# Patient Record
Sex: Male | Born: 1935 | Race: Black or African American | Hispanic: No | Marital: Married | Smoking: Former smoker
Health system: Southern US, Community
[De-identification: ages and names within clinical notes are randomized; demographics above are authoritative.]

## PROBLEM LIST (undated history)

## (undated) DIAGNOSIS — I499 Cardiac arrhythmia, unspecified: Secondary | ICD-10-CM

## (undated) DIAGNOSIS — C61 Malignant neoplasm of prostate: Secondary | ICD-10-CM

## (undated) DIAGNOSIS — E119 Type 2 diabetes mellitus without complications: Secondary | ICD-10-CM

## (undated) DIAGNOSIS — K449 Diaphragmatic hernia without obstruction or gangrene: Secondary | ICD-10-CM

## (undated) DIAGNOSIS — Z950 Presence of cardiac pacemaker: Secondary | ICD-10-CM

## (undated) DIAGNOSIS — K648 Other hemorrhoids: Secondary | ICD-10-CM

## (undated) DIAGNOSIS — Z7982 Long term (current) use of aspirin: Secondary | ICD-10-CM

## (undated) DIAGNOSIS — R0600 Dyspnea, unspecified: Secondary | ICD-10-CM

## (undated) DIAGNOSIS — K219 Gastro-esophageal reflux disease without esophagitis: Secondary | ICD-10-CM

## (undated) HISTORY — PX: BACK SURGERY: SHX140

## (undated) HISTORY — DX: Other hemorrhoids: K64.8

## (undated) HISTORY — DX: Diaphragmatic hernia without obstruction or gangrene: K44.9

## (undated) HISTORY — DX: Malignant neoplasm of prostate: C61

## (undated) HISTORY — DX: Long term (current) use of aspirin: Z79.82

## (undated) HISTORY — DX: Type 2 diabetes mellitus without complications: E11.9

## (undated) HISTORY — PX: PROSTATE SURGERY: SHX751

---

## 2001-09-29 ENCOUNTER — Ambulatory Visit (HOSPITAL_COMMUNITY): Admission: RE | Admit: 2001-09-29 | Discharge: 2001-09-29 | Payer: Self-pay | Admitting: General Surgery

## 2002-04-16 ENCOUNTER — Encounter: Admission: RE | Admit: 2002-04-16 | Discharge: 2002-07-15 | Payer: Self-pay | Admitting: Family Medicine

## 2002-08-12 ENCOUNTER — Ambulatory Visit (HOSPITAL_COMMUNITY): Admission: RE | Admit: 2002-08-12 | Discharge: 2002-08-12 | Payer: Self-pay | Admitting: Family Medicine

## 2004-03-16 ENCOUNTER — Other Ambulatory Visit: Admission: RE | Admit: 2004-03-16 | Discharge: 2004-03-16 | Payer: Self-pay | Admitting: General Surgery

## 2007-10-13 DIAGNOSIS — K648 Other hemorrhoids: Secondary | ICD-10-CM

## 2007-10-13 HISTORY — PX: UPPER GASTROINTESTINAL ENDOSCOPY: SHX188

## 2007-10-13 HISTORY — PX: COLONOSCOPY: SHX174

## 2007-10-13 HISTORY — DX: Other hemorrhoids: K64.8

## 2007-10-14 ENCOUNTER — Inpatient Hospital Stay (HOSPITAL_COMMUNITY): Admission: EM | Admit: 2007-10-14 | Discharge: 2007-10-22 | Payer: Self-pay | Admitting: Emergency Medicine

## 2007-10-15 ENCOUNTER — Ambulatory Visit: Payer: Self-pay | Admitting: Gastroenterology

## 2007-10-16 ENCOUNTER — Ambulatory Visit: Payer: Self-pay | Admitting: Gastroenterology

## 2007-10-16 ENCOUNTER — Encounter: Payer: Self-pay | Admitting: Gastroenterology

## 2007-10-18 ENCOUNTER — Ambulatory Visit: Payer: Self-pay | Admitting: Internal Medicine

## 2007-10-21 ENCOUNTER — Ambulatory Visit: Payer: Self-pay | Admitting: Gastroenterology

## 2007-11-19 ENCOUNTER — Ambulatory Visit: Payer: Self-pay | Admitting: Gastroenterology

## 2009-02-25 ENCOUNTER — Emergency Department (HOSPITAL_COMMUNITY): Admission: EM | Admit: 2009-02-25 | Discharge: 2009-02-25 | Payer: Self-pay | Admitting: Emergency Medicine

## 2011-03-27 NOTE — Discharge Summary (Signed)
NAME:  Jose Chase, BAGHERI                 ACCOUNT NO.:  [id number]   MEDICAL RECORD NO.:  [id number]          PATIENT TYPE:  INP   LOCATION:  A228                          FACILITY:  [facility]   PHYSICIAN:  Tesfaye D. Felecia Shelling, MD   DATE OF BIRTH:  03/22/1932   DATE OF ADMISSION:  10/14/2007  DATE OF DISCHARGE:  12/10/2008LH                               DISCHARGE SUMMARY   DISCHARGE DIAGNOSES:  1. Post colonoscopy rectal perforation.  2. Rectal bleeding secondary to hemorrhoids.  3. Diabetes mellitus.  4. Hyperlipidemia.  5. History of carcinoma of the prostate.  6. History of diskogenic disease.   DISCHARGE MEDICATIONS:  1. Metformin 500 mg p.o. b.i.d.  2. Lovastatin 20 mg p.o. daily.  3. Omeprazole 20 mg daily.  4. Multivitamin 1 tablet daily.  5. Augmentin 500 mg p.o. b.i.d. for 4 more days.   DISPOSITION:  The patient was discharged to home in stable condition.   HOSPITAL COURSE:  This is a 77 year old male patient with history of  multiple medical illnesses who was admitted due to rectal bleed.  He was  evaluated by GI and the patient was scheduled for EGD and colonoscopy.  The patient developed rectal perforation post colonoscopy.  He was  evaluated by Surgery and was closely followed by GI.  The patient was  treated only with IV antibiotics and close observation.  His rectal  perforation gradually sealed, patient improved and he was discharged to  home on oral antibiotics, to be followed by gastroenterologist..      Tesfaye D. Felecia Shelling, MD  Electronically Signed     TDF/MEDQ  D:  11/17/2007  T:  11/17/2007  Job:  [id number]

## 2011-03-27 NOTE — H&P (Signed)
Jose Chase, Jose Chase                 ACCOUNT NO.:  [id number]   MEDICAL RECORD NO.:  [id number]          PATIENT TYPE:  INP   LOCATION:  A228                          FACILITY:  [facility]   PHYSICIAN:  Edward L. Juanetta Gosling, M.D.DATE OF BIRTH:  July 30, 1932   DATE OF ADMISSION:  10/14/2007  DATE OF DISCHARGE:  LH                              HISTORY & PHYSICAL   Patient of Dr. Felecia Shelling.   REASON FOR ADMISSION:  GI bleeding.   HISTORY:  Jose Chase is a 77 year old African-American male who has had  abdominal discomfort for 2-3 weeks.  He saw Dr. Felecia Shelling in his office,  this morning, and was prescribed Prilosec.  This afternoon he went home,  had a bowel movement that had blood in it, it was bright red blood; and  he came to the emergency room because of concerns that he was having a  significant GI bleed.  He has no known history of any sort of ulcer  disease or any other abdominal problems.  He has not had any cramping.  He has not had any fever.  He has not had any chills.  He has not had  any melena.   PAST MEDICAL HISTORY POSITIVE FOR:  1. History of diabetes mellitus.  2. Back pain requiring surgery.  3. Hyperlipidemia.  4. Cancer of the prostate.   MEDICATIONS:  1. Metformin 500 mg b.i.d.  2. Aspirin 81 mg daily.  3. Lovastatin 20 mg daily.  4. Omeprazole 20 mg daily that he just started today.  5. Multiple vitamins.   SURGICAL HISTORY IS POSITIVE FOR:  1. Hernia repair.  2. TURP.  3. Cancer of the prostate.  4. Back surgery.   SOCIAL HISTORY:  He is married, lives at home with his family.  He does  not use any illicit drugs.  He does not drink any alcohol.  Does not  smoke.   FAMILY HISTORY:  Negative for any sort of stomach problems, as far as he  knows.  No known history of any sort of a colorectal cancer.  No history  of peptic ulcer disease.   REVIEW OF SYSTEMS:  Other than as mentioned is negative.   PHYSICAL EXAMINATION:  GENERAL:  Shows a well-developed,  well-nourished  male who is in no acute distress.  VITAL SIGNS:  Temperature 97, blood pressure 123/69, pulse 70,  respirations 20, O2 saturation 97%.  HEENT:  His pupils are reactive and conjunctive are pink.  Nose and  throat are clear.  NECK:  Supple without masses.  CHEST:  Clear without wheezes, rales, or rhonchi.  HEART:  Regular without murmur, gallop or rub.  ABDOMEN:  Soft.  He does not have any masses.  No tenderness.  Bowel  sounds are present and active.  EXTREMITIES:  Showed no edema.  CENTRAL NERVOUS SYSTEM:  Examination is grossly intact.  RECTAL:  He had a rectal exam in the emergency room.  I did not repeat  it, and was said to show some bright red blood.   LABORATORY WORK:  White count 6700, hemoglobin 14.6, platelets 308.  BMET shows electrolytes are normal.  Glucose 190.   ASSESSMENT:  He has GI bleeding which appears to the lower.  It does not  appear to be massive at this point.  He is hemodynamically stable.  I am  going to go ahead and admit him, put him on Protonix, and give him some  IV fluids, go ahead and type and cross him to be sure that we have got  that covered.  Check serum hemoglobin and hematocrit, and ask for GI  consultation in the morning.      Edward L. Juanetta Gosling, M.D.  Electronically Signed     ELH/MEDQ  D:  10/14/2007  T:  10/15/2007  Job:  [id number]

## 2011-03-27 NOTE — Consult Note (Signed)
Jose Chase, Jose Chase NO.:  [id number]   MEDICAL RECORD NO.:  [id number]          PATIENT TYPE:  INP   LOCATION:  A228                          FACILITY:  [facility]   PHYSICIAN:  Tilford Pillar, MD      DATE OF BIRTH:  [birth year]   DATE OF CONSULTATION:  10/16/2007  DATE OF DISCHARGE:                                 CONSULTATION   REASON FOR CONSULTATION:  Rectal perforation.   HISTORY OF PRESENT ILLNESS:  Patient is a 77 year old African American  male who had presented to [facility] with a history of rectal  bleeding.  He was admitted for work-up and evaluation including a  colonoscopy.  During his colonoscopy, it was noted that the patient had  a rectal perforation during retroflexing of the colonoscope.  This was  immediately identified and Endoclips were placed at that time.  Following the procedure, he did have a CT evaluation of the abdomen and  pelvis and was admitted for close monitoring, IV fluids, bowel rest and  antibiotics.  Currently, the patient is asymptomatic, is in some mild to  moderate discomfort in the pelvis.  He denies any diffuse abdominal  pain, no nausea, no vomiting, no fevers, no chills.   PAST MEDICAL HISTORY:  Pertinent for  1. Non-insulin-dependent diabetes mellitus.  2. Hypercholesterolemia.  3. Prior prostate cancer.  4. Chronic low back pain.   PAST SURGICAL HISTORY:  1. Prostatectomy.  2. Herniorrhaphy.  3. Back surgery.   MEDICATIONS:  Reviewed.  Current medications include metformin, aspirin,  omeprazole, __________ .  He is currently on Zosyn for antibiotic  therapy.   ALLERGIES:  No known drug allergies..   SOCIAL HISTORY:  No tobacco.  No alcohol use.  No recreational drug use.   PHYSICAL EXAMINATION:  VITAL SIGNS:  Temperature 96.5, heart rate 56,  respirations 16, blood pressure 111/67, he is 97% on room air.  GENERAL APPEARANCE:  Patient is a mildly obese African American male in  no acute distress.  He  is lying supine in his hospital bed.  He is alert  and oriented x3.  HEENT:  Pupils are equal, round and reactive to light.  Extraocular  movements are intact.  No conjunctival pallor is noted.  NECK:  Trachea is midline.  PULMONARY:  Unlabored respirations.  He is clear to auscultation  bilaterally.  CARDIOVASCULAR:  Regular rate and rhythm, 2+ bounding radial and femoral  pulses bilaterally.  ABDOMEN:  Positive bowel sounds.  His abdomen is soft and flat,  nondistended, mild to moderate suprapubic tenderness to deep palpation.  No peritoneal signs are elicited.  No hernias or masses.  No rebound, no  percussion tenderness.  EXTREMITIES:  Warm and dry.   PERTINENT LABORATORY AND RADIOGRAPHIC STUDIES:  CBC with white blood  cell count 8.2, hemoglobin 14.4, hematocrit 44.4, platelets 279.  Basic  metabolic panel with sodium 140, potassium 4, chloride 108, bicarb 26,  BUN 8, creatinine 1.04, blood glucose 131.   CT of the abdomen and pelvis demonstrated positive free air.  This is  localized around the  perirectal area as well as around some of the small-  bowel mesentery.  There is a small amount of contrast extravasation from  the rectal contrast near the area of question.  There is no extensive  free intraperitoneal fluid.   ASSESSMENT/PLAN:  Rectal perforation, iatrogenic.  At this point,  patient remains asymptomatic with no evidence of acute peritonitis.  We  will continue n.p.o. status with bowel rest, IV fluid hydration and  antibiotic therapy.  Will continue close monitoring of laboratory and  physical examination findings.  Should the patient develop any signs or  symptoms consistent with peritonitis including increase in temperature,  increased heart rate, increased white blood cell count or worsening  abdominal pain or development of peritoneal signs, it was discussed with  the patient that that would require an emergent operation with planned  operative repair of the  perforation.  Risks, benefits, and alternatives  of immediate operation were discussed with the patient and at this  point, we will continue with close monitoring.      Tilford Pillar, MD  Electronically Signed     BZ/MEDQ  D:  10/16/2007  T:  10/17/2007  Job:  [id number]   cc:   Kassie Mends, M.D.  [address]  [place] , [place] [address]

## 2011-03-27 NOTE — Assessment & Plan Note (Signed)
NAMEMarland Kitchen  Jose Chase, Jose Chase                  CHART#:  [id number]   DATE:  11/19/2007                       DOB:  04-09-1932   REFERRING PHYSICIAN:  Dr. Felecia Shelling.   PROBLEM LIST:  1. Rectal bleeding secondary to hemorrhoids.  2. Colonoscopy complicated by rectal perforation.  3. Diabetes.  4. Chronic aspirin use.  5. Large hiatal hernia   SUBJECTIVE:  Jose Chase is a 77 year old male who presents as a return  patient visit.  He had a colonoscopy in December 2008, which was  complicated by rectal perforation.  The rectal tear was closed with  resolution clamps, he was treated as a contained perforation.  He is  seen today in follow up.  He had an upper endoscopy as well, which  showed a large hiatal hernia.  He denies any abdominal pain with bowel  movements or rectal bleeding.  He is eating regular food.  He feels  better now than in December.  His appetite is good.   MEDICATIONS:  1. Multivitamin.  2. Metformin.  3. Aspirin.  4. Omeprazole.   OBJECTIVE:  Weight 184 pounds.  Height 6 feet.  Temperature 98.  Blood  pressure 140/80.  Pulse 60.  GENERAL:  He is in no apparent distress.  Alert and oriented x4.  LUNGS:  Clear to auscultation bilaterally.CARDIOVASCULAR EXAM:  Regular rhythm.  No murmur.  ABDOMEN:  Bowel sounds are present, soft, nontender, and non-distended.   ASSESSMENT:  Jose Chase is a 77 year old male who had a colonoscopy  complicated by a rectal perforation.  He has fully recovered.  His  rectal bleeding, now resolved, was secondary to hemorrhoids.  Thank you  for allowing me to see Jose Chase in consultation.  My recommendations  follow.   RECOMMENDATIONS:  1. Would have a follow up visit in 10 years to discuss the benefits      versus the risk of colonoscopy.  If he remains healthy, he could      possibly wait up to 15 years to have a      colonoscopy.  2. He may follow up with me sooner if he has any other GI issues.       Kassie Mends, M.D.  Electronically Signed     SM/MEDQ  D:  11/19/2007  T:  11/19/2007  Job:  [id number]   cc:   Tesfaye D. Felecia Shelling, MD

## 2011-03-27 NOTE — Op Note (Signed)
NAMEKEMUEL, [name]                 ACCOUNT NO.:  [id number]   MEDICAL RECORD NO.:  [id number]          PATIENT TYPE:  INP   LOCATION:  A228                          FACILITY:  [facility]   PHYSICIAN:  Kassie Mends, M.D.      DATE OF BIRTH:  1932-03-18   DATE OF PROCEDURE:  10/16/2007  DATE OF DISCHARGE:                               OPERATIVE REPORT   PROCEDURE PERFORMED:  1. Colonoscopy with three [employer] Resolution clips placed      secondary to deep mucosal tear in the rectum.  2. Esophagogastroduodenoscopy with cold forceps biopsy.   INDICATIONS FOR PROCEDURE:  Mr. Jose Chase is a 77 year old male with  rectal bleeding.  His last colonoscopy was in 2002.  He also has new  onset dyspepsia.   FINDINGS:  1. Small internal hemorrhoids, otherwise no polyps, masses,      inflammatory changes, diverticula or arteriovenous malformations.  2. When the retroflex view was attempted with the adult colonoscope, a      deep mucosal tear was created approximately 20 cm from the anal      verge.  Three [employer] Resolution clips were placed with      complete closure.  3. Normal esophagus without evidence of Barrett's, mass, erosions,      ulceration or stricture.  4. Large hiatal hernia.  5. Linear erosions seen in the body of the stomach without ulceration.      Biopsies obtained via cold forceps to evaluate for Helicobacter      pylori gastritis.  Otherwise normal stomach.  6. Normal duodenal bulb and second portion of the duodenum.   DIAGNOSES:  1. Rectal bleeding likely secondary to internal hemorrhoids.  2. Colonoscopy complication:  Mucosal rectal tear requiring closure      with resolution clips.  3. Large hiatal hernia.  4. Linear erosions seen in the body of the stomach:  Gastritis versus      superficial Cameron's ulcers.   RECOMMENDATIONS:  1. Patient should be n.p.o. except for meds and ice chips.  Will      obtain CT scan of the abdomen and pelvis with rectal  contrast.  2. Begin Zosyn 3.375 g IV every six hours.  3. Serial abdominal exam to evaluate for the presence of peritonitis.   MEDICATIONS:  1. Demerol 50 mg IV.  2. Versed 4 mg IV.   DESCRIPTION OF PROCEDURE:  Physical exam was performed and informed  consent was obtained from the patient after explaining the risks,  benefits and alternatives to the procedure.  The patient was connected  to the monitor and placed in the left lateral position.  Continuous  oxygen was provided by nasal cannula and IV medicine administered  through an indwelling cannula.  After administration of sedation and  rectal exam, the patient's rectum was intubated and scope was advanced  under direct visualization to the cecum.  The scope was subsequently  removed slowly by carefully examining the color, texture, anatomy,  integrity of the mucosa on the way out.  Following of the mucosal tear  with [employer]  clips, the patient's abdomen was soft,  nontender, nondistended without rebound or guarding.   After the colonoscopy, the patient's esophagus was intubated with a  diagnostic gastroscope and the scope was advanced under direct  visualization to the second portion of the duodenum.  The scope was  removed slowly by carefully examining the color, texture, anatomy of the  mucosa on the way out.  The patient was recovered in the endoscopy suite  and discharged to the floor in satisfactory condition.      Kassie Mends, M.D.  Electronically Signed     SM/MEDQ  D:  10/16/2007  T:  10/16/2007  Job:  [id number]   cc:   Tesfaye D. Felecia Shelling, MD  Fax: [phone]

## 2011-03-27 NOTE — Consult Note (Signed)
NAMEANSELM, Jose                 ACCOUNT NO.:  [id number]   MEDICAL RECORD NO.:  [id number]          PATIENT TYPE:  INP   LOCATION:  A228                          FACILITY:  [facility]   PHYSICIAN:  Kassie Mends, M.D.      DATE OF BIRTH:  15-Sep-1932   DATE OF CONSULTATION:  10/15/2007  DATE OF DISCHARGE:                                 CONSULTATION   REASON FOR CONSULTATION:  GI bleed.   REQUESTING PHYSICIAN:  Tesfaye D. Felecia Shelling, MD.   HISTORY OF PRESENT ILLNESS:  Patient is a 77 year old African American  male who presented with three to four episodes of painless bloody  stools.  He states he saw Dr. Felecia Shelling yesterday for epigastric pain.  He  was started on omeprazole.  He has had epigastric pain off and on for  about one month.  He states he usually notes it in the early morning but  not throughout the day.  Denies any association with food.  States his  bowel movements have been regular.  Yesterday, he had a normal stool in  the morning, although it was dark.  This was followed by several  episodes of loose stool which was dark with lots of bright red blood.  He really denies any black tarry stools.  His appetite has been good.  He denies any dysphagia, odynophagia, nausea or vomiting or heartburn.  Denies any weight loss.  No urinary symptoms.  He is on aspirin 81 mg  daily but no other NSAIDs.   HOME MEDICATIONS:  1. Metformin 500 mg b.i.d.  2. Aspirin 81 mg daily.  3. Lovastatin 20 mg daily.  4. Omeprazole 20 mg daily started yesterday.  5. Multivitamin daily.   ALLERGIES:  No known drug allergies.   PAST MEDICAL HISTORY:  1. Diabetes mellitus.  2. Chronic back pain.  3. History of prostate cancer, status post surgery in the remote past.  4. History of back surgery.  5. Knee surgery.  6. Hernia repair.  7. Hyperlipidemia.  8. He had a colonoscopy by Dr. Katrinka Blazing in 2002, which revealed few      scattered diverticula and small internal hemorrhoids.   FAMILY HISTORY:  Brother  had prostate cancer, CABG and is on  hemodialysis.  No family history of colorectal cancer.   SOCIAL HISTORY:  He is married.  He has one child.  No tobacco or  alcohol use.  He is a Visual merchandiser and raises beef cattle.   REVIEW OF SYSTEMS:  See HPI for GI and CONSTITUTIONAL and GU.  CARDIOPULMONARY:  He denies chest pain, shortness of breath,  palpitations, dyspnea on exertion, no chronic cough.   PHYSICAL EXAMINATION:  VITAL SIGNS:  Temperature 98.5, pulse 57,  respirations 20, blood pressure 124/71, O2 saturation 97% on room air.  Height 73 inches, weight 81.3 kg.  GENERAL APPEARANCE:  A pleasant, elderly black male in no acute  distress.  SKIN:  Warm and dry, no jaundice.  HEENT:  Sclerae are nonicteric.  Oropharyngeal mucosa moist and pink.  No lesions, erythema or exudate.  No lymphadenopathy or thyromegaly.  CHEST:  Lungs clear to auscultation.  CARDIOVASCULAR:  Regular rate and rhythm, normal S1 and S2, no murmurs,  rubs, or gallops.  ABDOMEN:  Positive bowel sounds.  Abdomen soft, nontender, nondistended,  no organomegaly or masses.  No rebound tenderness or guarding.  No  abdominal bruits or hernias.  RECTAL:  Exam done in the ER was reported to be nontender.  LOWER EXTREMITIES:  No edema.   LABORATORY DATA:  Hemoglobin on admission was 14.6, today is 13.9,  platelet count 308,000, white count 6.7.  Sodium 138, potassium 4.2, BUN  17, creatinine 1.19, glucose 190.  INR 0.9, PTT 20.   IMPRESSION:  Patient is a pleasant 77 year old gentleman who presented  with several episodes of painless hematochezia.  His hemoglobin and  hematocrit have been stable and remain in the normal range.  Suspect he  has had a trivial lower GI bleed, possibly diverticular versus  hemorrhoidal.  He really denies any black tarry stools and was  hemodynamically stable and stable  hemoglobin.  Doubt he has had a  significant upper gastrointestinal bleed.  He has had a one month  history of epigastric  discomfort, which is quite vague, occurs only in  the mornings.  Might be related to gastroesophageal reflux disease or  dyspepsia that really remains unclear at this time.   RECOMMENDATIONS:  1. Will discontinue serial H&H, check a CBC in the morning.  2. Will continue PPI therapy.  3. Will offer colonoscopy with EGD tomorrow.   I would like to thank Tesfaye D. Felecia Shelling, MD, for allowing [place] to take part  in the care of this patient.      Tana Coast, P.A.      Kassie Mends, M.D.  Electronically Signed    LL/MEDQ  D:  10/15/2007  T:  10/15/2007  Job:  [id number]

## 2011-03-30 NOTE — Discharge Summary (Signed)
   NAME:  Jose Chase, Jose Chase NO.:  [id number]   MEDICAL RECORD NO.:  [id number]                   PATIENT TYPE:  OUT   LOCATION:  RAD                                  FACILITY:  [facility]   PHYSICIAN:  [place] Bing, MD [facility]             DATE OF BIRTH:  Jan 21, 1932   DATE OF ADMISSION:  08/12/2002  DATE OF DISCHARGE:  08/12/2002                                 DISCHARGE SUMMARY   HOLTER MONITOR REPORT:   REFERRING PHYSICIAN:  Annia Friendly. Loleta Chance, M.D.   CLINICAL DATA:  A 76 year old gentleman with dizziness.   RESULTS:  1. Continuous electrocardiographic monitoring was maintained for 48 hours     during which the predominate rhythm was normal sinus with some sinus     bradycardia, as low as a rate of 50, and some sinus tachycardia.  2. Frequent supraventricular ectopics were reported, occurring at a mean     rate of 170 per hour.  There was very little documentation of this ectopy     and the available rhythm strips, it may be artifactual, as there was a     good deal of noise and signal drift on these tracings.  3. No ventricular ectopy was recorded.  4. No significant ST segment depression noted.  5. A diary of activity was returned, no symptoms were reported.   IMPRESSION:  Probably unremarkable continuous electrocardiographic tracing.  No symptoms reported during recording interval.  No important arrhythmias  identified.  Other findings as noted.                                               [place] Bing, MD [facility]    RR/MEDQ  D:  08/17/2002  T:  08/18/2002  Job:  [id number]

## 2011-05-21 ENCOUNTER — Encounter: Payer: Self-pay | Admitting: Gastroenterology

## 2011-05-21 ENCOUNTER — Ambulatory Visit (INDEPENDENT_AMBULATORY_CARE_PROVIDER_SITE_OTHER): Payer: Medicare Other | Admitting: Gastroenterology

## 2011-05-21 DIAGNOSIS — R1310 Dysphagia, unspecified: Secondary | ICD-10-CM | POA: Insufficient documentation

## 2011-05-21 NOTE — Assessment & Plan Note (Addendum)
Most likely to ESo ring, LESS LIKELY ESO CA OR CA AT GE JXN, OR ESO MOTILITY DISORDER.  EGD/?DIL 7/17. If ring identified, consider changing PPI. HOLD GLIPIZIDE ON AM OF PROCEDURE. BASW if no definite ring identified. OPV IN 4 MOS.

## 2011-05-21 NOTE — Progress Notes (Signed)
  Subjective:    Patient ID: Jose Chase, male    DOB: May 26, 1932, 77 y.o.   MRN: [id number]  PCP: Felecia Shelling  HPI Has the hiccups-lasts hours. Trouble swallowing liquids/solids/pills-chicken breast, "too dry. It comes and goes. No heartburn or indigestion. Weight loss: 10 LBS SINCE 2009 & SBP: 140. Sometimes follows a diabetic diet. Doesn't remember HgA1C. No nausea or vomiting. Had a dizzy spell ~2 weeks ago. Bms: 1-2x/day. No pain in belly. Appetite: excellent. No abd pain. Sx same since beginning of the year. OMP taking it BID for about 2 years.    Past Medical History  Diagnosis Date  . Hemorrhoids, internal Dec 2008  . DM (diabetes mellitus)   . Hiatal hernia DEC 2008 LARGE  . Aspirin long-term use    Past Surgical History  Procedure Date  . Colonoscopy DEC 2008    COMPLICATED BY CONTAINED RECTAL PERFORATION  . Upper gastrointestinal endoscopy DEC 2008    [id number]:YNWGNF GASTRIC MUCOSA   No Known Allergies Current Outpatient Prescriptions  Medication Sig Dispense Refill  . acetaminophen (TYLENOL) 325 MG tablet Take 650 mg by mouth. Takes one occasionally       . aspirin 81 MG tablet Take 81 mg by mouth daily.        . Garlic TABS Take by mouth 1 day or 1 dose.        Marland Kitchen glipiZIDE (GLUCOTROL) 5 MG 24 hr tablet Take 5 mg by mouth daily.        . meclizine (ANTIVERT) 50 MG tablet Take 25 mg by mouth 3 (three) times daily as needed. As needed only       . metformin (FORTAMET) 500 MG (OSM) 24 hr tablet Take 500 mg by mouth daily with breakfast. Take two tablets twice daily       . Multiple Vitamin (MULTIVITAMIN) tablet Take 1 tablet by mouth daily.        Marland Kitchen omeprazole (PRILOSEC) 20 MG capsule Take 20 mg by mouth daily.        . simvastatin (ZOCOR) 20 MG tablet Take 20 mg by mouth at bedtime.         Family History  Problem Relation Age of Onset  . Colon cancer Neg Hx   . Colon polyps Neg Hx     Review of Systems     Objective:   Physical Exam  Vitals reviewed. Constitutional: He  is oriented to person, place, and time. He appears well-developed and well-nourished. No distress.  HENT:  Head: Normocephalic and atraumatic.  Mouth/Throat: No oropharyngeal exudate.  Neck: Normal range of motion. Neck supple. No thyromegaly present.  Cardiovascular: Normal rate, regular rhythm and normal heart sounds.   Pulmonary/Chest: Effort normal and breath sounds normal. He has no wheezes. He has no rales.  Abdominal: Soft. Bowel sounds are normal. He exhibits no distension. There is no tenderness.  Musculoskeletal: He exhibits no edema.  Lymphadenopathy:    He has no cervical adenopathy.  Neurological: He is alert and oriented to person, place, and time.  Psychiatric: He has a normal mood and affect.          Assessment & Plan:

## 2011-05-21 NOTE — Progress Notes (Signed)
Cc to PCP 

## 2011-05-29 ENCOUNTER — Other Ambulatory Visit: Payer: Self-pay | Admitting: Gastroenterology

## 2011-05-29 ENCOUNTER — Encounter: Payer: Medicare Other | Admitting: Gastroenterology

## 2011-05-29 ENCOUNTER — Ambulatory Visit (HOSPITAL_COMMUNITY)
Admission: RE | Admit: 2011-05-29 | Discharge: 2011-05-29 | Disposition: A | Discharge status: Home / Self Care | Payer: Medicare Other | Source: Ambulatory Visit | Attending: Gastroenterology | Admitting: Gastroenterology

## 2011-05-29 ENCOUNTER — Encounter (HOSPITAL_COMMUNITY)
Admission: RE | Disposition: A | Discharge status: Home / Self Care | Payer: Self-pay | Source: Ambulatory Visit | Attending: Gastroenterology

## 2011-05-29 DIAGNOSIS — R1310 Dysphagia, unspecified: Secondary | ICD-10-CM | POA: Insufficient documentation

## 2011-05-29 DIAGNOSIS — K2970 Gastritis, unspecified, without bleeding: Secondary | ICD-10-CM

## 2011-05-29 DIAGNOSIS — Z01812 Encounter for preprocedural laboratory examination: Secondary | ICD-10-CM | POA: Insufficient documentation

## 2011-05-29 DIAGNOSIS — K2990 Gastroduodenitis, unspecified, without bleeding: Secondary | ICD-10-CM

## 2011-05-29 DIAGNOSIS — K449 Diaphragmatic hernia without obstruction or gangrene: Secondary | ICD-10-CM | POA: Insufficient documentation

## 2011-05-29 DIAGNOSIS — Z7982 Long term (current) use of aspirin: Secondary | ICD-10-CM | POA: Insufficient documentation

## 2011-05-29 DIAGNOSIS — K2940 Chronic atrophic gastritis without bleeding: Secondary | ICD-10-CM | POA: Insufficient documentation

## 2011-05-29 DIAGNOSIS — E119 Type 2 diabetes mellitus without complications: Secondary | ICD-10-CM | POA: Insufficient documentation

## 2011-05-29 DIAGNOSIS — K222 Esophageal obstruction: Secondary | ICD-10-CM | POA: Insufficient documentation

## 2011-05-29 HISTORY — PX: MALONEY DILATION: SHX5535

## 2011-05-29 HISTORY — PX: ESOPHAGOGASTRODUODENOSCOPY: SHX5428

## 2011-05-29 HISTORY — PX: SAVORY DILATION: SHX5439

## 2011-05-29 LAB — GLUCOSE, CAPILLARY: Glucose-Capillary: 113 mg/dL — ABNORMAL HIGH (ref 70–99)

## 2011-05-29 SURGERY — EGD (ESOPHAGOGASTRODUODENOSCOPY)
Anesthesia: Moderate Sedation

## 2011-05-29 MED ORDER — MEPERIDINE HCL 100 MG/ML IJ SOLN
INTRAMUSCULAR | Status: DC | PRN
  Administered 2011-05-29 (×2): 25 mg via INTRAVENOUS

## 2011-05-29 MED ORDER — STERILE WATER FOR IRRIGATION IR SOLN
Status: DC | PRN
  Administered 2011-05-29: 12:00:00

## 2011-05-29 MED ORDER — MEPERIDINE HCL 100 MG/ML IJ SOLN
INTRAMUSCULAR | Status: AC
  Filled 2011-05-29: qty 2

## 2011-05-29 MED ORDER — MIDAZOLAM HCL 5 MG/5ML IJ SOLN
INTRAMUSCULAR | Status: DC | PRN
  Administered 2011-05-29: 2 mg via INTRAVENOUS

## 2011-05-29 MED ORDER — MIDAZOLAM HCL 5 MG/5ML IJ SOLN
INTRAMUSCULAR | Status: AC
  Filled 2011-05-29: qty 5

## 2011-05-29 MED ORDER — DEXLANSOPRAZOLE 60 MG PO CPDR: DELAYED_RELEASE_CAPSULE | ORAL | Status: DC

## 2011-05-29 MED ORDER — SODIUM CHLORIDE 0.45 % IV SOLN
Freq: Once | INTRAVENOUS | Status: AC
  Administered 2011-05-29: 11:00:00 via INTRAVENOUS

## 2011-05-29 NOTE — Interval H&P Note (Signed)
History and Physical Interval Note:   05/29/2011   11:43 AM   Jose Chase  has presented today for surgery, with the diagnosis of dysphagia  The various methods of treatment have been discussed with the patient and family. After consideration of risks, benefits and other options for treatment, the patient has consented to  Procedure(s): ESOPHAGOGASTRODUODENOSCOPY (EGD) MALONEY DILATION SAVORY DILATION as a surgical intervention .  I have reviewed the patients' chart and labs.  Questions were answered to the patient's satisfaction.     [name]  MD    No changes since last visit.

## 2011-05-29 NOTE — Progress Notes (Signed)
Savory Dilation 12, 12.8, 14-16

## 2011-05-29 NOTE — Interval H&P Note (Signed)
History and Physical Interval Note:   05/29/2011   11:43 AM   Jose Chase  has presented today for surgery, with the diagnosis of dysphagia  The various methods of treatment have been discussed with the patient and family. After consideration of risks, benefits and other options for treatment, the patient has consented to  Procedure(s): ESOPHAGOGASTRODUODENOSCOPY (EGD) MALONEY DILATION SAVORY DILATION as a surgical intervention .  I have reviewed the patients' chart and labs.  Questions were answered to the patient's satisfaction.     [name]  MD

## 2011-05-29 NOTE — H&P (Signed)
[name], MD  05/21/2011  3:17 PM  Signed    Subjective:      Patient ID: Jose Chase, male    DOB: Aug 30, 1932, 77 y.o.   MRN: [id number]   PCP: Felecia Shelling   HPI Has the hiccups-lasts hours. Trouble swallowing liquids/solids/pills-chicken breast, "too dry. It comes and goes. No heartburn or indigestion. Weight loss: 10 LBS SINCE 2009 & SBP: 140. Sometimes follows a diabetic diet. Doesn't remember HgA1C. No nausea or vomiting. Had a dizzy spell ~2 weeks ago. Bms: 1-2x/day. No pain in belly. Appetite: excellent. No abd pain. Sx same since beginning of the year. OMP taking it BID for about 2 years.       Past Medical History   Diagnosis  Date   .  Hemorrhoids, internal  Dec 2008   .  DM (diabetes mellitus)     .  Hiatal hernia  DEC 2008 LARGE   .  Aspirin long-term use      Past Surgical History   Procedure  Date   .  Colonoscopy  DEC 2008       COMPLICATED BY CONTAINED RECTAL PERFORATION   .  Upper gastrointestinal endoscopy  DEC 2008       YN:WGNFAO GASTRIC MUCOSA    No Known Allergies Current Outpatient Prescriptions   Medication  Sig  Dispense  Refill   .  acetaminophen (TYLENOL) 325 MG tablet  Take 650 mg by mouth. Takes one occasionally          .  aspirin 81 MG tablet  Take 81 mg by mouth daily.           .  Garlic TABS  Take by mouth 1 day or 1 dose.           Marland Kitchen  glipiZIDE (GLUCOTROL) 5 MG 24 hr tablet  Take 5 mg by mouth daily.           .  meclizine (ANTIVERT) 50 MG tablet  Take 25 mg by mouth 3 (three) times daily as needed. As needed only          .  metformin (FORTAMET) 500 MG (OSM) 24 hr tablet  Take 500 mg by mouth daily with breakfast. Take two tablets twice daily          .  Multiple Vitamin (MULTIVITAMIN) tablet  Take 1 tablet by mouth daily.           Marland Kitchen  omeprazole (PRILOSEC) 20 MG capsule  Take 20 mg by mouth daily.           .  simvastatin (ZOCOR) 20 MG tablet  Take 20 mg by mouth at bedtime.            Family History   Problem  Relation  Age of Onset   .   Colon cancer  Neg Hx     .  Colon polyps  Neg Hx        Review of Systems     Objective:    Physical Exam  Vitals reviewed. Constitutional: He is oriented to person, place, and time. He appears well-developed and well-nourished. No distress.  HENT:   Head: Normocephalic and atraumatic.   Mouth/Throat: No oropharyngeal exudate.  Neck: Normal range of motion. Neck supple. No thyromegaly present.  Cardiovascular: Normal rate, regular rhythm and normal heart sounds.   Pulmonary/Chest: Effort normal and breath sounds normal. He has no wheezes. He has no rales.  Abdominal: Soft. Bowel sounds are normal. He  exhibits no distension. There is no tenderness.  Musculoskeletal: He exhibits no edema.  Lymphadenopathy:    He has no cervical adenopathy.  Neurological: He is alert and oriented to person, place, and time.  Psychiatric: He has a normal mood and affect.           Assessment & Plan:        Glendora Score  05/21/2011  4:44 PM  Signed Cc to PCP        Dysphagia - [name], MD  05/21/2011  3:16 PM  Addendum Most likely to ESo ring, LESS LIKELY ESO CA OR CA AT GE JXN, OR ESO MOTILITY DISORDER.   EGD/?DIL 7/17. If ring identified, consider changing PPI. HOLD GLIPIZIDE ON AM OF PROCEDURE. BASW if no definite ring identified. OPV IN 4 MOS.

## 2011-05-30 NOTE — Progress Notes (Signed)
Reminder in epic °

## 2011-05-31 ENCOUNTER — Ambulatory Visit (INDEPENDENT_AMBULATORY_CARE_PROVIDER_SITE_OTHER): Payer: Medicare Other | Admitting: Otolaryngology

## 2011-05-31 DIAGNOSIS — R42 Dizziness and giddiness: Secondary | ICD-10-CM

## 2011-05-31 DIAGNOSIS — H814 Vertigo of central origin: Secondary | ICD-10-CM

## 2011-06-01 ENCOUNTER — Other Ambulatory Visit (INDEPENDENT_AMBULATORY_CARE_PROVIDER_SITE_OTHER): Payer: Self-pay | Admitting: Otolaryngology

## 2011-06-01 DIAGNOSIS — R42 Dizziness and giddiness: Secondary | ICD-10-CM

## 2011-06-05 ENCOUNTER — Ambulatory Visit (HOSPITAL_COMMUNITY): Payer: Medicare Other

## 2011-06-05 ENCOUNTER — Telehealth: Payer: Self-pay | Admitting: Gastroenterology

## 2011-06-05 NOTE — Telephone Encounter (Signed)
Please call pt. His stomach biopsies show mild gastritis. Continue Dexilant. Call em in 2 os if his swallowing is still a problems. OPV NOV 2012, Dx: dysphagia, GERD.

## 2011-06-06 ENCOUNTER — Telehealth: Payer: Self-pay

## 2011-06-06 ENCOUNTER — Ambulatory Visit (HOSPITAL_COMMUNITY)
Admission: RE | Admit: 2011-06-06 | Discharge: 2011-06-06 | Disposition: A | Discharge status: Home / Self Care | Payer: Medicare Other | Source: Ambulatory Visit | Attending: Otolaryngology | Admitting: Otolaryngology

## 2011-06-06 DIAGNOSIS — R42 Dizziness and giddiness: Secondary | ICD-10-CM

## 2011-06-06 MED ORDER — GADOBENATE DIMEGLUMINE 529 MG/ML IV SOLN
16.0000 mL | Freq: Once | INTRAVENOUS | Status: AC | PRN
  Administered 2011-06-06: 16 mL via INTRAVENOUS

## 2011-06-06 NOTE — Telephone Encounter (Signed)
Cc path to PCP and f/u opv is in the computer

## 2011-06-06 NOTE — Telephone Encounter (Signed)
Pt came by and was given results of his path. See Dr. Darrick Penna phone note of 06/05/2011.

## 2011-06-06 NOTE — Telephone Encounter (Signed)
LM for pt to call

## 2011-06-07 ENCOUNTER — Encounter (HOSPITAL_COMMUNITY): Payer: Self-pay | Admitting: Gastroenterology

## 2011-06-07 NOTE — Telephone Encounter (Signed)
Reminder in epic to follow up in 3 months °

## 2011-06-07 NOTE — Telephone Encounter (Signed)
Pt came by the office and was informed.  

## 2011-06-28 ENCOUNTER — Ambulatory Visit (INDEPENDENT_AMBULATORY_CARE_PROVIDER_SITE_OTHER): Payer: Medicare Other | Admitting: Otolaryngology

## 2011-06-28 DIAGNOSIS — R42 Dizziness and giddiness: Secondary | ICD-10-CM

## 2011-08-07 ENCOUNTER — Encounter: Payer: Self-pay | Admitting: Gastroenterology

## 2011-08-20 LAB — APTT: aPTT: 29

## 2011-08-20 LAB — BASIC METABOLIC PANEL
BUN: 11
Calcium: 8.6
Calcium: 9.1
Chloride: 107
Chloride: 107
Creatinine, Ser: 0.42
Creatinine, Ser: 1.04
Creatinine, Ser: 1.19
GFR calc Af Amer: >60
GFR calc Af Amer: >60
GFR calc Af Amer: >60
GFR calc non Af Amer: >60
GFR calc non Af Amer: >60
Glucose, Bld: 144 — ABNORMAL HIGH
Potassium: 4.2
Sodium: 141
Sodium: 143

## 2011-08-20 LAB — DIFFERENTIAL
Basophils Absolute: 0
Basophils Absolute: 0
Basophils Relative: 0
Basophils Relative: 0
Basophils Relative: 0
Basophils Relative: 0
Basophils Relative: 1
Eosinophils Absolute: 0 — ABNORMAL LOW
Eosinophils Absolute: 0.6
Eosinophils Relative: 0
Eosinophils Relative: 11 — ABNORMAL HIGH
Eosinophils Relative: 13 — ABNORMAL HIGH
Eosinophils Relative: 3
Eosinophils Relative: 6 — ABNORMAL HIGH
Lymphocytes Relative: 10 — ABNORMAL LOW
Lymphocytes Relative: 12
Lymphocytes Relative: 12
Lymphocytes Relative: 13
Lymphocytes Relative: 18
Lymphocytes Relative: 6 — ABNORMAL LOW
Lymphocytes Relative: 6 — ABNORMAL LOW
Lymphs Abs: 0.6 — ABNORMAL LOW
Lymphs Abs: 0.6 — ABNORMAL LOW
Lymphs Abs: 0.7
Lymphs Abs: 0.8
Lymphs Abs: 0.9
Monocytes Absolute: 0.4
Monocytes Absolute: 0.4
Monocytes Absolute: 0.6
Monocytes Absolute: 0.6
Monocytes Absolute: 0.7
Monocytes Absolute: 0.8
Monocytes Relative: 10
Monocytes Relative: 10
Monocytes Relative: 5
Monocytes Relative: 6
Neutro Abs: 4
Neutro Abs: 6
Neutro Abs: 6.2
Neutro Abs: 9.1 — ABNORMAL HIGH
Neutrophils Relative %: 58
Neutrophils Relative %: 65
Neutrophils Relative %: 74
Neutrophils Relative %: 87 — ABNORMAL HIGH
Neutrophils Relative %: 88 — ABNORMAL HIGH

## 2011-08-20 LAB — CBC
HCT: 42.8
HCT: 43.4
HCT: 43.6
HCT: 45.4
Hemoglobin: 13.8
Hemoglobin: 14.2
Hemoglobin: 14.4
Hemoglobin: 14.7
MCHC: 32.3
MCHC: 32.3
MCHC: 32.8
MCHC: 33.1
MCV: 81.5
MCV: 81.7
MCV: 82.2
Platelets: 252
Platelets: 264
Platelets: 308
Platelets: 333
RBC: 5.25
RBC: 5.31
RBC: 5.44
RBC: 5.51
RDW: 13.8
RDW: 13.9
RDW: 14.1
RDW: 14.4
WBC: 10.4
WBC: 6.1
WBC: 6.2
WBC: 6.7
WBC: 8.2

## 2011-08-20 LAB — PROTIME-INR: INR: 0.9

## 2011-08-20 LAB — COMPREHENSIVE METABOLIC PANEL
ALT: 15
ALT: 17
AST: 20
AST: 21
Albumin: 2.7 — ABNORMAL LOW
Albumin: 2.7 — ABNORMAL LOW
Albumin: 2.7 — ABNORMAL LOW
Alkaline Phosphatase: 52
Alkaline Phosphatase: 61
BUN: 13
BUN: 9
CO2: 28
Calcium: 8.3 — ABNORMAL LOW
Calcium: 8.3 — ABNORMAL LOW
Calcium: 8.4
Calcium: 8.7
Chloride: 107
Creatinine, Ser: 1.23
Creatinine, Ser: 1.34
GFR calc Af Amer: >60
GFR calc Af Amer: >60
GFR calc non Af Amer: 52 — ABNORMAL LOW
Glucose, Bld: 150 — ABNORMAL HIGH
Glucose, Bld: 176 — ABNORMAL HIGH
Potassium: 3.8
Potassium: 3.9
Sodium: 140
Sodium: 142
Total Bilirubin: 1.4 — ABNORMAL HIGH
Total Protein: 5.2 — ABNORMAL LOW
Total Protein: 5.6 — ABNORMAL LOW
Total Protein: 6.1
Total Protein: 6.4

## 2011-08-20 LAB — HEMOGLOBIN AND HEMATOCRIT, BLOOD
HCT: 44.2
HCT: 44.9
Hemoglobin: 14.4
Hemoglobin: 14.4

## 2011-08-20 LAB — PHOSPHORUS
Phosphorus: 3.5
Phosphorus: 3.6

## 2011-08-20 LAB — PREALBUMIN
Prealbumin: 11.6 — ABNORMAL LOW
Prealbumin: 9.5 — ABNORMAL LOW

## 2011-08-20 LAB — TRIGLYCERIDES
Triglycerides: 100
Triglycerides: 76

## 2011-08-20 LAB — CHOLESTEROL, TOTAL
Cholesterol: 132
Cholesterol: 149

## 2011-08-20 LAB — MAGNESIUM
Magnesium: 1.7
Magnesium: 2.1

## 2011-08-20 LAB — TYPE AND SCREEN: Antibody Screen: NEGATIVE

## 2011-08-20 LAB — BILIRUBIN, DIRECT: Bilirubin, Direct: 0.2

## 2012-01-14 DIAGNOSIS — E119 Type 2 diabetes mellitus without complications: Secondary | ICD-10-CM | POA: Diagnosis not present

## 2012-01-14 DIAGNOSIS — E7800 Pure hypercholesterolemia, unspecified: Secondary | ICD-10-CM | POA: Diagnosis not present

## 2012-04-22 DIAGNOSIS — E119 Type 2 diabetes mellitus without complications: Secondary | ICD-10-CM | POA: Diagnosis not present

## 2012-04-22 DIAGNOSIS — I10 Essential (primary) hypertension: Secondary | ICD-10-CM | POA: Diagnosis not present

## 2012-04-22 DIAGNOSIS — E7800 Pure hypercholesterolemia, unspecified: Secondary | ICD-10-CM | POA: Diagnosis not present

## 2012-07-22 DIAGNOSIS — Z23 Encounter for immunization: Secondary | ICD-10-CM | POA: Diagnosis not present

## 2012-07-22 DIAGNOSIS — E7800 Pure hypercholesterolemia, unspecified: Secondary | ICD-10-CM | POA: Diagnosis not present

## 2012-07-22 DIAGNOSIS — M549 Dorsalgia, unspecified: Secondary | ICD-10-CM | POA: Diagnosis not present

## 2012-07-22 DIAGNOSIS — E119 Type 2 diabetes mellitus without complications: Secondary | ICD-10-CM | POA: Diagnosis not present

## 2012-08-06 ENCOUNTER — Ambulatory Visit (HOSPITAL_COMMUNITY)
Admission: RE | Admit: 2012-08-06 | Discharge: 2012-08-06 | Disposition: A | Discharge status: Home / Self Care | Payer: Medicare Other | Source: Ambulatory Visit | Attending: Internal Medicine | Admitting: Internal Medicine

## 2012-08-06 ENCOUNTER — Other Ambulatory Visit (HOSPITAL_COMMUNITY): Payer: Self-pay | Admitting: Internal Medicine

## 2012-08-06 DIAGNOSIS — M549 Dorsalgia, unspecified: Secondary | ICD-10-CM | POA: Diagnosis not present

## 2012-08-06 DIAGNOSIS — M5137 Other intervertebral disc degeneration, lumbosacral region: Secondary | ICD-10-CM | POA: Diagnosis not present

## 2012-08-06 DIAGNOSIS — M4120 Other idiopathic scoliosis, site unspecified: Secondary | ICD-10-CM | POA: Diagnosis not present

## 2012-08-06 DIAGNOSIS — M79606 Pain in leg, unspecified: Secondary | ICD-10-CM

## 2012-08-06 DIAGNOSIS — M51379 Other intervertebral disc degeneration, lumbosacral region without mention of lumbar back pain or lower extremity pain: Secondary | ICD-10-CM | POA: Insufficient documentation

## 2012-08-06 DIAGNOSIS — M79609 Pain in unspecified limb: Secondary | ICD-10-CM | POA: Insufficient documentation

## 2012-08-15 ENCOUNTER — Other Ambulatory Visit (HOSPITAL_COMMUNITY): Payer: Self-pay | Admitting: Internal Medicine

## 2012-08-15 DIAGNOSIS — E119 Type 2 diabetes mellitus without complications: Secondary | ICD-10-CM | POA: Diagnosis not present

## 2012-08-15 DIAGNOSIS — M549 Dorsalgia, unspecified: Secondary | ICD-10-CM | POA: Diagnosis not present

## 2012-08-15 DIAGNOSIS — M5416 Radiculopathy, lumbar region: Secondary | ICD-10-CM

## 2012-08-19 ENCOUNTER — Ambulatory Visit (HOSPITAL_COMMUNITY)
Admission: RE | Admit: 2012-08-19 | Discharge: 2012-08-19 | Disposition: A | Discharge status: Home / Self Care | Payer: Medicare Other | Source: Ambulatory Visit | Attending: Internal Medicine | Admitting: Internal Medicine

## 2012-08-19 DIAGNOSIS — M545 Low back pain, unspecified: Secondary | ICD-10-CM | POA: Diagnosis not present

## 2012-08-19 DIAGNOSIS — M5126 Other intervertebral disc displacement, lumbar region: Secondary | ICD-10-CM | POA: Insufficient documentation

## 2012-08-19 DIAGNOSIS — M48061 Spinal stenosis, lumbar region without neurogenic claudication: Secondary | ICD-10-CM | POA: Diagnosis not present

## 2012-08-19 DIAGNOSIS — M5416 Radiculopathy, lumbar region: Secondary | ICD-10-CM

## 2012-08-19 DIAGNOSIS — R209 Unspecified disturbances of skin sensation: Secondary | ICD-10-CM | POA: Insufficient documentation

## 2012-08-22 DIAGNOSIS — M549 Dorsalgia, unspecified: Secondary | ICD-10-CM | POA: Diagnosis not present

## 2012-09-10 DIAGNOSIS — M5126 Other intervertebral disc displacement, lumbar region: Secondary | ICD-10-CM | POA: Diagnosis not present

## 2012-10-07 DIAGNOSIS — M545 Low back pain, unspecified: Secondary | ICD-10-CM | POA: Diagnosis not present

## 2012-10-07 DIAGNOSIS — IMO0002 Reserved for concepts with insufficient information to code with codable children: Secondary | ICD-10-CM | POA: Diagnosis not present

## 2012-10-07 DIAGNOSIS — M5137 Other intervertebral disc degeneration, lumbosacral region: Secondary | ICD-10-CM | POA: Diagnosis not present

## 2012-10-21 DIAGNOSIS — E7800 Pure hypercholesterolemia, unspecified: Secondary | ICD-10-CM | POA: Diagnosis not present

## 2012-10-21 DIAGNOSIS — E119 Type 2 diabetes mellitus without complications: Secondary | ICD-10-CM | POA: Diagnosis not present

## 2012-11-11 DIAGNOSIS — M5137 Other intervertebral disc degeneration, lumbosacral region: Secondary | ICD-10-CM | POA: Diagnosis not present

## 2012-11-11 DIAGNOSIS — IMO0002 Reserved for concepts with insufficient information to code with codable children: Secondary | ICD-10-CM | POA: Diagnosis not present

## 2012-11-11 DIAGNOSIS — M545 Low back pain, unspecified: Secondary | ICD-10-CM | POA: Diagnosis not present

## 2012-12-09 DIAGNOSIS — M545 Low back pain, unspecified: Secondary | ICD-10-CM | POA: Diagnosis not present

## 2013-01-20 DIAGNOSIS — E119 Type 2 diabetes mellitus without complications: Secondary | ICD-10-CM | POA: Diagnosis not present

## 2013-01-20 DIAGNOSIS — I10 Essential (primary) hypertension: Secondary | ICD-10-CM | POA: Diagnosis not present

## 2013-01-20 DIAGNOSIS — IMO0002 Reserved for concepts with insufficient information to code with codable children: Secondary | ICD-10-CM | POA: Diagnosis not present

## 2013-02-20 DIAGNOSIS — E119 Type 2 diabetes mellitus without complications: Secondary | ICD-10-CM | POA: Diagnosis not present

## 2013-02-20 DIAGNOSIS — I10 Essential (primary) hypertension: Secondary | ICD-10-CM | POA: Diagnosis not present

## 2013-04-21 DIAGNOSIS — E119 Type 2 diabetes mellitus without complications: Secondary | ICD-10-CM | POA: Diagnosis not present

## 2013-04-21 DIAGNOSIS — K219 Gastro-esophageal reflux disease without esophagitis: Secondary | ICD-10-CM | POA: Diagnosis not present

## 2013-04-21 DIAGNOSIS — I10 Essential (primary) hypertension: Secondary | ICD-10-CM | POA: Diagnosis not present

## 2013-07-23 DIAGNOSIS — I10 Essential (primary) hypertension: Secondary | ICD-10-CM | POA: Diagnosis not present

## 2013-07-23 DIAGNOSIS — E119 Type 2 diabetes mellitus without complications: Secondary | ICD-10-CM | POA: Diagnosis not present

## 2013-07-23 DIAGNOSIS — Z23 Encounter for immunization: Secondary | ICD-10-CM | POA: Diagnosis not present

## 2013-07-23 DIAGNOSIS — M549 Dorsalgia, unspecified: Secondary | ICD-10-CM | POA: Diagnosis not present

## 2013-09-07 ENCOUNTER — Other Ambulatory Visit (HOSPITAL_COMMUNITY): Payer: Self-pay | Admitting: "Endocrinology

## 2013-09-07 DIAGNOSIS — I10 Essential (primary) hypertension: Secondary | ICD-10-CM | POA: Diagnosis not present

## 2013-09-07 DIAGNOSIS — R1310 Dysphagia, unspecified: Secondary | ICD-10-CM

## 2013-09-07 DIAGNOSIS — R42 Dizziness and giddiness: Secondary | ICD-10-CM | POA: Diagnosis not present

## 2013-09-07 DIAGNOSIS — E119 Type 2 diabetes mellitus without complications: Secondary | ICD-10-CM | POA: Diagnosis not present

## 2013-09-15 DIAGNOSIS — K228 Other specified diseases of esophagus: Secondary | ICD-10-CM | POA: Diagnosis not present

## 2013-09-15 DIAGNOSIS — R42 Dizziness and giddiness: Secondary | ICD-10-CM | POA: Diagnosis not present

## 2013-09-15 DIAGNOSIS — E119 Type 2 diabetes mellitus without complications: Secondary | ICD-10-CM | POA: Diagnosis not present

## 2013-09-15 DIAGNOSIS — K2289 Other specified disease of esophagus: Secondary | ICD-10-CM | POA: Diagnosis not present

## 2013-09-15 DIAGNOSIS — R1310 Dysphagia, unspecified: Secondary | ICD-10-CM | POA: Diagnosis not present

## 2013-09-17 ENCOUNTER — Encounter: Payer: Self-pay | Admitting: Gastroenterology

## 2013-09-18 ENCOUNTER — Other Ambulatory Visit (HOSPITAL_COMMUNITY): Payer: Self-pay | Admitting: Internal Medicine

## 2013-09-18 DIAGNOSIS — R1310 Dysphagia, unspecified: Secondary | ICD-10-CM

## 2013-09-28 ENCOUNTER — Inpatient Hospital Stay (HOSPITAL_COMMUNITY)
Admission: RE | Admit: 2013-09-28 | Discharge: 2013-09-28 | Disposition: A | Discharge status: Home / Self Care | Payer: Medicare Other | Source: Ambulatory Visit | Attending: Speech Pathology | Admitting: Speech Pathology

## 2013-09-28 ENCOUNTER — Other Ambulatory Visit (HOSPITAL_COMMUNITY): Payer: Medicare Other

## 2013-09-28 NOTE — Progress Notes (Signed)
SPEECH PATHOLOGY  SLP called Dr. Letitia Neri office to clarify whether pt's scheduled 10:30 AM appointment was for a modified barium swallow study or barium swallow, as order states barium swallow but was sent to speech pathology for scheduling. Dr. Letitia Neri assistant stated, "He doesn't need it, we are referring to GI." Reconsult if needed. Thank you,  Havery Moros, CCC-SLP [phone]

## 2013-10-15 ENCOUNTER — Encounter (INDEPENDENT_AMBULATORY_CARE_PROVIDER_SITE_OTHER): Payer: Self-pay

## 2013-10-15 ENCOUNTER — Ambulatory Visit (INDEPENDENT_AMBULATORY_CARE_PROVIDER_SITE_OTHER): Payer: Medicare Other | Admitting: Gastroenterology

## 2013-10-15 ENCOUNTER — Other Ambulatory Visit: Payer: Self-pay | Admitting: Gastroenterology

## 2013-10-15 ENCOUNTER — Encounter: Payer: Self-pay | Admitting: Gastroenterology

## 2013-10-15 VITALS — BP 135/67 | HR 80 | Temp 98.2°F | Ht 72.0 in | Wt 179.0 lb

## 2013-10-15 DIAGNOSIS — R1310 Dysphagia, unspecified: Secondary | ICD-10-CM

## 2013-10-15 DIAGNOSIS — R1319 Other dysphagia: Secondary | ICD-10-CM

## 2013-10-15 MED ORDER — OMEPRAZOLE 20 MG PO CPDR: DELAYED_RELEASE_CAPSULE | ORAL | Status: DC

## 2013-10-15 NOTE — Progress Notes (Signed)
Subjective:    Patient ID: Jose Chase, male    DOB: 1931-11-19, 79 y.o.   MRN: [id number]  HPI pilsl get hung. Solid foods a problem: [place] BREAST. Marland Kitchen Liquids not really a problem. MAY INDUCE VOMITING TO RELIEF FOODROM ESOPHAGUS. NOT TAKING PPI FOR ABOUT TWO YEARS. BMs: AT LEAST ONCE A DAY. IF GETS HUNGRY HE GETS ABD PAIN. TOOK IBUPROFEN WHEN HE WENT TO THE [facility]: 3 MOS AGO. PT DENIES FEVER, CHILLS, BRBPR, nausea, melena, diarrhea, constipation,problems with sedation, OR heartburn or indigestion. CONCERNED ABOUT NOISE FROM DRUM IN THE CHOIR STAND.  Past Medical History  Diagnosis Date  . Hemorrhoids, internal Dec 2008  . DM (diabetes mellitus)   . Hiatal hernia DEC 2008 LARGE  . Aspirin long-term use    Past Surgical History  Procedure Laterality Date  . Colonoscopy  DEC 2008    COMPLICATED BY CONTAINED RECTAL PERFORATION  . Upper gastrointestinal endoscopy  DEC 2008    NW:GNFAOZ GASTRIC MUCOSA  . Esophagogastroduodenoscopy  05/29/2011    Procedure: ESOPHAGOGASTRODUODENOSCOPY (EGD);  Surgeon: Arlyce Harman, MD;  [facility];  Service: Endoscopy;  Laterality: N/A;  Possible ED  . Maloney dilation  05/29/2011    Procedure: Elease Hashimoto DILATION;  Surgeon: Arlyce Harman, MD;  [facility];  Service: Endoscopy;  Laterality: N/A;  . Savory dilation  05/29/2011    Procedure: SAVORY DILATION;  Surgeon: Arlyce Harman, MD;  [facility]: [facility];  Service: Endoscopy;  Laterality: N/A;    No Known Allergies  Current Outpatient Prescriptions  Medication Sig Dispense Refill  . acetaminophen (TYLENOL) 325 MG tablet Take 650 mg by mouth. Takes one occasionally       . aspirin 81 MG tablet Take 81 mg by mouth daily.        Marland Kitchen glipiZIDE (GLUCOTROL) 5 MG 24 hr tablet Take 5 mg by mouth daily.        . metformin (FORTAMET) 500 MG (OSM) 24 hr tablet Take 500 mg by mouth daily with breakfast. Take two tablets twice daily      .        . simvastatin (ZOCOR) 20 MG tablet Take  20 mg by mouth at bedtime.        .      . Garlic TABS Take by mouth 1 day or 1 dose.        . meclizine (ANTIVERT) 50 MG tablet Take 25 mg by mouth 3 (three) times daily as needed. As needed only       . Multiple Vitamin (MULTIVITAMIN) tablet Take 1 tablet by mouth daily.         Family History  Problem Relation Age of Onset  . Colon cancer Neg Hx   . Colon polyps Neg Hx     History  Substance Use Topics  . Smoking status: Current Every Day Smoker    Types: Cigarettes  . Smokeless tobacco: Never Used     Comment: Never smoke very much  . Alcohol Use: Not on file    Review of Systems PER HPI OTHERWISE ALL SYSTEMS ARE NEGATIVE.    Objective:   Physical Exam  Vitals reviewed. Constitutional: He is oriented to person, place, and time. He appears well-nourished. No distress.  HENT:  Head: Normocephalic and atraumatic.  Mouth/Throat: Oropharynx is clear and moist.  Eyes: Pupils are equal, round, and reactive to light. No scleral icterus.  Neck: Normal range of motion. Neck supple.  Cardiovascular: Normal  rate, regular rhythm and normal heart sounds.   Pulmonary/Chest: Effort normal and breath sounds normal. No respiratory distress.  Abdominal: Soft. Bowel sounds are normal. He exhibits no distension. There is no tenderness.  Musculoskeletal: He exhibits no edema.  Lymphadenopathy:    He has no cervical adenopathy.  Neurological: He is alert and oriented to person, place, and time.  NO FOCAL DEFICITS   Psychiatric: He has a normal mood and affect.          Assessment & Plan:

## 2013-10-15 NOTE — Patient Instructions (Addendum)
RE-START OMEPRAZOLE.  TAKE 30 MINUTES PRIOR TO YOUR FIRST MEAL FOREVER.  CHECK WITH YOUR PHARMACY AND CRUSH PILLS IF POSSIBLE.  FOLLOW A SOFT MECHANICAL/DIABETIC DIET. SEE INFO BELOW ON A SOFT MECHANICAL DIET.  MEATS SHOULD BE CHOPPED OR GROUND ONLY. DO NOT EAT CHUNKS OF ANYTHING.  UPPER ENDOSCOPY TO STRETCH YOUR ESOPHAGUS ON Tuesday DEC 9.  FOLLOW UP IN 4 MOS.   SOFT MECHANICAL DIET This SOFT MECHANICAL DIET is restricted to:  Foods that are moist, soft-textured, and easy to chew and swallow.   Meats that are ground or are minced no larger than one-quarter inch pieces. Meats are moist with gravy or sauce added.   Foods that do not include bread or bread-like textures except soft pancakes, well-moistened with syrup or sauce.   Textures with some chewing ability required.   Casseroles without rice.   Cooked vegetables that are less than half an inch in size and easily mashed with a fork. No cooked corn, peas, broccoli, cauliflower, cabbage, Brussels sprouts, asparagus, or other fibrous, non-tender or rubbery cooked vegetables.   Canned fruit except for pineapple. Fruit must be cut into pieces no larger than half an inch in size.   Foods that do not include nuts, seeds, coconut, or sticky textures.   FOOD TEXTURES FOR DYSPHAGIA DIET LEVEL 2 -SOFT MECHANICAL DIET (includes all foods on Dysphagia Diet Level 1 - Pureed, in addition to the foods listed below)  FOOD GROUP: Breads. RECOMMENDED: Soft pancakes, well-moistened with syrup or sauce.  AVOID: All others.  FOOD GROUP: Cereals.  RECOMMENDED: Cooked cereals with little texture, including oatmeal. Unprocessed wheat bran stirred into cereals for bulk. Note: If thin liquids are restricted, it is important that all of the liquid is absorbed into the cereal.  AVOID: All dry cereals and any cooked cereals that may contain flax seeds or other seeds or nuts. Whole-grain, dry, or coarse cereals. Cereals with nuts, seeds, dried fruit,  and/or coconut.  FOOD GROUP: Desserts. RECOMMENDED: Pudding, custard. Soft fruit pies with bottom crust only. Canned fruit (excluding pineapple). Soft, moist cakes with icing.Frozen malts, milk shakes, frozen yogurt, eggnog, nutritional supplements, ice cream, sherbet, regular or sugar-free gelatin, or any foods that become thin liquid at either room (70 F) or body temperature (98 F).  AVOID: Dry, coarse cakes and cookies. Anything with nuts, seeds, coconut, pineapple, or dried fruit. Breakfast yogurt with nuts. Rice or bread pudding.  FOOD GROUP: Fats. RECOMMENDED: Butter, margarine, cream for cereal (depending on liquid consistency recommendations), gravy, cream sauces, sour cream, sour cream dips with soft additives, mayonnaise, salad dressings, cream cheese, cream cheese spreads with soft additives, whipped toppings.  AVOID: All fats with coarse or chunky additives.  FOOD GROUP: Fruits. RECOMMENDED: Soft drained, canned, or cooked fruits without seeds or skin. Fresh soft and ripe banana. Fruit juices with a small amount of pulp. If thin liquids are restricted, fruit juices should be thickened to appropriate consistency.  AVOID: Fresh or frozen fruits. Cooked fruit with skin or seeds. Dried fruits. Fresh, canned, or cooked pineapple.  FOOD GROUP: Meats and Meat Substitutes. (Meat pieces should not exceed 1/4 of an inch cube and should be tender.) RECOMMENDED: Moistened ground or cooked meat, poultry, or fish. Moist ground or tender meat may be served with gravy or sauce. Casseroles without rice. Moist macaroni and cheese, well-cooked pasta with meat sauce, tuna noodle casserole, soft, moist lasagna. Moist meatballs, meatloaf, or fish loaf. Protein salads, such as tuna or egg without large chunks, celery, or  onion. Cottage cheese, smooth quiche without large chunks. Poached, scrambled, or soft-cooked eggs (egg yolks should not be "runny" but should be moist and able to be mashed with butter,  margarine, or other moisture added to them). (Cook eggs to 160 F or use pasteurized eggs for safety.) Souffls may have small, soft chunks. Tofu. Well-cooked, slightly mashed, moist legumes, such as baked beans. All meats or protein substitutes should be served with sauces or moistened to help maintain cohesiveness in the oral cavity.  AVOID: Dry meats, tough meats (such as bacon, sausage, hot dogs, bratwurst). Dry casseroles or casseroles with rice or large chunks. Peanut butter. Cheese slices and cubes. Hard-cooked or crisp fried eggs. Sandwiches.Pizza.  FOOD GROUP: Potatoes and Starches. RECOMMENDED: Well-cooked, moistened, boiled, baked, or mashed potatoes. Well-cooked shredded hash brown potatoes that are not crisp. (All potatoes need to be moist and in sauces.)Well-cooked noodles in sauce. Spaetzel or soft dumplings that have been moistened with butter or gravy.  AVOID: Potato skins and chips. Fried or French-fried potatoes. Rice.  FOOD GROUP: Soups. RECOMMENDED: Soups with easy-to-chew or easy-to-swallow meats or vegetables: Particle sizes in soups should be less than 1/2 inch. Soups will need to be thickened to appropriate consistency if soup is thinner than prescribed liquid consistency.  AVOID: Soups with large chunks of meat and vegetables. Soups with rice, corn, peas.  FOOD GROUP: Vegetables. RECOMMENDED: All soft, well-cooked vegetables. Vegetables should be less than a half inch. Should be easily mashed with a fork.  AVOID: Cooked corn and peas. Broccoli, cabbage, Brussels sprouts, asparagus, or other fibrous, non-tender or rubbery cooked vegetables.  FOOD GROUP: Miscellaneous. RECOMMENDED: Jams and preserves without seeds, jelly. Sauces, salsas, etc., that may have small tender chunks less than 1/2 inch. Soft, smooth chocolate bars that are easily chewed.  AVOID: Seeds, nuts, coconut, or sticky foods. Chewy candies such as caramels or licorice.

## 2013-10-15 NOTE — Addendum Note (Signed)
Addended by: [place] on: 10/15/2013 03:49 PM   Modules accepted: Orders

## 2013-10-15 NOTE — Assessment & Plan Note (Addendum)
SX MOST LIKELY DUE TO RECURRENT ESO STRICTURE, LESS LIKELY ESO OR GASTRIC CA.  DAILY PPI CRUSH PILLS IF POSSIBLE SOFT MECHANICAL DIET.  MEATS SHOULD BE CHOPPED OR GROUND ONLY. DO NOT EAT CHUNKS OF ANYTHING. EGD.DIL DEC 9 OPV IN 4 MOS  USE EAR PLUGS IN THE CHOIR STAND TO PREVENT HEARING LOSS.

## 2013-10-19 NOTE — Progress Notes (Signed)
Reminder in epic °

## 2013-10-19 NOTE — Progress Notes (Signed)
cc'd to pcp 

## 2013-10-20 ENCOUNTER — Ambulatory Visit (HOSPITAL_COMMUNITY)
Admission: RE | Admit: 2013-10-20 | Discharge: 2013-10-20 | Disposition: A | Discharge status: Home / Self Care | Payer: Medicare Other | Source: Ambulatory Visit | Attending: Gastroenterology | Admitting: Gastroenterology

## 2013-10-20 ENCOUNTER — Encounter (HOSPITAL_COMMUNITY): Payer: Self-pay | Admitting: *Deleted

## 2013-10-20 ENCOUNTER — Encounter (HOSPITAL_COMMUNITY)
Admission: RE | Disposition: A | Discharge status: Home / Self Care | Payer: Self-pay | Source: Ambulatory Visit | Attending: Gastroenterology

## 2013-10-20 DIAGNOSIS — E119 Type 2 diabetes mellitus without complications: Secondary | ICD-10-CM | POA: Diagnosis not present

## 2013-10-20 DIAGNOSIS — K449 Diaphragmatic hernia without obstruction or gangrene: Secondary | ICD-10-CM

## 2013-10-20 DIAGNOSIS — Z01812 Encounter for preprocedural laboratory examination: Secondary | ICD-10-CM | POA: Insufficient documentation

## 2013-10-20 DIAGNOSIS — R1310 Dysphagia, unspecified: Secondary | ICD-10-CM | POA: Diagnosis not present

## 2013-10-20 DIAGNOSIS — K2940 Chronic atrophic gastritis without bleeding: Secondary | ICD-10-CM | POA: Diagnosis not present

## 2013-10-20 DIAGNOSIS — K222 Esophageal obstruction: Secondary | ICD-10-CM | POA: Insufficient documentation

## 2013-10-20 DIAGNOSIS — K2960 Other gastritis without bleeding: Secondary | ICD-10-CM | POA: Diagnosis not present

## 2013-10-20 DIAGNOSIS — K2970 Gastritis, unspecified, without bleeding: Secondary | ICD-10-CM | POA: Diagnosis not present

## 2013-10-20 DIAGNOSIS — R1319 Other dysphagia: Secondary | ICD-10-CM

## 2013-10-20 HISTORY — PX: ESOPHAGOGASTRODUODENOSCOPY (EGD) WITH ESOPHAGEAL DILATION: SHX5812

## 2013-10-20 SURGERY — ESOPHAGOGASTRODUODENOSCOPY (EGD) WITH ESOPHAGEAL DILATION
Anesthesia: Moderate Sedation

## 2013-10-20 MED ORDER — SODIUM CHLORIDE 0.9 % IV SOLN
INTRAVENOUS | Status: DC
  Administered 2013-10-20: 1000 mL via INTRAVENOUS

## 2013-10-20 MED ORDER — MIDAZOLAM HCL 5 MG/5ML IJ SOLN
INTRAMUSCULAR | Status: AC
  Filled 2013-10-20: qty 10

## 2013-10-20 MED ORDER — MEPERIDINE HCL 100 MG/ML IJ SOLN
INTRAMUSCULAR | Status: DC | PRN
  Administered 2013-10-20: 25 mg via INTRAVENOUS

## 2013-10-20 MED ORDER — MIDAZOLAM HCL 5 MG/5ML IJ SOLN
INTRAMUSCULAR | Status: DC | PRN
  Administered 2013-10-20: 1 mg via INTRAVENOUS
  Administered 2013-10-20: 2 mg via INTRAVENOUS

## 2013-10-20 MED ORDER — STERILE WATER FOR IRRIGATION IR SOLN
Status: DC | PRN
  Administered 2013-10-20: 15:00:00

## 2013-10-20 MED ORDER — MEPERIDINE HCL 100 MG/ML IJ SOLN
INTRAMUSCULAR | Status: AC
  Filled 2013-10-20: qty 2

## 2013-10-20 MED ORDER — BUTAMBEN-TETRACAINE-BENZOCAINE 2-2-14 % EX AERO
INHALATION_SPRAY | CUTANEOUS | Status: DC | PRN
  Administered 2013-10-20: 1 via TOPICAL

## 2013-10-20 NOTE — Op Note (Signed)
[facility] [address] [place], [address]   ENDOSCOPY PROCEDURE REPORT  PATIENT: Jose, Chase  MR#: [id number] BIRTHDATE: 1932-08-16 , 81  yrs. old GENDER: Male  ENDOSCOPIST: [name], MD REFFERED XB:[id number] Fanta, M.D. PROCEDURE DATE:  10/20/2013 PROCEDURE:   EGD with biopsy and EGD with dilatation over guidewire   INDICATIONS:1.  dysphagia. MEDICATIONS: Demerol 50 mg IV and Versed 3 mg IV TOPICAL ANESTHETIC: Cetacaine Spray  DESCRIPTION OF PROCEDURE:   After the risks benefits and alternatives of the procedure were thoroughly explained, informed consent was obtained.  The EG-[id number] ([id number])  endoscope was introduced through the mouth and advanced to the second portion of the duodenum. The instrument was slowly withdrawn as the mucosa was carefully examined.  Prior to withdrawal of the scope, the guidwire was placed.  The esophagus was dilated successfully.  The patient was recovered in endoscopy and discharged home in satisfactory condition.   ESOPHAGUS: A stricture was found at the gastroesophageal junction. The stenosis was traversable with the endoscope.   A medium sized hiatal hernia was noted.   STOMACH: Mild erosive gastritis (inflammation) was found in the gastric fundus and gastric antrum. Multiple biopsies were performed using cold forceps.   DUODENUM: The duodenal mucosa showed no abnormalities in the bulb and second portion of the duodenum.   Dilation was then performed at the gastroesphageal junction  Dilator: Savary over guidewire Size(s): 12.8-16 mm Resistance: minimal Heme: SLIGHT yes  COMPLICATIONS: There were no complications.   ENDOSCOPIC IMPRESSION: 1.   Stricture at the gastroesophageal junction 2.   Medium sized hiatal hernia 3.   MILD Erosive gastritis  RECOMMENDATIONS: CONTINUE OMEPRAZOLE.  TAKE 30 MINUTES PRIOR TO YOUR FIRST MEAL. FOLLOW A HIGH FIBER/LOW FAT DIET. BIOPSY WILL BE BACK IN 7 DAYS  FOLLOW UP IN APR  2014.      _______________________________ eSignedJonette Eva, MD 10/20/2013 3:57 PM

## 2013-10-20 NOTE — Interval H&P Note (Signed)
History and Physical Interval Note:  10/20/2013 12:44 PM  Jose Chase  has presented today for surgery, with the diagnosis of DYSPHAGIA  The various methods of treatment have been discussed with the patient and family. After consideration of risks, benefits and other options for treatment, the patient has consented to  Procedure(s) with comments: ESOPHAGOGASTRODUODENOSCOPY (EGD) WITH ESOPHAGEAL DILATION (N/A) - 12:15 as a surgical intervention .  The patient's history has been reviewed, patient examined, no change in status, stable for surgery.  I have reviewed the patient's chart and labs.  Questions were answered to the patient's satisfaction.     [employer]

## 2013-10-20 NOTE — H&P (View-Only) (Signed)
 Subjective:    Patient ID: Jose Chase, male    DOB: 05/16/1932, 79 y.o.   MRN: [id number]  HPI pilsl get hung. Solid foods a problem: [place] BREAST. . Liquids not really a problem. MAY INDUCE VOMITING TO RELIEF FOODROM ESOPHAGUS. NOT TAKING PPI FOR ABOUT TWO YEARS. BMs: AT LEAST ONCE A DAY. IF GETS HUNGRY HE GETS ABD PAIN. TOOK IBUPROFEN WHEN HE WENT TO THE DENTIST: 3 MOS AGO. PT DENIES FEVER, CHILLS, BRBPR, nausea, melena, diarrhea, constipation,problems with sedation, OR heartburn or indigestion. CONCERNED ABOUT NOISE FROM DRUM IN THE CHOIR STAND.  Past Medical History  Diagnosis Date  . Hemorrhoids, internal Dec 2008  . DM (diabetes mellitus)   . Hiatal hernia DEC 2008 LARGE  . Aspirin long-term use    Past Surgical History  Procedure Laterality Date  . Colonoscopy  DEC 2008    COMPLICATED BY CONTAINED RECTAL PERFORATION  . Upper gastrointestinal endoscopy  DEC 2008    Bx:BENIGN GASTRIC MUCOSA  . Esophagogastroduodenoscopy  05/29/2011    Procedure: ESOPHAGOGASTRODUODENOSCOPY (EGD);  Surgeon: Sandi M Fields, MD;  [facility];  Service: Endoscopy;  Laterality: N/A;  Possible ED  . Maloney dilation  05/29/2011    Procedure: MALONEY DILATION;  Surgeon: Sandi M Fields, MD;  [facility];  Service: Endoscopy;  Laterality: N/A;  . Savory dilation  05/29/2011    Procedure: SAVORY DILATION;  Surgeon: Sandi M Fields, MD;  [facility];  Service: Endoscopy;  Laterality: N/A;    No Known Allergies  Current Outpatient Prescriptions  Medication Sig Dispense Refill  . acetaminophen (TYLENOL) 325 MG tablet Take 650 mg by mouth. Takes one occasionally       . aspirin 81 MG tablet Take 81 mg by mouth daily.        . glipiZIDE (GLUCOTROL) 5 MG 24 hr tablet Take 5 mg by mouth daily.        . metformin (FORTAMET) 500 MG (OSM) 24 hr tablet Take 500 mg by mouth daily with breakfast. Take two tablets twice daily      .        . simvastatin (ZOCOR) 20 MG tablet Take  20 mg by mouth at bedtime.        .      . Garlic TABS Take by mouth 1 day or 1 dose.        . meclizine (ANTIVERT) 50 MG tablet Take 25 mg by mouth 3 (three) times daily as needed. As needed only       . Multiple Vitamin (MULTIVITAMIN) tablet Take 1 tablet by mouth daily.         Family History  Problem Relation Age of Onset  . Colon cancer Neg Hx   . Colon polyps Neg Hx     History  Substance Use Topics  . Smoking status: Current Every Day Smoker    Types: Cigarettes  . Smokeless tobacco: Never Used     Comment: Never smoke very much  . Alcohol Use: Not on file    Review of Systems PER HPI OTHERWISE ALL SYSTEMS ARE NEGATIVE.    Objective:   Physical Exam  Vitals reviewed. Constitutional: He is oriented to person, place, and time. He appears well-nourished. No distress.  HENT:  Head: Normocephalic and atraumatic.  Mouth/Throat: Oropharynx is clear and moist.  Eyes: Pupils are equal, round, and reactive to light. No scleral icterus.  Neck: Normal range of motion. Neck supple.  Cardiovascular: Normal   rate, regular rhythm and normal heart sounds.   Pulmonary/Chest: Effort normal and breath sounds normal. No respiratory distress.  Abdominal: Soft. Bowel sounds are normal. He exhibits no distension. There is no tenderness.  Musculoskeletal: He exhibits no edema.  Lymphadenopathy:    He has no cervical adenopathy.  Neurological: He is alert and oriented to person, place, and time.  NO FOCAL DEFICITS   Psychiatric: He has a normal mood and affect.          Assessment & Plan:   

## 2013-10-23 ENCOUNTER — Encounter (HOSPITAL_COMMUNITY): Payer: Self-pay | Admitting: Gastroenterology

## 2013-10-28 ENCOUNTER — Telehealth: Payer: Self-pay | Admitting: Gastroenterology

## 2013-10-28 NOTE — Telephone Encounter (Signed)
Please call pt. HIS stomach Bx shows gastritis.   CONTINUE OMEPRAZOLE.  TAKE 30 MINUTES PRIOR TO YOUR FIRST MEAL. FOLLOW A HIGH FIBER/LOW FAT DIET.  FOLLOW UP IN APR 2015.

## 2013-10-28 NOTE — Telephone Encounter (Signed)
Called and informed pts wife

## 2013-12-22 DIAGNOSIS — I10 Essential (primary) hypertension: Secondary | ICD-10-CM | POA: Diagnosis not present

## 2013-12-22 DIAGNOSIS — E7800 Pure hypercholesterolemia, unspecified: Secondary | ICD-10-CM | POA: Diagnosis not present

## 2013-12-22 DIAGNOSIS — E119 Type 2 diabetes mellitus without complications: Secondary | ICD-10-CM | POA: Diagnosis not present

## 2014-01-18 ENCOUNTER — Ambulatory Visit: Payer: Medicare Other | Admitting: Gastroenterology

## 2014-02-02 ENCOUNTER — Ambulatory Visit: Payer: Medicare Other | Admitting: Gastroenterology

## 2014-02-23 ENCOUNTER — Encounter (INDEPENDENT_AMBULATORY_CARE_PROVIDER_SITE_OTHER): Payer: Self-pay

## 2014-02-23 ENCOUNTER — Ambulatory Visit (INDEPENDENT_AMBULATORY_CARE_PROVIDER_SITE_OTHER): Payer: Medicare Other | Admitting: Gastroenterology

## 2014-02-23 ENCOUNTER — Encounter: Payer: Self-pay | Admitting: Gastroenterology

## 2014-02-23 VITALS — BP 95/53 | HR 53 | Temp 98.2°F | Ht 72.0 in | Wt 174.0 lb

## 2014-02-23 DIAGNOSIS — K2970 Gastritis, unspecified, without bleeding: Secondary | ICD-10-CM | POA: Insufficient documentation

## 2014-02-23 DIAGNOSIS — R1310 Dysphagia, unspecified: Secondary | ICD-10-CM

## 2014-02-23 DIAGNOSIS — K2990 Gastroduodenitis, unspecified, without bleeding: Secondary | ICD-10-CM | POA: Diagnosis not present

## 2014-02-23 NOTE — Assessment & Plan Note (Signed)
Doing well status post esophageal dilation of stricture in December. Continue PPI indefinitely.

## 2014-02-23 NOTE — Assessment & Plan Note (Signed)
Clinically doing well. Continue PPI. Office visit in one year.

## 2014-02-23 NOTE — Progress Notes (Signed)
Primary Care Physician: Rosita Fire, MD  Primary Gastroenterologist:  Barney Drain, MD   Chief Complaint  Patient presents with  . Follow-up    HPI: Jose Chase is a 80 y.o. male here for followup visit. He had an EGD with esophageal dilation in December 2014. 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 Pylori.  Reports that he's been doing well. Denies any difficulty swallowing. He has had some issues with postnasal drip and sinusitis due to allergies. Denies any heartburn, abdominal pain, constipation, diarrhea, melena, rectal bleeding. He is taking his PPI daily.    Current Outpatient Prescriptions  Medication Sig Dispense Refill  . acetaminophen (TYLENOL) 325 MG tablet Take 650 mg by mouth. Takes one occasionally       . aspirin 81 MG tablet Take 81 mg by mouth daily.        Marland Kitchen glipiZIDE (GLUCOTROL) 5 MG 24 hr tablet Take 5 mg by mouth daily.        . metformin (FORTAMET) 500 MG (OSM) 24 hr tablet Take 500 mg by mouth daily with breakfast. Take two tablets twice daily      . omeprazole (PRILOSEC) 20 MG capsule 1 PO 30 MINS PRIOR TO BREAKFAST FOREVER  93 capsule  3  . simvastatin (ZOCOR) 20 MG tablet Take 20 mg by mouth at bedtime.         No current facility-administered medications for this visit.    Allergies as of 02/23/2014  . (No Known Allergies)   Past Medical History  Diagnosis Date  . Hemorrhoids, internal Dec 2008  . DM (diabetes mellitus)   . Hiatal hernia DEC 2008 LARGE  . Aspirin long-term use    Past Surgical History  Procedure Laterality Date  . Colonoscopy  DEC 2595    COMPLICATED BY CONTAINED RECTAL PERFORATION  . Upper gastrointestinal endoscopy  DEC 2008    [id number] GASTRIC MUCOSA  . Esophagogastroduodenoscopy  05/29/2011    Procedure: ESOPHAGOGASTRODUODENOSCOPY (EGD);  Surgeon: Dorothyann Peng, MD;  [facility];  Service: Endoscopy;  Laterality: N/A;  Possible ED  .  Maloney dilation  05/29/2011    Procedure: [name] DILATION;  Surgeon: Dorothyann Peng, MD;  [facility];  Service: Endoscopy;  Laterality: N/A;  . Savory dilation  05/29/2011    Procedure: SAVORY DILATION;  Surgeon: Dorothyann Peng, MD;  [facility];  Service: Endoscopy;  Laterality: N/A;  . Esophagogastroduodenoscopy (egd) with esophageal dilation N/A 10/20/2013    [id number] at the gastroesophageal junction/ Medium sized hiatal hernia/MILD Erosive gastritis. Biopsy showed chronic inflammation, no H. pylori, fragment of fundic gland polyp    ROS:  General: Negative for anorexia, weight loss, fever, chills, fatigue, weakness. ENT: Negative for hoarseness, difficulty swallowing , nasal congestion. CV: Negative for chest pain, angina, palpitations, dyspnea on exertion, peripheral edema.  Respiratory: Negative for dyspnea at rest, dyspnea on exertion, cough, sputum, wheezing.  GI: See history of present illness. GU:  Negative for dysuria, hematuria, urinary incontinence, urinary frequency, nocturnal urination.  Endo: Negative for unusual weight change.    Physical Examination:   BP 95/53  Pulse 53  Temp(Src) 98.2 F (36.8 C) (Oral)  Ht 6' (1.829 m)  Wt 174 lb (78.926 kg)  BMI 23.59 kg/m2  General: Well-nourished, well-developed in no acute distress.  Eyes: No icterus. Mouth: Oropharyngeal mucosa moist and pink , no lesions erythema or exudate. Lungs: Clear to auscultation bilaterally.  Heart: Regular rate and rhythm, no murmurs rubs or gallops.  Abdomen: Bowel sounds are normal, nontender, nondistended, no hepatosplenomegaly or masses, no abdominal bruits or hernia , no rebound or guarding.   Extremities: No lower extremity edema. No clubbing or deformities. Neuro: Alert and oriented x 4   Skin: Warm and dry, no jaundice.   Psych: Alert and cooperative, normal mood and affect.

## 2014-02-23 NOTE — Patient Instructions (Signed)
1. Continue omeprazole daily before breakfast for your stomach. 2. Office visit in one year with Dr. Oneida Alar.

## 2014-02-24 NOTE — Progress Notes (Signed)
cc'd to pcp 

## 2014-03-23 DIAGNOSIS — E119 Type 2 diabetes mellitus without complications: Secondary | ICD-10-CM | POA: Diagnosis not present

## 2014-03-23 DIAGNOSIS — I10 Essential (primary) hypertension: Secondary | ICD-10-CM | POA: Diagnosis not present

## 2014-03-23 DIAGNOSIS — M1990 Unspecified osteoarthritis, unspecified site: Secondary | ICD-10-CM | POA: Diagnosis not present

## 2014-06-22 DIAGNOSIS — M549 Dorsalgia, unspecified: Secondary | ICD-10-CM | POA: Diagnosis not present

## 2014-06-22 DIAGNOSIS — E7800 Pure hypercholesterolemia, unspecified: Secondary | ICD-10-CM | POA: Diagnosis not present

## 2014-06-22 DIAGNOSIS — E119 Type 2 diabetes mellitus without complications: Secondary | ICD-10-CM | POA: Diagnosis not present

## 2014-06-22 DIAGNOSIS — Z23 Encounter for immunization: Secondary | ICD-10-CM | POA: Diagnosis not present

## 2014-09-28 DIAGNOSIS — R739 Hyperglycemia, unspecified: Secondary | ICD-10-CM | POA: Diagnosis not present

## 2014-09-28 DIAGNOSIS — M549 Dorsalgia, unspecified: Secondary | ICD-10-CM | POA: Diagnosis not present

## 2014-09-28 DIAGNOSIS — E119 Type 2 diabetes mellitus without complications: Secondary | ICD-10-CM | POA: Diagnosis not present

## 2014-09-28 DIAGNOSIS — E785 Hyperlipidemia, unspecified: Secondary | ICD-10-CM | POA: Diagnosis not present

## 2014-09-28 DIAGNOSIS — I10 Essential (primary) hypertension: Secondary | ICD-10-CM | POA: Diagnosis not present

## 2014-09-28 DIAGNOSIS — Z23 Encounter for immunization: Secondary | ICD-10-CM | POA: Diagnosis not present

## 2014-09-29 DIAGNOSIS — E119 Type 2 diabetes mellitus without complications: Secondary | ICD-10-CM | POA: Diagnosis not present

## 2014-09-29 DIAGNOSIS — R739 Hyperglycemia, unspecified: Secondary | ICD-10-CM | POA: Diagnosis not present

## 2014-10-26 DIAGNOSIS — H25013 Cortical age-related cataract, bilateral: Secondary | ICD-10-CM | POA: Diagnosis not present

## 2014-10-26 DIAGNOSIS — H2513 Age-related nuclear cataract, bilateral: Secondary | ICD-10-CM | POA: Diagnosis not present

## 2014-10-26 DIAGNOSIS — E119 Type 2 diabetes mellitus without complications: Secondary | ICD-10-CM | POA: Diagnosis not present

## 2014-12-14 DIAGNOSIS — R040 Epistaxis: Secondary | ICD-10-CM | POA: Diagnosis not present

## 2014-12-14 DIAGNOSIS — D649 Anemia, unspecified: Secondary | ICD-10-CM | POA: Diagnosis not present

## 2014-12-14 DIAGNOSIS — E1165 Type 2 diabetes mellitus with hyperglycemia: Secondary | ICD-10-CM | POA: Diagnosis not present

## 2015-01-04 DIAGNOSIS — I10 Essential (primary) hypertension: Secondary | ICD-10-CM | POA: Diagnosis not present

## 2015-01-04 DIAGNOSIS — E785 Hyperlipidemia, unspecified: Secondary | ICD-10-CM | POA: Diagnosis not present

## 2015-01-04 DIAGNOSIS — E1165 Type 2 diabetes mellitus with hyperglycemia: Secondary | ICD-10-CM | POA: Diagnosis not present

## 2015-01-27 ENCOUNTER — Ambulatory Visit (INDEPENDENT_AMBULATORY_CARE_PROVIDER_SITE_OTHER): Payer: Medicare Other | Admitting: Otolaryngology

## 2015-01-27 DIAGNOSIS — R040 Epistaxis: Secondary | ICD-10-CM

## 2015-02-01 ENCOUNTER — Encounter: Payer: Self-pay | Admitting: Gastroenterology

## 2015-02-24 ENCOUNTER — Ambulatory Visit (INDEPENDENT_AMBULATORY_CARE_PROVIDER_SITE_OTHER): Payer: Medicare Other | Admitting: Otolaryngology

## 2015-02-24 DIAGNOSIS — R040 Epistaxis: Secondary | ICD-10-CM | POA: Diagnosis not present

## 2015-04-05 DIAGNOSIS — I10 Essential (primary) hypertension: Secondary | ICD-10-CM | POA: Diagnosis not present

## 2015-04-05 DIAGNOSIS — E785 Hyperlipidemia, unspecified: Secondary | ICD-10-CM | POA: Diagnosis not present

## 2015-04-05 DIAGNOSIS — E1165 Type 2 diabetes mellitus with hyperglycemia: Secondary | ICD-10-CM | POA: Diagnosis not present

## 2015-07-05 DIAGNOSIS — I10 Essential (primary) hypertension: Secondary | ICD-10-CM | POA: Diagnosis not present

## 2015-07-05 DIAGNOSIS — M79603 Pain in arm, unspecified: Secondary | ICD-10-CM | POA: Diagnosis not present

## 2015-07-05 DIAGNOSIS — E1165 Type 2 diabetes mellitus with hyperglycemia: Secondary | ICD-10-CM | POA: Diagnosis not present

## 2015-07-05 DIAGNOSIS — M5414 Radiculopathy, thoracic region: Secondary | ICD-10-CM | POA: Diagnosis not present

## 2015-07-05 DIAGNOSIS — E785 Hyperlipidemia, unspecified: Secondary | ICD-10-CM | POA: Diagnosis not present

## 2015-07-27 DIAGNOSIS — E1165 Type 2 diabetes mellitus with hyperglycemia: Secondary | ICD-10-CM | POA: Diagnosis not present

## 2015-09-27 DIAGNOSIS — E1165 Type 2 diabetes mellitus with hyperglycemia: Secondary | ICD-10-CM | POA: Diagnosis not present

## 2015-09-27 DIAGNOSIS — Z23 Encounter for immunization: Secondary | ICD-10-CM | POA: Diagnosis not present

## 2015-09-27 DIAGNOSIS — I10 Essential (primary) hypertension: Secondary | ICD-10-CM | POA: Diagnosis not present

## 2015-09-27 DIAGNOSIS — M5414 Radiculopathy, thoracic region: Secondary | ICD-10-CM | POA: Diagnosis not present

## 2015-12-27 ENCOUNTER — Encounter: Payer: Self-pay | Admitting: Gastroenterology

## 2015-12-27 DIAGNOSIS — E1165 Type 2 diabetes mellitus with hyperglycemia: Secondary | ICD-10-CM | POA: Diagnosis not present

## 2015-12-27 DIAGNOSIS — R1310 Dysphagia, unspecified: Secondary | ICD-10-CM | POA: Diagnosis not present

## 2015-12-27 DIAGNOSIS — E785 Hyperlipidemia, unspecified: Secondary | ICD-10-CM | POA: Diagnosis not present

## 2015-12-27 DIAGNOSIS — K222 Esophageal obstruction: Secondary | ICD-10-CM | POA: Diagnosis not present

## 2016-01-12 ENCOUNTER — Encounter: Payer: Self-pay | Admitting: Gastroenterology

## 2016-01-12 ENCOUNTER — Other Ambulatory Visit: Payer: Self-pay

## 2016-01-12 ENCOUNTER — Ambulatory Visit (INDEPENDENT_AMBULATORY_CARE_PROVIDER_SITE_OTHER): Payer: Medicare Other | Admitting: Gastroenterology

## 2016-01-12 VITALS — BP 118/60 | HR 64 | Temp 97.4°F | Ht 72.0 in | Wt 161.8 lb

## 2016-01-12 DIAGNOSIS — R1310 Dysphagia, unspecified: Secondary | ICD-10-CM | POA: Diagnosis not present

## 2016-01-12 DIAGNOSIS — R634 Abnormal weight loss: Secondary | ICD-10-CM

## 2016-01-12 NOTE — Progress Notes (Signed)
Primary Care Physician:  Rosita Fire, MD  Primary Gastroenterologist:  Barney Drain, MD   Chief Complaint  Patient presents with  . Dysphagia    HPI:  Jose Chase is a 82 y.o. male here  For follow-up of dysphagia. Last seen in 02/2014. Doing well at that time without any swallowing issues. EGD/ED in 10/2013 for stricture at EGJ, medium-sized hh, mild erosive gastritis without H.pylori.   Complains of recurrent dysphagia to solids/liquids/large pills. Sometimes food and liquid come back up. Feels like esophagaus feels up. Can tell with the food finally passes. No heartburn. Takes omeprazole 20 mg daily. Denies abdominal pain. Has a small amount of stool every day. Has to keep his stools soft pass easily. No melena rectal bleeding. He is concerned about weight loss. Documented 15 pound since we last saw him. Worries that is cancerous come back. History of prostate cancer status post surgery. Is not followed by urologist. Also complains of daily sensation of something crawling on his upper back and then associated with pain. Lasts for a few minutes at a time.   Notes that he did cut back on his bread intake because of his diabetes. Wonders if this is contributing to his weight loss.  Current Outpatient Prescriptions  Medication Sig Dispense Refill  . acetaminophen (TYLENOL) 325 MG tablet Take 650 mg by mouth. Takes one occasionally     . glipiZIDE (GLUCOTROL) 5 MG 24 hr tablet Take 10 mg by mouth daily.     . metFORMIN (GLUCOPHAGE) 500 MG tablet Take 500 mg by mouth 2 (two) times daily with a meal. Takes 2 tablets bid    . Multiple Vitamins-Minerals (MEGA MULTI MEN PO) Take by mouth. Takes one tablet daily    . omeprazole (PRILOSEC) 20 MG capsule 1 PO 30 MINS PRIOR TO BREAKFAST FOREVER 93 capsule 3  . simvastatin (ZOCOR) 20 MG tablet Take 20 mg by mouth at bedtime.       No current facility-administered medications for this visit.    Allergies as of 01/12/2016  . (No Known Allergies)     Past Medical History  Diagnosis Date  . Hemorrhoids, internal Dec 2008  . DM (diabetes mellitus) ([place])   . Hiatal hernia DEC 2008 LARGE  . Aspirin long-term use   . Prostate cancer [facility])     Past Surgical History  Procedure Laterality Date  . Colonoscopy  DEC [id number]    COMPLICATED BY CONTAINED RECTAL PERFORATION  . Upper gastrointestinal endoscopy  DEC 2008    [id number] GASTRIC MUCOSA  . Esophagogastroduodenoscopy  05/29/2011    Procedure: ESOPHAGOGASTRODUODENOSCOPY (EGD);  Surgeon: Dorothyann Peng, MD;  [facility];  Service: Endoscopy;  Laterality: N/A;  Possible ED  . Maloney dilation  05/29/2011    Procedure: [name] DILATION;  Surgeon: Dorothyann Peng, MD;  [facility];  Service: Endoscopy;  Laterality: N/A;  . Savory dilation  05/29/2011    Procedure: SAVORY DILATION;  Surgeon: Dorothyann Peng, MD;  [facility];  Service: Endoscopy;  Laterality: N/A;  . Esophagogastroduodenoscopy (egd) with esophageal dilation N/A 10/20/2013    [id number] at the gastroesophageal junction/ Medium sized hiatal hernia/MILD Erosive gastritis. Biopsy showed chronic inflammation, no H. pylori, fragment of fundic gland polyp  . Prostate surgery      prostate cancer  . Back surgery      Family History  Problem Relation Age of Onset  . Colon cancer Neg Hx   . Colon polyps Neg Hx  Social History   Social History  . Marital Status: Married    Spouse Name: N/A  . Number of Children: N/A  . Years of Education: N/A   Occupational History  . Not on file.   Social History Main Topics  . Smoking status: Former Smoker    Types: Cigarettes    Quit date: 01/11/1973  . Smokeless tobacco: Never Used     Comment: Never smoke very much  . Alcohol Use: Not on file  . Drug Use: Not on file  . Sexual Activity: Not on file   Other Topics Concern  . Not on file   Social History Narrative      ROS:  General: Negative for anorexia, weakness, fever, chills.  See history of present illness Eyes: Negative for vision changes.  ENT: Negative for hoarseness, nasal congestion. CV: Negative for chest pain, angina, palpitations, dyspnea on exertion, peripheral edema.  Respiratory: Negative for dyspnea at rest, dyspnea on exertion, cough, sputum, wheezing.  GI: See history of present illness. GU:  Negative for dysuria, hematuria, urinary incontinence, urinary frequency, nocturnal urination.  MS: Negative for joint pain, low back pain. See history of present illness Derm: Negative for rash or itching.  Neuro: Negative for weakness, abnormal sensation, seizure, frequent headaches, memory loss, confusion.  Psych: Negative for anxiety, depression, suicidal ideation, hallucinations.  Endo: See history of present illness Heme: Negative for bruising or bleeding. Allergy: Negative for rash or hives.    Physical Examination:  BP 118/60 mmHg  Pulse 64  Temp(Src) 97.4 F (36.3 C)  Ht 6' (1.829 m)  Wt 161 lb 12.8 oz (73.392 kg)  BMI 21.94 kg/m2   General: Well-nourished, well-developed in no acute distress.  Head: Normocephalic, atraumatic.   Eyes: Conjunctiva pink, no icterus. Mouth: Oropharyngeal mucosa moist and pink , no lesions erythema or exudate. Neck: Supple without thyromegaly, masses, or lymphadenopathy.  Lungs: Clear to auscultation bilaterally.  Heart: Regular rate and rhythm, no murmurs rubs or gallops.  Abdomen: Bowel sounds are normal, nontender, nondistended, no hepatosplenomegaly or masses, no abdominal bruits or    hernia , no rebound or guarding.   Back: There was evidence of rash. No palpable tenderness Rectal: Not performed Extremities: No lower extremity edema. No clubbing or deformities.  Neuro: Alert and oriented x 4 , grossly normal neurologically.  Skin: Warm and dry, no rash or jaundice.   Psych: Alert and cooperative, normal mood and affect.  Labs: Requested from PCP.  Imaging Studies: No results  found.   Impression/plan: 82 year old gentleman with recurrent esophageal dysphagia, prior stricture requiring dilation in 2014. Having difficulty swallowing large pills, solid food and sometimes liquids. Suspect recurrent stricture. Offered upper endoscopy with dilation in the near future. I have discussed the risks, alternatives, benefits with regards to but not limited to the risk of reaction to medication, bleeding, infection, perforation and the patient is agreeable to proceed. Written consent to be obtained.  He also is concerned about 15 pound unexplained weight loss as outlined. He feels like he is eating adequately. Dietary change, decreased bread consumption. Await EGD findings. Obtain labs from PCP for review. Cannot rule out need for CT of abdomen pelvis for unexplained weight loss in the near future. Further recommendations to follow.

## 2016-01-12 NOTE — Patient Instructions (Signed)
1. Upper endoscopy as scheduled. See separate instructions.  2. I will review labs from Dr. Legrand Rams and we will let you know if any further work up is needed for your weight loss.

## 2016-01-12 NOTE — Progress Notes (Signed)
CC'ED TO PCP 

## 2016-01-31 ENCOUNTER — Telehealth: Payer: Self-pay

## 2016-01-31 NOTE — Progress Notes (Signed)
REVIEWED-NO ADDITIONAL RECOMMENDATIONS. 

## 2016-01-31 NOTE — Telephone Encounter (Signed)
Melanie from Endo called and states that pt did not show up for his procedure today. Called pt and was unable to reach

## 2016-01-31 NOTE — Telephone Encounter (Signed)
Spoke with wife. States they forgot about the procedure today. Pt has been reschedule to 03/28. Mailed new instructions and review them with wife on the phone

## 2016-02-07 ENCOUNTER — Encounter (HOSPITAL_COMMUNITY)
Admission: RE | Disposition: A | Discharge status: Home / Self Care | Payer: Self-pay | Source: Ambulatory Visit | Attending: Gastroenterology

## 2016-02-07 ENCOUNTER — Encounter (HOSPITAL_COMMUNITY): Payer: Self-pay | Admitting: *Deleted

## 2016-02-07 ENCOUNTER — Ambulatory Visit (HOSPITAL_COMMUNITY)
Admission: RE | Admit: 2016-02-07 | Discharge: 2016-02-07 | Disposition: A | Discharge status: Home / Self Care | Payer: Medicare Other | Source: Ambulatory Visit | Attending: Gastroenterology | Admitting: Gastroenterology

## 2016-02-07 DIAGNOSIS — E119 Type 2 diabetes mellitus without complications: Secondary | ICD-10-CM | POA: Insufficient documentation

## 2016-02-07 DIAGNOSIS — Z79899 Other long term (current) drug therapy: Secondary | ICD-10-CM | POA: Insufficient documentation

## 2016-02-07 DIAGNOSIS — Z7982 Long term (current) use of aspirin: Secondary | ICD-10-CM | POA: Insufficient documentation

## 2016-02-07 DIAGNOSIS — Z87891 Personal history of nicotine dependence: Secondary | ICD-10-CM | POA: Diagnosis not present

## 2016-02-07 DIAGNOSIS — K222 Esophageal obstruction: Secondary | ICD-10-CM | POA: Diagnosis not present

## 2016-02-07 DIAGNOSIS — Z8546 Personal history of malignant neoplasm of prostate: Secondary | ICD-10-CM | POA: Diagnosis not present

## 2016-02-07 DIAGNOSIS — K2970 Gastritis, unspecified, without bleeding: Secondary | ICD-10-CM

## 2016-02-07 DIAGNOSIS — K449 Diaphragmatic hernia without obstruction or gangrene: Secondary | ICD-10-CM | POA: Insufficient documentation

## 2016-02-07 DIAGNOSIS — Z7984 Long term (current) use of oral hypoglycemic drugs: Secondary | ICD-10-CM | POA: Insufficient documentation

## 2016-02-07 DIAGNOSIS — K319 Disease of stomach and duodenum, unspecified: Secondary | ICD-10-CM | POA: Insufficient documentation

## 2016-02-07 DIAGNOSIS — R1310 Dysphagia, unspecified: Secondary | ICD-10-CM | POA: Diagnosis not present

## 2016-02-07 DIAGNOSIS — K2950 Unspecified chronic gastritis without bleeding: Secondary | ICD-10-CM | POA: Diagnosis not present

## 2016-02-07 HISTORY — PX: SAVORY DILATION: SHX5439

## 2016-02-07 HISTORY — PX: ESOPHAGOGASTRODUODENOSCOPY: SHX5428

## 2016-02-07 LAB — GLUCOSE, CAPILLARY: GLUCOSE-CAPILLARY: 111 mg/dL — AB (ref 65–99)

## 2016-02-07 SURGERY — EGD (ESOPHAGOGASTRODUODENOSCOPY)
Anesthesia: Moderate Sedation

## 2016-02-07 MED ORDER — MEPERIDINE HCL 100 MG/ML IJ SOLN
INTRAMUSCULAR | Status: AC
  Filled 2016-02-07: qty 2

## 2016-02-07 MED ORDER — SODIUM CHLORIDE 0.9 % IV SOLN
INTRAVENOUS | Status: DC
  Administered 2016-02-07: 1000 mL via INTRAVENOUS

## 2016-02-07 MED ORDER — LIDOCAINE VISCOUS 2 % MT SOLN
OROMUCOSAL | Status: DC | PRN
  Administered 2016-02-07: 3 mL via OROMUCOSAL

## 2016-02-07 MED ORDER — STERILE WATER FOR IRRIGATION IR SOLN
Status: DC | PRN
  Administered 2016-02-07: 11:00:00

## 2016-02-07 MED ORDER — MINERAL OIL PO OIL
TOPICAL_OIL | ORAL | Status: AC
  Filled 2016-02-07: qty 30

## 2016-02-07 MED ORDER — MEPERIDINE HCL 100 MG/ML IJ SOLN
INTRAMUSCULAR | Status: DC | PRN
  Administered 2016-02-07 (×2): 25 mg via INTRAVENOUS

## 2016-02-07 MED ORDER — LIDOCAINE VISCOUS 2 % MT SOLN
OROMUCOSAL | Status: AC
  Filled 2016-02-07: qty 15

## 2016-02-07 MED ORDER — OMEPRAZOLE 20 MG PO CPDR: DELAYED_RELEASE_CAPSULE | ORAL | Status: DC

## 2016-02-07 MED ORDER — MIDAZOLAM HCL 5 MG/5ML IJ SOLN
INTRAMUSCULAR | Status: DC | PRN
  Administered 2016-02-07 (×2): 2 mg via INTRAVENOUS

## 2016-02-07 MED ORDER — MIDAZOLAM HCL 5 MG/5ML IJ SOLN
INTRAMUSCULAR | Status: AC
  Filled 2016-02-07: qty 10

## 2016-02-07 NOTE — H&P (Signed)
Primary Care Physician:  Rosita Fire, MD Primary Gastroenterologist:  Dr. Oneida Alar  Pre-Procedure History & Physical: HPI:  Jose Chase is a 82 y.o. male here for [place].  Past Medical History  Diagnosis Date  . Hemorrhoids, internal Dec 2008  . DM (diabetes mellitus) ([place])   . Hiatal hernia DEC 2008 LARGE  . Aspirin long-term use   . Prostate cancer [facility])     Past Surgical History  Procedure Laterality Date  . Colonoscopy  DEC [id number]    COMPLICATED BY CONTAINED RECTAL PERFORATION  . Upper gastrointestinal endoscopy  DEC 2008    [id number] GASTRIC MUCOSA  . Esophagogastroduodenoscopy  05/29/2011    Procedure: ESOPHAGOGASTRODUODENOSCOPY (EGD);  Surgeon: Dorothyann Peng, MD;  [facility];  Service: Endoscopy;  Laterality: N/A;  Possible ED  . Maloney dilation  05/29/2011    Procedure: [name] DILATION;  Surgeon: Dorothyann Peng, MD;  [facility];  Service: Endoscopy;  Laterality: N/A;  . Savory dilation  05/29/2011    Procedure: SAVORY DILATION;  Surgeon: Dorothyann Peng, MD;  [facility];  Service: Endoscopy;  Laterality: N/A;  . Esophagogastroduodenoscopy (egd) with esophageal dilation N/A 10/20/2013    [id number] at the gastroesophageal junction/ Medium sized hiatal hernia/MILD Erosive gastritis. Biopsy showed chronic inflammation, no H. pylori, fragment of fundic gland polyp  . Prostate surgery      prostate cancer  . Back surgery      Prior to Admission medications   Medication Sig Start Date End Date Taking? Authorizing Provider  aspirin EC 81 MG tablet Take 81 mg by mouth daily.   Yes Historical Provider, MD  glipiZIDE (GLUCOTROL) 10 MG tablet Take 10 mg by mouth daily before breakfast.   Yes Historical Provider, MD  metFORMIN (GLUCOPHAGE) 500 MG tablet Take 1,000 mg by mouth 2 (two) times daily with a meal.    Yes Historical Provider, MD  Multiple Vitamins-Minerals (MEGA MULTI MEN PO) Take by mouth. Takes one tablet daily   Yes  Historical Provider, MD  omeprazole (PRILOSEC) 20 MG capsule 1 PO 30 MINS PRIOR TO BREAKFAST FOREVER 10/15/13  Yes Danie Binder, MD  simvastatin (ZOCOR) 20 MG tablet Take 20 mg by mouth at bedtime.     Yes Historical Provider, MD  tamsulosin (FLOMAX) 0.4 MG CAPS capsule Take 0.4 mg by mouth daily.   Yes Historical Provider, MD    Allergies as of 01/12/2016  . (No Known Allergies)    Family History  Problem Relation Age of Onset  . Colon cancer Neg Hx   . Colon polyps Neg Hx   . Heart disease Father   . Heart disease Brother   . Cancer Brother   . Cancer Brother   . Cancer Other   . Cancer Sister     Social History   Social History  . Marital Status: Married    Spouse Name: N/A  . Number of Children: N/A  . Years of Education: N/A   Occupational History  . Not on file.   Social History Main Topics  . Smoking status: Former Smoker    Types: Cigarettes    Quit date: 01/11/1973  . Smokeless tobacco: Never Used     Comment: Never smoked very much. Occasional smoker-once or twice a week. Quit in 1974.  Marland Kitchen Alcohol Use: No  . Drug Use: No  . Sexual Activity: Not on file   Other Topics Concern  . Not on file   Social History Narrative  Review of Systems: See HPI, otherwise negative ROS   Physical Exam: BP 155/84 mmHg  Pulse 57  Temp(Src) 97.7 F (36.5 C) (Oral)  Resp 14  Ht 6' (1.829 m)  Wt 161 lb (73.029 kg)  BMI 21.83 kg/m2  SpO2 100% General:   Alert,  pleasant and cooperative in NAD Head:  Normocephalic and atraumatic. Neck:  Supple; Lungs:  Clear throughout to auscultation.    Heart:  Regular rate and rhythm. Abdomen:  Soft, nontender and nondistended. Normal bowel sounds, without guarding, and without rebound.   Neurologic:  Alert and  oriented x4;  grossly normal neurologically.  Impression/Plan:     DYSPHAGIA  PLAN:  EGD/DIL TODAY

## 2016-02-07 NOTE — Op Note (Signed)
[facility] [place] Patient Name: Jose Chase Procedure Date: 02/07/2016 10:43 AM MRN: [id number] Date of Birth: 03-Dec-1931 Attending MD: Barney Drain , MD CSN: [id number] Age: 82 Admit Type: Outpatient Procedure:                Upper GI endoscopy Indications:              Dysphagia Providers:                Barney Drain, MD, Gwenlyn Fudge, RN, Georgeann Oppenheim,                            Technician Referring MD:             Rosita Fire (Referring MD) Medicines:                Meperidine 50 mg IV, Midazolam 4 mg IV Complications:            No immediate complications. Estimated Blood Loss:     Estimated blood loss was minimal. Procedure:                Pre-Anesthesia Assessment:                           - Prior to the procedure, a History and Physical                            was performed, and patient medications and                            allergies were reviewed. The patient's tolerance of                            previous anesthesia was also reviewed. The risks                            and benefits of the procedure and the sedation                            options and risks were discussed with the patient.                            All questions were answered, and informed consent                            was obtained. Prior Anticoagulants: The patient has                            taken aspirin, last dose was day of procedure. ASA                            Grade Assessment: II - A patient with mild systemic                            disease. After reviewing the risks and benefits,  the patient was deemed in satisfactory condition to                            undergo the procedure.                           After obtaining informed consent, the endoscope was                            passed under direct vision. Throughout the                            procedure, the patient's blood pressure, pulse, and                            oxygen  saturations were monitored continuously. The                            [id number]) scope was introduced through the                            mouth, and advanced to the second part of duodenum.                            The upper GI endoscopy was accomplished without                            difficulty. Scope In: 11:02:23 AM Scope Out: 11:12:11 AM Total Procedure Duration: 0 hours 9 minutes 48 seconds  Findings:      [Number] moderate (circumferential scarring or stenosis; an endoscope       may pass) [Appearance] stenoses were found. A guidewire was placed and       the scope was withdrawn. Dilation was performed with a Savary dilator       with mild resistance at 12.8 mm and moderate resistance at 14 mm, 15 mm       and 16 mm.      A medium-sized hiatal hernia was present.      Scattered moderate inflammation characterized by congestion (edema),       erosions and erythema was found in the gastric body and in the gastric       antrum. Biopsies were taken with a cold forceps for Helicobacter pylori       testing. Estimated blood loss was minimal.      The duodenal bulb and second portion of the duodenum were normal. Impression:               - Esophageal stenoses. Dilated.                           - Medium-sized hiatal hernia.                           - Gastritis. Biopsied.                           - Normal duodenal bulb and second portion of the  duodenum. Moderate Sedation:      Moderate (conscious) sedation was administered by the endoscopy nurse       and supervised by the endoscopist. The following parameters were       monitored: oxygen saturation, heart rate, blood pressure, and response       to care. Recommendation:           - Patient has a contact number available for                            emergencies. The signs and symptoms of potential                            delayed complications were discussed with the                             patient. Return to normal activities tomorrow.                            Written discharge instructions were provided to the                            patient.                           - Low fat diet.                           - Use Prilosec (omeprazole) 20 mg PO BID.                           - Await pathology results.                           CALL IN ONE MONTH IF STILL HAVING dysphagia. IF SO,                            WILL NEED REPEAT EGD/DIL.                           - Return to my office in 4 months.                           - Follow an antireflux regimen indefinitely.                           - Continue present medications.                           - No ibuprofen, OR naproxen, MAY CONTINUE ASA. Procedure Code(s):        --- Professional ---                           [phone], Esophagogastroduodenoscopy, flexible,                            transoral; with insertion of  guide wire followed by                            passage of dilator(s) through esophagus over guide                            wire                           [id number], Esophagogastroduodenoscopy, flexible,                            transoral; with biopsy, single or multiple Diagnosis Code(s):        --- Professional ---                           K22.2, Esophageal obstruction                           K44.9, Diaphragmatic hernia without obstruction or                            gangrene                           K29.70, Gastritis, unspecified, without bleeding                           R13.10, Dysphagia, unspecified CPT copyright 2016 [employer]. All rights reserved. The codes documented in this report are preliminary and upon coder review may  be revised to meet current compliance requirements. Barney Drain, MD Barney Drain, MD 02/07/2016 1:12:52 PM This report has been signed electronically. Number of Addenda: 0

## 2016-02-07 NOTE — Discharge Instructions (Signed)
I dilated your esophagus. You HAD A TIGHT stricture near the base of your esophagus.  You have gastritis & A GASTRIC POLYP. I biopsied your stomach.   AVOID REFLUX TRIGGERS. SEE INFO BELOW.  FOLLOW A LOW FAT DIET. MEATS SHOULD BE BAKED, BROILED, OR BOILED. AVOID FRIED FOODS.   INCREASE OMEPRAZOLE.  TAKE 30 MINUTES PRIOR TO YOUR MEALS TWICE DAILY.  YOUR BIOPSY RESULTS WILL BE AVAILABLE IN MY CHART MAR 31 AND MY OFFICE WILL CONTACT YOU IN 10-14 DAYS WITH YOUR RESULTS.   PLEASE CALL IN ONE MONTH IF YOU ARE STILL HAVING PROBLEMS SWALLOWING. IF SO YOU WILL NEED TO BE STRETCHED AGAIN.  FOLLOW UP IN 3 MOS.  UPPER ENDOSCOPY AFTER CARE Read the instructions outlined below and refer to this sheet in the next week. These discharge instructions provide you with general information on caring for yourself after you leave the hospital. While your treatment has been planned according to the most current medical practices available, unavoidable complications occasionally occur. If you have any problems or questions after discharge, call DR. Caterina Racine, [phone].  ACTIVITY  You may resume your regular activity, but move at a slower pace for the next 24 hours.   Take frequent rest periods for the next 24 hours.   Walking will help get rid of the air and reduce the bloated feeling in your belly (abdomen).   No driving for 24 hours (because of the medicine (anesthesia) used during the test).   You may shower.   Do not sign any important legal documents or operate any machinery for 24 hours (because of the anesthesia used during the test).    NUTRITION  Drink plenty of fluids.   You may resume your normal diet as instructed by your doctor.   Begin with a light meal and progress to your normal diet. Heavy or fried foods are harder to digest and may make you feel sick to your stomach (nauseated).   Avoid alcoholic beverages for 24 hours or as instructed.    MEDICATIONS  You may resume your  normal medications.   WHAT YOU CAN EXPECT TODAY  Some feelings of bloating in the abdomen.   Passage of more gas than usual.    IF YOU HAD A BIOPSY TAKEN DURING THE UPPER ENDOSCOPY:  Eat a soft diet IF YOU HAVE NAUSEA, BLOATING, ABDOMINAL PAIN, OR VOMITING.    FINDING OUT THE RESULTS OF YOUR TEST Not all test results are available during your visit. DR. Oneida Alar WILL CALL YOU WITHIN 14 DAYS OF YOUR PROCEDUE WITH YOUR RESULTS. Do not assume everything is normal if you have not heard from DR. Jacion Dismore, CALL HER OFFICE AT [phone].  SEEK IMMEDIATE MEDICAL ATTENTION AND CALL THE OFFICE: [phone] IF:  You have more than a spotting of blood in your stool.   Your belly is swollen (abdominal distention).   You are nauseated or vomiting.   You have a temperature over 101F.   You have abdominal pain or discomfort that is severe or gets worse throughout the day.  Gastritis  Gastritis is an inflammation (the body's way of reacting to injury and/or infection) of the stomach. It is often caused by viral or bacterial (germ) infections. It can also be caused BY ASPIRIN, BC/GOODY POWDER'S, (IBUPROFEN) MOTRIN, OR ALEVE (NAPROXEN), chemicals (including alcohol), SPICY FOODS, and medications. This illness may be associated with generalized malaise (feeling tired, not well), UPPER ABDOMINAL STOMACH cramps, and fever. One common bacterial cause of gastritis is an organism known as  H. Pylori. This can be treated with antibiotics.   ESOPHAGEAL STRICTURE  Esophageal strictures can be caused by stomach acid backing up into the tube that carries food from the mouth down to the stomach (lower esophagus).  TREATMENT There are a number of medicines used to treat reflux/stricture, including: Antacids.  Proton-pump inhibitors: OMEPRAZOLE  HOME CARE INSTRUCTIONS Eat 2-3 hours before going to bed.  Try to reach and maintain a healthy weight.  Do not eat just a few very large meals. Instead, eat 4 TO 6  smaller meals throughout the day.  Try to identify foods and beverages that make your symptoms worse, and avoid these.  Avoid tight clothing.  Do not exercise right after eating.   Lifestyle and home remedies TO HELP CONTROL HEARTBURN AND REFLUX.  You may eliminate or reduce the frequency of heartburn by making the following lifestyle changes:   Control your weight. Being overweight is a major risk factor for heartburn and GERD. Excess pounds put pressure on your abdomen, pushing up your stomach and causing acid to back up into your esophagus.    Eat smaller meals. 4 TO 6 MEALS A DAY. This reduces pressure on the lower esophageal sphincter, helping to prevent the valve from opening and acid from washing back into your esophagus.    Loosen your belt. Clothes that fit tightly around your waist put pressure on your abdomen and the lower esophageal sphincter.    Eliminate heartburn triggers. Everyone has specific triggers. Common triggers such as fatty or fried foods, spicy food, tomato sauce, carbonated beverages, alcohol, chocolate, mint, garlic, onion, caffeine and nicotine may make heartburn worse.    Avoid stooping or bending. Tying your shoes is OK. Bending over for longer periods to weed your garden isn't, especially soon after eating.    Don't lie down after a meal. Wait at least three to four hours after eating before going to bed, and don't lie down right after eating.    PLACE THE HEAD OF YOUR BED ON 6 INCH BLOCKS.  Alternative medicine  Several home remedies exist for treating GERD, but they provide only temporary relief. They include drinking baking soda (sodium bicarbonate) added to water or drinking other fluids such as baking soda mixed with cream of tartar and water.  Although these liquids create temporary relief by neutralizing, washing away or buffering acids, eventually they aggravate the situation by adding gas and fluid to your stomach, increasing pressure and  causing more acid reflux. Further, adding more sodium to your diet may increase your blood pressure and add stress to your heart, and excessive bicarbonate ingestion can alter the acid-base balance in your body.

## 2016-02-10 ENCOUNTER — Encounter (HOSPITAL_COMMUNITY): Payer: Self-pay | Admitting: Gastroenterology

## 2016-02-26 ENCOUNTER — Telehealth: Payer: Self-pay | Admitting: Gastroenterology

## 2016-02-26 NOTE — Telephone Encounter (Signed)
Please call pt. His stomach Bx shows gastritis.    AVOID REFLUX TRIGGERS.   FOLLOW A LOW FAT DIET. MEATS SHOULD BE BAKED, BROILED, OR BOILED. AVOID FRIED FOODS.   INCREASE OMEPRAZOLE.  TAKE 30 MINUTES PRIOR TO YOUR MEALS TWICE DAILY.  PLEASE LET DR. Toleen Lachapelle KNOW IF YOU ARE STILL HAVING PROBLEMS SWALLOWING. IF SO YOU WILL NEED TO BE STRETCHED AGAIN.  FOLLOW UP IN 3 MOS E30 DYSPHAGIA/GERD.

## 2016-02-27 NOTE — Telephone Encounter (Signed)
APPT MADE

## 2016-02-28 NOTE — Telephone Encounter (Signed)
Pt is aware of results. 

## 2016-03-21 NOTE — Progress Notes (Signed)
Labs reviewed from PCP dated 07/05/2015 Glucose 137, BUN 17, creatinine 1.07, hemoglobin A1c 7.9, total bilirubin 0.1, alkaline phosphatase 70, AST 21, ALT 12, albumin 3.8, white blood cell count 6400, hemoglobin 10, hematocrit 34.8, MCV 63.6, platelets 313,000.  Patient needs updated labs now. Has upcoming appointment next month. Needs CBC, iron/tibc, ferritin, ifobt.

## 2016-03-22 ENCOUNTER — Other Ambulatory Visit: Payer: Self-pay

## 2016-03-22 DIAGNOSIS — D649 Anemia, unspecified: Secondary | ICD-10-CM

## 2016-03-22 NOTE — Progress Notes (Signed)
PT's wife is aware the stool test at front for pick up and to return here. Lab orders in for [facility] and he will go there for blood work.

## 2016-03-27 DIAGNOSIS — E119 Type 2 diabetes mellitus without complications: Secondary | ICD-10-CM | POA: Diagnosis not present

## 2016-03-27 DIAGNOSIS — E1165 Type 2 diabetes mellitus with hyperglycemia: Secondary | ICD-10-CM | POA: Diagnosis not present

## 2016-03-27 DIAGNOSIS — E785 Hyperlipidemia, unspecified: Secondary | ICD-10-CM | POA: Diagnosis not present

## 2016-03-27 DIAGNOSIS — R739 Hyperglycemia, unspecified: Secondary | ICD-10-CM | POA: Diagnosis not present

## 2016-03-27 DIAGNOSIS — M79603 Pain in arm, unspecified: Secondary | ICD-10-CM | POA: Diagnosis not present

## 2016-03-27 DIAGNOSIS — M5414 Radiculopathy, thoracic region: Secondary | ICD-10-CM | POA: Diagnosis not present

## 2016-03-27 DIAGNOSIS — I10 Essential (primary) hypertension: Secondary | ICD-10-CM | POA: Diagnosis not present

## 2016-03-27 DIAGNOSIS — R1310 Dysphagia, unspecified: Secondary | ICD-10-CM | POA: Diagnosis not present

## 2016-04-10 DIAGNOSIS — E785 Hyperlipidemia, unspecified: Secondary | ICD-10-CM | POA: Diagnosis not present

## 2016-04-10 DIAGNOSIS — D649 Anemia, unspecified: Secondary | ICD-10-CM | POA: Diagnosis not present

## 2016-04-10 DIAGNOSIS — E1165 Type 2 diabetes mellitus with hyperglycemia: Secondary | ICD-10-CM | POA: Diagnosis not present

## 2016-04-10 DIAGNOSIS — R1310 Dysphagia, unspecified: Secondary | ICD-10-CM | POA: Diagnosis not present

## 2016-04-18 DIAGNOSIS — Z1211 Encounter for screening for malignant neoplasm of colon: Secondary | ICD-10-CM | POA: Diagnosis not present

## 2016-05-02 ENCOUNTER — Ambulatory Visit (INDEPENDENT_AMBULATORY_CARE_PROVIDER_SITE_OTHER): Payer: Medicare Other | Admitting: Gastroenterology

## 2016-05-02 ENCOUNTER — Encounter: Payer: Self-pay | Admitting: Gastroenterology

## 2016-05-02 VITALS — BP 114/60 | HR 54 | Temp 97.0°F | Ht 73.0 in | Wt 161.0 lb

## 2016-05-02 DIAGNOSIS — K2990 Gastroduodenitis, unspecified, without bleeding: Secondary | ICD-10-CM

## 2016-05-02 DIAGNOSIS — K2970 Gastritis, unspecified, without bleeding: Secondary | ICD-10-CM

## 2016-05-02 DIAGNOSIS — R1310 Dysphagia, unspecified: Secondary | ICD-10-CM | POA: Diagnosis not present

## 2016-05-02 MED ORDER — OMEPRAZOLE 20 MG PO CPDR: DELAYED_RELEASE_CAPSULE | ORAL | Status: DC

## 2016-05-02 NOTE — Assessment & Plan Note (Signed)
Somewhat difficult historian. At times he states he swallows without any difficulty. At other times he feels like foam is building up and he has difficulty swallowing or food goes down slowly. I suspect he has persistent esophageal stricture however patient is reluctant to pursuing upper endoscopy at this time. He did not increase his PPI to twice a day according to his report today. We will go ahead and have him do that. New prescription sent. He will call with a progress report a couple weeks. If persistent symptoms, consider EGD versus barium pill esophagram.  No further weight loss noted.  History of anemia in August of last year, request most recent labs and Hemoccults from PCP.

## 2016-05-02 NOTE — Patient Instructions (Signed)
1. Increase omeprazole to 20 mg before breakfast and 20 mg before your evening meal. New prescription sent to your pharmacy. 2. Call in two weeks and let me know if your swallowing is any better. If not, I would recommend xray versus repeat upper endoscopy at that time.  3. I will request copy of your labs from Dr. Legrand Rams for review.

## 2016-05-02 NOTE — Progress Notes (Signed)
cc'ed to pcp °

## 2016-05-02 NOTE — Progress Notes (Signed)
Primary Care Physician: Rosita Fire, MD  Primary Gastroenterologist: Barney Drain, MD   Chief Complaint  Patient presents with  . Follow-up  . Hoarse    HPI: Jose Chase is a 82 y.o. male here For follow-up. He underwent an EGD back in March of this year for further evaluation of dysphagia. He had moderate stenosis of the esophagus found. Dilated with savory dilator up to 16 mm. Medium sized hiatal hernia. Gastritis but no H. pylori. Patient was supposed to increase his omeprazole to twice daily but it is not clear that he has done this. He tells me some days he swallows without any problems. Other days he feels like the food doesn't want to go down well. He usually knows if there is been a be an issue the days he has more difficulty swallowing his secretions. He states he has phone that will come back up. Denies significant postnasal drip. No odynophagia. No heartburn. Denies abdominal pain, constipation, diarrhea, melena, rectal bleeding.  He tells me he took our labs and had that done at Dr. Josephine Cables office recently. He also reports negative Hemoccult and anemia resolved. We have requested records.  Weight has been stable over the past 3 months.     Current Outpatient Prescriptions  Medication Sig Dispense Refill  . aspirin EC 81 MG tablet Take 81 mg by mouth daily.    Marland Kitchen glipiZIDE (GLUCOTROL) 10 MG tablet Take 10 mg by mouth daily before breakfast.    . metFORMIN (GLUCOPHAGE) 500 MG tablet Take 1,000 mg by mouth 2 (two) times daily with a meal.     . Multiple Vitamins-Minerals (MEGA MULTI MEN PO) Take by mouth. Takes one tablet daily    . omeprazole (PRILOSEC) 20 MG capsule 1 PO 30 MINS PRIOR TO BREAKFAST and supper 60 capsule 11  . simvastatin (ZOCOR) 20 MG tablet Take 20 mg by mouth at bedtime.      . tamsulosin (FLOMAX) 0.4 MG CAPS capsule Take 0.4 mg by mouth daily.     No current facility-administered medications for this visit.    Allergies as of 05/02/2016  .  (No Known Allergies)    ROS:  General: Negative for anorexia, weight loss, fever, chills, fatigue, weakness. ENT: Negative for hoarseness,  nasal congestion. See hpi CV: Negative for chest pain, angina, palpitations, dyspnea on exertion, peripheral edema.  Respiratory: Negative for dyspnea at rest, dyspnea on exertion, cough, sputum, wheezing.  GI: See history of present illness. GU:  Negative for dysuria, hematuria, urinary incontinence, urinary frequency, nocturnal urination.  Endo: Negative for unusual weight change.    Physical Examination:   BP 114/60 mmHg  Pulse 54  Temp(Src) 97 F (36.1 C)  Ht 6\' 1"  (1.854 m)  Wt 161 lb (73.029 kg)  BMI 21.25 kg/m2  General: Well-nourished, well-developed in no acute distress.  Eyes: No icterus. Mouth: Oropharyngeal mucosa moist and pink , no lesions erythema or exudate. Lungs: Clear to auscultation bilaterally.  Heart: Regular rate and rhythm, no murmurs rubs or gallops.  Abdomen: Bowel sounds are normal, nontender, nondistended, no hepatosplenomegaly or masses, no abdominal bruits or hernia , no rebound or guarding.   Extremities: No lower extremity edema. No clubbing or deformities. Neuro: Alert and oriented x 4   Skin: Warm and dry, no jaundice.   Psych: Alert and cooperative, normal mood and affect.  Labs:  Labs from August 2016, hemoglobin 10, hematocrit 34.8, MCV 63.6.  Imaging Studies: No results found.

## 2016-05-29 NOTE — Progress Notes (Signed)
Labs reviewed from Dr. Legrand Rams dated 03/2016 Iron 24L, TIBC 441H, fe sat 5L, ferritin 8L, H/H 9.5/33.5L, BUN 19, Cre 1.04, , MCV 63.4, platelet 328,000. Tbili 0.4, AP 56, AST 25, ALT 13, alb 3.9  Patient has IDA. He needs a colonoscopy with Dr. Oneida Alar. 1/2 dose metformin and glipizide day of prep.

## 2016-05-31 NOTE — Progress Notes (Signed)
Tried to call. Many rings and no answer. Sending a letter to call.

## 2016-06-07 ENCOUNTER — Encounter: Payer: Self-pay | Admitting: Gastroenterology

## 2016-06-12 DIAGNOSIS — M5414 Radiculopathy, thoracic region: Secondary | ICD-10-CM | POA: Diagnosis not present

## 2016-06-12 DIAGNOSIS — E1165 Type 2 diabetes mellitus with hyperglycemia: Secondary | ICD-10-CM | POA: Diagnosis not present

## 2016-06-12 DIAGNOSIS — E785 Hyperlipidemia, unspecified: Secondary | ICD-10-CM | POA: Diagnosis not present

## 2016-06-12 DIAGNOSIS — I10 Essential (primary) hypertension: Secondary | ICD-10-CM | POA: Diagnosis not present

## 2016-06-22 ENCOUNTER — Encounter: Payer: Self-pay | Admitting: Gastroenterology

## 2016-09-06 DIAGNOSIS — Z23 Encounter for immunization: Secondary | ICD-10-CM | POA: Diagnosis not present

## 2016-10-02 DIAGNOSIS — K219 Gastro-esophageal reflux disease without esophagitis: Secondary | ICD-10-CM | POA: Diagnosis not present

## 2016-10-02 DIAGNOSIS — E119 Type 2 diabetes mellitus without complications: Secondary | ICD-10-CM | POA: Diagnosis not present

## 2016-10-02 DIAGNOSIS — E785 Hyperlipidemia, unspecified: Secondary | ICD-10-CM | POA: Diagnosis not present

## 2016-10-02 DIAGNOSIS — D649 Anemia, unspecified: Secondary | ICD-10-CM | POA: Diagnosis not present

## 2016-10-02 DIAGNOSIS — E1165 Type 2 diabetes mellitus with hyperglycemia: Secondary | ICD-10-CM | POA: Diagnosis not present

## 2016-10-02 DIAGNOSIS — M5414 Radiculopathy, thoracic region: Secondary | ICD-10-CM | POA: Diagnosis not present

## 2016-10-02 DIAGNOSIS — M79603 Pain in arm, unspecified: Secondary | ICD-10-CM | POA: Diagnosis not present

## 2016-10-02 DIAGNOSIS — R739 Hyperglycemia, unspecified: Secondary | ICD-10-CM | POA: Diagnosis not present

## 2016-10-02 DIAGNOSIS — I10 Essential (primary) hypertension: Secondary | ICD-10-CM | POA: Diagnosis not present

## 2017-01-08 DIAGNOSIS — I10 Essential (primary) hypertension: Secondary | ICD-10-CM | POA: Diagnosis not present

## 2017-01-08 DIAGNOSIS — E785 Hyperlipidemia, unspecified: Secondary | ICD-10-CM | POA: Diagnosis not present

## 2017-01-08 DIAGNOSIS — E1165 Type 2 diabetes mellitus with hyperglycemia: Secondary | ICD-10-CM | POA: Diagnosis not present

## 2017-04-10 DIAGNOSIS — E1165 Type 2 diabetes mellitus with hyperglycemia: Secondary | ICD-10-CM | POA: Diagnosis not present

## 2017-04-10 DIAGNOSIS — I10 Essential (primary) hypertension: Secondary | ICD-10-CM | POA: Diagnosis not present

## 2017-06-10 ENCOUNTER — Other Ambulatory Visit: Payer: Self-pay

## 2017-07-10 DIAGNOSIS — E1165 Type 2 diabetes mellitus with hyperglycemia: Secondary | ICD-10-CM | POA: Diagnosis not present

## 2017-07-10 DIAGNOSIS — E785 Hyperlipidemia, unspecified: Secondary | ICD-10-CM | POA: Diagnosis not present

## 2017-08-29 DIAGNOSIS — Z23 Encounter for immunization: Secondary | ICD-10-CM | POA: Diagnosis not present

## 2017-10-08 DIAGNOSIS — M5414 Radiculopathy, thoracic region: Secondary | ICD-10-CM | POA: Diagnosis not present

## 2017-10-08 DIAGNOSIS — R739 Hyperglycemia, unspecified: Secondary | ICD-10-CM | POA: Diagnosis not present

## 2017-10-08 DIAGNOSIS — E785 Hyperlipidemia, unspecified: Secondary | ICD-10-CM | POA: Diagnosis not present

## 2017-10-08 DIAGNOSIS — D649 Anemia, unspecified: Secondary | ICD-10-CM | POA: Diagnosis not present

## 2017-10-08 DIAGNOSIS — M79603 Pain in arm, unspecified: Secondary | ICD-10-CM | POA: Diagnosis not present

## 2017-10-08 DIAGNOSIS — Z1389 Encounter for screening for other disorder: Secondary | ICD-10-CM | POA: Diagnosis not present

## 2017-10-08 DIAGNOSIS — E119 Type 2 diabetes mellitus without complications: Secondary | ICD-10-CM | POA: Diagnosis not present

## 2017-10-08 DIAGNOSIS — I10 Essential (primary) hypertension: Secondary | ICD-10-CM | POA: Diagnosis not present

## 2017-10-08 DIAGNOSIS — E1165 Type 2 diabetes mellitus with hyperglycemia: Secondary | ICD-10-CM | POA: Diagnosis not present

## 2017-10-15 ENCOUNTER — Encounter: Payer: Self-pay | Admitting: Gastroenterology

## 2017-11-18 DIAGNOSIS — M5414 Radiculopathy, thoracic region: Secondary | ICD-10-CM | POA: Diagnosis not present

## 2017-11-18 DIAGNOSIS — I10 Essential (primary) hypertension: Secondary | ICD-10-CM | POA: Diagnosis not present

## 2017-11-18 DIAGNOSIS — E1165 Type 2 diabetes mellitus with hyperglycemia: Secondary | ICD-10-CM | POA: Diagnosis not present

## 2017-11-18 DIAGNOSIS — E785 Hyperlipidemia, unspecified: Secondary | ICD-10-CM | POA: Diagnosis not present

## 2017-12-05 ENCOUNTER — Ambulatory Visit (INDEPENDENT_AMBULATORY_CARE_PROVIDER_SITE_OTHER): Payer: Medicare Other | Admitting: Otolaryngology

## 2017-12-05 DIAGNOSIS — H903 Sensorineural hearing loss, bilateral: Secondary | ICD-10-CM | POA: Diagnosis not present

## 2017-12-29 DIAGNOSIS — H8110 Benign paroxysmal vertigo, unspecified ear: Secondary | ICD-10-CM | POA: Diagnosis not present

## 2018-01-07 DIAGNOSIS — E785 Hyperlipidemia, unspecified: Secondary | ICD-10-CM | POA: Diagnosis not present

## 2018-01-07 DIAGNOSIS — N400 Enlarged prostate without lower urinary tract symptoms: Secondary | ICD-10-CM | POA: Diagnosis not present

## 2018-01-07 DIAGNOSIS — K219 Gastro-esophageal reflux disease without esophagitis: Secondary | ICD-10-CM | POA: Diagnosis not present

## 2018-01-07 DIAGNOSIS — E1165 Type 2 diabetes mellitus with hyperglycemia: Secondary | ICD-10-CM | POA: Diagnosis not present

## 2018-02-11 DIAGNOSIS — M5414 Radiculopathy, thoracic region: Secondary | ICD-10-CM | POA: Diagnosis not present

## 2018-02-11 DIAGNOSIS — E785 Hyperlipidemia, unspecified: Secondary | ICD-10-CM | POA: Diagnosis not present

## 2018-02-11 DIAGNOSIS — K219 Gastro-esophageal reflux disease without esophagitis: Secondary | ICD-10-CM | POA: Diagnosis not present

## 2018-02-11 DIAGNOSIS — E1165 Type 2 diabetes mellitus with hyperglycemia: Secondary | ICD-10-CM | POA: Diagnosis not present

## 2018-02-11 DIAGNOSIS — N400 Enlarged prostate without lower urinary tract symptoms: Secondary | ICD-10-CM | POA: Diagnosis not present

## 2018-02-20 DIAGNOSIS — E119 Type 2 diabetes mellitus without complications: Secondary | ICD-10-CM | POA: Diagnosis not present

## 2018-03-19 DIAGNOSIS — T148XXA Other injury of unspecified body region, initial encounter: Secondary | ICD-10-CM | POA: Diagnosis not present

## 2018-03-19 DIAGNOSIS — E1165 Type 2 diabetes mellitus with hyperglycemia: Secondary | ICD-10-CM | POA: Diagnosis not present

## 2018-03-19 DIAGNOSIS — Z23 Encounter for immunization: Secondary | ICD-10-CM | POA: Diagnosis not present

## 2018-05-13 DIAGNOSIS — E1165 Type 2 diabetes mellitus with hyperglycemia: Secondary | ICD-10-CM | POA: Diagnosis not present

## 2018-05-13 DIAGNOSIS — E785 Hyperlipidemia, unspecified: Secondary | ICD-10-CM | POA: Diagnosis not present

## 2018-05-13 DIAGNOSIS — R42 Dizziness and giddiness: Secondary | ICD-10-CM | POA: Diagnosis not present

## 2018-05-13 DIAGNOSIS — K219 Gastro-esophageal reflux disease without esophagitis: Secondary | ICD-10-CM | POA: Diagnosis not present

## 2018-05-18 ENCOUNTER — Encounter (HOSPITAL_COMMUNITY): Payer: Self-pay | Admitting: Emergency Medicine

## 2018-05-18 ENCOUNTER — Emergency Department (HOSPITAL_COMMUNITY)
Admission: EM | Admit: 2018-05-18 | Discharge: 2018-05-19 | Disposition: A | Discharge status: Home / Self Care | Payer: Medicare Other | Attending: Emergency Medicine | Admitting: Emergency Medicine

## 2018-05-18 ENCOUNTER — Other Ambulatory Visit: Payer: Self-pay

## 2018-05-18 DIAGNOSIS — Z87891 Personal history of nicotine dependence: Secondary | ICD-10-CM | POA: Insufficient documentation

## 2018-05-18 DIAGNOSIS — I951 Orthostatic hypotension: Secondary | ICD-10-CM

## 2018-05-18 DIAGNOSIS — R42 Dizziness and giddiness: Secondary | ICD-10-CM | POA: Diagnosis not present

## 2018-05-18 DIAGNOSIS — Z7982 Long term (current) use of aspirin: Secondary | ICD-10-CM | POA: Diagnosis not present

## 2018-05-18 DIAGNOSIS — E119 Type 2 diabetes mellitus without complications: Secondary | ICD-10-CM | POA: Insufficient documentation

## 2018-05-18 DIAGNOSIS — Z7984 Long term (current) use of oral hypoglycemic drugs: Secondary | ICD-10-CM | POA: Insufficient documentation

## 2018-05-18 LAB — COMPREHENSIVE METABOLIC PANEL
ALK PHOS: 67 U/L (ref 38–126)
ALT: 13 U/L (ref 0–44)
ANION GAP: 10 (ref 5–15)
AST: 22 U/L (ref 15–41)
Albumin: 3.5 g/dL (ref 3.5–5.0)
BILIRUBIN TOTAL: 0.5 mg/dL (ref 0.3–1.2)
BUN: 25 mg/dL — AB (ref 8–23)
CALCIUM: 9.3 mg/dL (ref 8.9–10.3)
CO2: 24 mmol/L (ref 22–32)
Chloride: 103 mmol/L (ref 98–111)
Creatinine, Ser: 1.23 mg/dL (ref 0.61–1.24)
GFR calc Af Amer: >60 mL/min (ref >60)
GFR calc non Af Amer: 52 mL/min — ABNORMAL LOW (ref >60)
GLUCOSE: 113 mg/dL — AB (ref 70–99)
POTASSIUM: 4.7 mmol/L (ref 3.5–5.1)
Sodium: 137 mmol/L (ref 135–145)
TOTAL PROTEIN: 7.3 g/dL (ref 6.5–8.1)

## 2018-05-18 LAB — CBC
HEMATOCRIT: 36.9 % — AB (ref 39.0–52.0)
HEMOGLOBIN: 10.8 g/dL — AB (ref 13.0–17.0)
MCH: 19.8 pg — ABNORMAL LOW (ref 26.0–34.0)
MCHC: 29.3 g/dL — AB (ref 30.0–36.0)
MCV: 67.6 fL — ABNORMAL LOW (ref 78.0–100.0)
Platelets: 333 10*3/uL (ref 150–400)
RBC: 5.46 MIL/uL (ref 4.22–5.81)
RDW: 19.1 % — AB (ref 11.5–15.5)
WBC: 7.3 10*3/uL (ref 4.0–10.5)

## 2018-05-18 NOTE — ED Triage Notes (Signed)
Pt complaining of dizziness, BP at home lying 92/38 sitting  70/36  Standing 72/38

## 2018-05-19 DIAGNOSIS — I951 Orthostatic hypotension: Secondary | ICD-10-CM | POA: Diagnosis not present

## 2018-05-19 LAB — URINALYSIS, ROUTINE W REFLEX MICROSCOPIC
Bilirubin Urine: NEGATIVE
GLUCOSE, UA: NEGATIVE mg/dL
Hgb urine dipstick: NEGATIVE
KETONES UR: NEGATIVE mg/dL
Leukocytes, UA: NEGATIVE
NITRITE: NEGATIVE
PH: 5 (ref 5.0–8.0)
Protein, ur: NEGATIVE mg/dL
Specific Gravity, Urine: 1.004 — ABNORMAL LOW (ref 1.005–1.030)

## 2018-05-19 LAB — I-STAT TROPONIN, ED: TROPONIN I, POC: 0 ng/mL (ref 0.00–0.08)

## 2018-05-19 LAB — I-STAT CG4 LACTIC ACID, ED: LACTIC ACID, VENOUS: 1.69 mmol/L (ref 0.5–1.9)

## 2018-05-19 MED ORDER — SODIUM CHLORIDE 0.9 % IV BOLUS
1000.0000 mL | Freq: Once | INTRAVENOUS | Status: AC
  Administered 2018-05-19: 1000 mL via INTRAVENOUS

## 2018-05-19 NOTE — Discharge Instructions (Addendum)
Increase the amount of water you drink each day.  Call Dr. Legrand Rams today and schedule a follow-up appointment in his office this week.  Return to the ER if your symptoms worsen.

## 2018-05-19 NOTE — ED Notes (Signed)
EDP notified of orthostatic v/s

## 2018-05-19 NOTE — ED Provider Notes (Signed)
[facility] EMERGENCY DEPARTMENT Provider Note   CSN: [id number] Arrival date & time: 05/18/18  1956     History   Chief Complaint Chief Complaint  Patient presents with  . Hypotension    HPI Jose Chase is a 84 y.o. male.  Patient presents to the emergency department for evaluation of dizziness.  He has been feeling weak and when he stands up and tries to walk he feels dizzy.  This has been ongoing for several weeks.  His granddaughter took his blood pressure tonight and it was low.  He has not had any bleeding.  He denies nausea, vomiting, diarrhea.  He has not had any fever.  He does admit that he has not been drinking well.  No chest pain or shortness of breath.  He has not had a headache or stroke symptoms with his dizziness.  Dizziness only occurs when he stands up.     Past Medical History:  Diagnosis Date  . Aspirin long-term use   . DM (diabetes mellitus) ([place])   . Hemorrhoids, internal Dec 2008  . Hiatal hernia DEC 2008 LARGE  . Prostate cancer [facility])     Patient Active Problem List   Diagnosis Date Noted  . Loss of weight 01/12/2016  . Gastritis and gastroduodenitis 02/23/2014  . Dysphagia 05/21/2011    Past Surgical History:  Procedure Laterality Date  . BACK SURGERY    . COLONOSCOPY  DEC 2979   COMPLICATED BY CONTAINED RECTAL PERFORATION  . ESOPHAGOGASTRODUODENOSCOPY  05/29/2011   Procedure: ESOPHAGOGASTRODUODENOSCOPY (EGD);  Surgeon: Dorothyann Peng, MD;  Location: [facility];  Service: Endoscopy;  Laterality: N/A;  Possible ED  . ESOPHAGOGASTRODUODENOSCOPY N/A 02/07/2016   Procedure: ESOPHAGOGASTRODUODENOSCOPY (EGD);  Surgeon: Danie Binder, MD;  Location: [facility];  Service: Endoscopy;  Laterality: N/A;  200 - moved to 3/28 @ 10:30 - office notified pt  . ESOPHAGOGASTRODUODENOSCOPY (EGD) WITH ESOPHAGEAL DILATION N/A 10/20/2013   GXQ:JJHERDEYC at the gastroesophageal junction/ Medium sized hiatal hernia/MILD Erosive gastritis. Biopsy showed  chronic inflammation, no H. pylori, fragment of fundic gland polyp  . MALONEY DILATION  05/29/2011   Procedure: [name] DILATION;  Surgeon: Dorothyann Peng, MD;  Location: [facility];  Service: Endoscopy;  Laterality: N/A;  . PROSTATE SURGERY     prostate cancer  . SAVORY DILATION  05/29/2011   Procedure: SAVORY DILATION;  Surgeon: Dorothyann Peng, MD;  Location: [facility];  Service: Endoscopy;  Laterality: N/A;  . SAVORY DILATION N/A 02/07/2016   Procedure: SAVORY DILATION;  Surgeon: Danie Binder, MD;  Location: [facility];  Service: Endoscopy;  Laterality: N/A;  . UPPER GASTROINTESTINAL ENDOSCOPY  DEC 2008   XK:GYJEHU GASTRIC MUCOSA        Home Medications    Prior to Admission medications   Medication Sig Start Date End Date Taking? Authorizing Provider  aspirin EC 81 MG tablet Take 81 mg by mouth daily.    [provider]  glipiZIDE (GLUCOTROL) 10 MG tablet Take 10 mg by mouth daily before breakfast.    [provider]  metFORMIN (GLUCOPHAGE) 500 MG tablet Take 1,000 mg by mouth 2 (two) times daily with a meal.     [provider]  Multiple Vitamins-Minerals (MEGA MULTI MEN PO) Take by mouth. Takes one tablet daily    [provider]  omeprazole (PRILOSEC) 20 MG capsule 1 PO 30 MINS PRIOR TO BREAKFAST and supper 05/02/16   Mahala Menghini, PA-C  simvastatin (  ZOCOR) 20 MG tablet Take 20 mg by mouth at bedtime.      [provider]  tamsulosin (FLOMAX) 0.4 MG CAPS capsule Take 0.4 mg by mouth daily.    [provider]    Family History Family History  Problem Relation Age of Onset  . Heart disease Father   . Heart disease Brother   . Cancer Brother   . Cancer Brother   . Cancer Other   . Cancer Sister   . Colon cancer Neg Hx   . Colon polyps Neg Hx     Social History Social History   Tobacco Use  . Smoking status: Former Smoker    Types: Cigarettes    Last attempt to quit: 01/11/1973    Years since quitting:  45.3  . Smokeless tobacco: Never Used  . Tobacco comment: Never smoked very much. Occasional smoker-once or twice a week. Quit in 1974.  Substance Use Topics  . Alcohol use: No    Alcohol/week: 0.0 oz  . Drug use: No     Allergies   Patient has no known allergies.   Review of Systems Review of Systems  Neurological: Positive for dizziness and weakness.  All other systems reviewed and are negative.    Physical Exam Updated Vital Signs BP (!) 100/52   Pulse (!) 51   Temp 98.2 F (36.8 C) (Oral)   Resp 18   Ht 6' (1.829 m)   Wt 73 kg (161 lb)   SpO2 100%   BMI 21.84 kg/m   Physical Exam  Constitutional: He is oriented to person, place, and time. He appears well-developed and well-nourished. No distress.  HENT:  Head: Normocephalic and atraumatic.  Right Ear: Hearing normal.  Left Ear: Hearing normal.  Nose: Nose normal.  Mouth/Throat: Oropharynx is clear and moist and mucous membranes are normal.  Eyes: Pupils are equal, round, and reactive to light. Conjunctivae and EOM are normal.  Neck: Normal range of motion. Neck supple.  Cardiovascular: Regular rhythm, S1 normal and S2 normal. Exam reveals no gallop and no friction rub.  No murmur heard. Pulmonary/Chest: Effort normal and breath sounds normal. No respiratory distress. He exhibits no tenderness.  Abdominal: Soft. Normal appearance and bowel sounds are normal. There is no hepatosplenomegaly. There is no tenderness. There is no rebound, no guarding, no tenderness at McBurney's point and negative Murphy's sign. No hernia.  Musculoskeletal: Normal range of motion.  Neurological: He is alert and oriented to person, place, and time. He has normal strength. No cranial nerve deficit or sensory deficit. Coordination normal. GCS eye subscore is 4. GCS verbal subscore is 5. GCS motor subscore is 6.  Skin: Skin is warm, dry and intact. No rash noted. No cyanosis.  Psychiatric: He has a normal mood and affect. His speech is  normal and behavior is normal. Thought content normal.  Nursing note and vitals reviewed.    ED Treatments / Results  Labs (all labs ordered are listed, but only abnormal results are displayed) Labs Reviewed  COMPREHENSIVE METABOLIC PANEL - Abnormal; Notable for the following components:      Result Value   Glucose, Bld 113 (*)    BUN 25 (*)    GFR calc non Af Amer 52 (*)    All other components within normal limits  CBC - Abnormal; Notable for the following components:   Hemoglobin 10.8 (*)    HCT 36.9 (*)    MCV 67.6 (*)    MCH 19.8 (*)  MCHC 29.3 (*)    RDW 19.1 (*)    All other components within normal limits  URINALYSIS, ROUTINE W REFLEX MICROSCOPIC - Abnormal; Notable for the following components:   Color, Urine STRAW (*)    Specific Gravity, Urine 1.004 (*)    All other components within normal limits  I-STAT CG4 LACTIC ACID, ED  I-STAT TROPONIN, ED    EKG EKG Interpretation  Date/Time:  Monday May 19 2018 00:28:25 EDT Ventricular Rate:  57 PR Interval:    QRS Duration: 100 QT Interval:  438 QTC Calculation: 427 R Axis:   -2 Text Interpretation:  Sinus rhythm Prolonged PR interval Low voltage, precordial leads Confirmed by Orpah Greek [phone]) on 05/19/2018 4:05:07 AM   Radiology No results found.  Procedures Procedures (including critical care time)  Medications Ordered in ED Medications  sodium chloride 0.9 % bolus 1,000 mL (0 mLs Intravenous Stopped 05/19/18 0215)  sodium chloride 0.9 % bolus 1,000 mL (0 mLs Intravenous Stopped 05/19/18 0433)     Initial Impression / Assessment and Plan / ED Course  I have reviewed the triage vital signs and the nursing notes.  Pertinent labs & imaging results that were available during my care of the patient were reviewed by me and considered in my medical decision making (see chart for details).     Patient presents to the ER for evaluation of weakness and dizziness, specifically with standing and  walking.  Patient appears to be dehydrated.  He has an elevated BUN, is significantly orthostatic.  He admits that he has not been eating or drinking much.  Normally only drinks about 1 glass of water a day.  He does not have any infectious symptoms.  No fever.  Lab work unremarkable.  Patient administered IV fluids with significant improvement.  He is now ambulatory in the ER without any dizziness.  He will be discharged, continue oral hydration at home, follow-up with PCP in the office this week for recheck.  Final Clinical Impressions(s) / ED Diagnoses   Final diagnoses:  Orthostatic hypotension    ED Discharge Orders    None       Orpah Greek, MD 05/19/18 [phone]

## 2018-05-19 NOTE — ED Notes (Signed)
Reported v/s to EDP. Verbal order for IVF given. IVF started.

## 2018-08-14 DIAGNOSIS — R739 Hyperglycemia, unspecified: Secondary | ICD-10-CM | POA: Diagnosis not present

## 2018-08-14 DIAGNOSIS — E785 Hyperlipidemia, unspecified: Secondary | ICD-10-CM | POA: Diagnosis not present

## 2018-08-14 DIAGNOSIS — I10 Essential (primary) hypertension: Secondary | ICD-10-CM | POA: Diagnosis not present

## 2018-08-14 DIAGNOSIS — M79603 Pain in arm, unspecified: Secondary | ICD-10-CM | POA: Diagnosis not present

## 2018-08-14 DIAGNOSIS — N400 Enlarged prostate without lower urinary tract symptoms: Secondary | ICD-10-CM | POA: Diagnosis not present

## 2018-08-14 DIAGNOSIS — M5414 Radiculopathy, thoracic region: Secondary | ICD-10-CM | POA: Diagnosis not present

## 2018-08-14 DIAGNOSIS — E1165 Type 2 diabetes mellitus with hyperglycemia: Secondary | ICD-10-CM | POA: Diagnosis not present

## 2018-08-14 DIAGNOSIS — E119 Type 2 diabetes mellitus without complications: Secondary | ICD-10-CM | POA: Diagnosis not present

## 2018-08-14 DIAGNOSIS — K219 Gastro-esophageal reflux disease without esophagitis: Secondary | ICD-10-CM | POA: Diagnosis not present

## 2018-08-14 DIAGNOSIS — D649 Anemia, unspecified: Secondary | ICD-10-CM | POA: Diagnosis not present

## 2018-10-16 DIAGNOSIS — Z0000 Encounter for general adult medical examination without abnormal findings: Secondary | ICD-10-CM | POA: Diagnosis not present

## 2018-10-16 DIAGNOSIS — R739 Hyperglycemia, unspecified: Secondary | ICD-10-CM | POA: Diagnosis not present

## 2018-10-16 DIAGNOSIS — D649 Anemia, unspecified: Secondary | ICD-10-CM | POA: Diagnosis not present

## 2018-10-16 DIAGNOSIS — Z1331 Encounter for screening for depression: Secondary | ICD-10-CM | POA: Diagnosis not present

## 2018-10-16 DIAGNOSIS — N400 Enlarged prostate without lower urinary tract symptoms: Secondary | ICD-10-CM | POA: Diagnosis not present

## 2018-10-16 DIAGNOSIS — Z1389 Encounter for screening for other disorder: Secondary | ICD-10-CM | POA: Diagnosis not present

## 2018-10-16 DIAGNOSIS — I10 Essential (primary) hypertension: Secondary | ICD-10-CM | POA: Diagnosis not present

## 2018-10-16 DIAGNOSIS — M5414 Radiculopathy, thoracic region: Secondary | ICD-10-CM | POA: Diagnosis not present

## 2018-10-16 DIAGNOSIS — M79603 Pain in arm, unspecified: Secondary | ICD-10-CM | POA: Diagnosis not present

## 2018-10-16 DIAGNOSIS — E785 Hyperlipidemia, unspecified: Secondary | ICD-10-CM | POA: Diagnosis not present

## 2018-10-16 DIAGNOSIS — E119 Type 2 diabetes mellitus without complications: Secondary | ICD-10-CM | POA: Diagnosis not present

## 2018-10-16 DIAGNOSIS — E1165 Type 2 diabetes mellitus with hyperglycemia: Secondary | ICD-10-CM | POA: Diagnosis not present

## 2018-12-22 ENCOUNTER — Ambulatory Visit (INDEPENDENT_AMBULATORY_CARE_PROVIDER_SITE_OTHER): Payer: Medicare Other | Admitting: Otolaryngology

## 2018-12-22 DIAGNOSIS — H838X3 Other specified diseases of inner ear, bilateral: Secondary | ICD-10-CM

## 2018-12-22 DIAGNOSIS — H9313 Tinnitus, bilateral: Secondary | ICD-10-CM

## 2018-12-22 DIAGNOSIS — H903 Sensorineural hearing loss, bilateral: Secondary | ICD-10-CM

## 2018-12-22 DIAGNOSIS — R1312 Dysphagia, oropharyngeal phase: Secondary | ICD-10-CM | POA: Diagnosis not present

## 2018-12-22 DIAGNOSIS — R490 Dysphonia: Secondary | ICD-10-CM | POA: Diagnosis not present

## 2019-01-14 DIAGNOSIS — E1142 Type 2 diabetes mellitus with diabetic polyneuropathy: Secondary | ICD-10-CM | POA: Diagnosis not present

## 2019-01-14 DIAGNOSIS — K219 Gastro-esophageal reflux disease without esophagitis: Secondary | ICD-10-CM | POA: Diagnosis not present

## 2019-01-14 DIAGNOSIS — E785 Hyperlipidemia, unspecified: Secondary | ICD-10-CM | POA: Diagnosis not present

## 2019-01-14 DIAGNOSIS — N400 Enlarged prostate without lower urinary tract symptoms: Secondary | ICD-10-CM | POA: Diagnosis not present

## 2019-04-16 DIAGNOSIS — K219 Gastro-esophageal reflux disease without esophagitis: Secondary | ICD-10-CM | POA: Diagnosis not present

## 2019-04-16 DIAGNOSIS — E785 Hyperlipidemia, unspecified: Secondary | ICD-10-CM | POA: Diagnosis not present

## 2019-04-16 DIAGNOSIS — N400 Enlarged prostate without lower urinary tract symptoms: Secondary | ICD-10-CM | POA: Diagnosis not present

## 2019-04-16 DIAGNOSIS — E1165 Type 2 diabetes mellitus with hyperglycemia: Secondary | ICD-10-CM | POA: Diagnosis not present

## 2019-05-16 DIAGNOSIS — E785 Hyperlipidemia, unspecified: Secondary | ICD-10-CM | POA: Diagnosis not present

## 2019-05-16 DIAGNOSIS — E1165 Type 2 diabetes mellitus with hyperglycemia: Secondary | ICD-10-CM | POA: Diagnosis not present

## 2019-06-12 ENCOUNTER — Other Ambulatory Visit: Payer: Self-pay

## 2019-06-16 DIAGNOSIS — I10 Essential (primary) hypertension: Secondary | ICD-10-CM | POA: Diagnosis not present

## 2019-06-16 DIAGNOSIS — E785 Hyperlipidemia, unspecified: Secondary | ICD-10-CM | POA: Diagnosis not present

## 2019-07-17 DIAGNOSIS — K219 Gastro-esophageal reflux disease without esophagitis: Secondary | ICD-10-CM | POA: Diagnosis not present

## 2019-07-17 DIAGNOSIS — E785 Hyperlipidemia, unspecified: Secondary | ICD-10-CM | POA: Diagnosis not present

## 2019-08-04 DIAGNOSIS — Z23 Encounter for immunization: Secondary | ICD-10-CM | POA: Diagnosis not present

## 2019-08-14 DIAGNOSIS — E1165 Type 2 diabetes mellitus with hyperglycemia: Secondary | ICD-10-CM | POA: Diagnosis not present

## 2019-08-14 DIAGNOSIS — K219 Gastro-esophageal reflux disease without esophagitis: Secondary | ICD-10-CM | POA: Diagnosis not present

## 2019-10-15 DIAGNOSIS — E1165 Type 2 diabetes mellitus with hyperglycemia: Secondary | ICD-10-CM | POA: Diagnosis not present

## 2019-10-15 DIAGNOSIS — K219 Gastro-esophageal reflux disease without esophagitis: Secondary | ICD-10-CM | POA: Diagnosis not present

## 2019-10-15 DIAGNOSIS — E785 Hyperlipidemia, unspecified: Secondary | ICD-10-CM | POA: Diagnosis not present

## 2019-12-15 DIAGNOSIS — E1165 Type 2 diabetes mellitus with hyperglycemia: Secondary | ICD-10-CM | POA: Diagnosis not present

## 2019-12-15 DIAGNOSIS — K219 Gastro-esophageal reflux disease without esophagitis: Secondary | ICD-10-CM | POA: Diagnosis not present

## 2019-12-20 ENCOUNTER — Ambulatory Visit: Payer: Medicare Other

## 2020-01-12 DIAGNOSIS — E1165 Type 2 diabetes mellitus with hyperglycemia: Secondary | ICD-10-CM | POA: Diagnosis not present

## 2020-01-12 DIAGNOSIS — K219 Gastro-esophageal reflux disease without esophagitis: Secondary | ICD-10-CM | POA: Diagnosis not present

## 2020-02-12 DIAGNOSIS — E785 Hyperlipidemia, unspecified: Secondary | ICD-10-CM | POA: Diagnosis not present

## 2020-02-12 DIAGNOSIS — K219 Gastro-esophageal reflux disease without esophagitis: Secondary | ICD-10-CM | POA: Diagnosis not present

## 2020-02-26 ENCOUNTER — Other Ambulatory Visit (HOSPITAL_COMMUNITY): Payer: Self-pay | Admitting: Internal Medicine

## 2020-02-26 ENCOUNTER — Encounter (HOSPITAL_COMMUNITY): Payer: Self-pay

## 2020-02-26 ENCOUNTER — Ambulatory Visit (HOSPITAL_COMMUNITY)
Admission: RE | Admit: 2020-02-26 | Discharge: 2020-02-26 | Disposition: A | Discharge status: Home / Self Care | Payer: Medicare Other | Source: Ambulatory Visit | Attending: Internal Medicine | Admitting: Internal Medicine

## 2020-02-26 ENCOUNTER — Other Ambulatory Visit: Payer: Self-pay

## 2020-02-26 DIAGNOSIS — M79601 Pain in right arm: Secondary | ICD-10-CM | POA: Diagnosis not present

## 2020-02-26 DIAGNOSIS — M19021 Primary osteoarthritis, right elbow: Secondary | ICD-10-CM | POA: Diagnosis not present

## 2020-02-26 DIAGNOSIS — M25511 Pain in right shoulder: Secondary | ICD-10-CM | POA: Insufficient documentation

## 2020-02-26 DIAGNOSIS — M19011 Primary osteoarthritis, right shoulder: Secondary | ICD-10-CM | POA: Diagnosis not present

## 2020-02-26 DIAGNOSIS — M79621 Pain in right upper arm: Secondary | ICD-10-CM | POA: Diagnosis not present

## 2020-03-13 DIAGNOSIS — E785 Hyperlipidemia, unspecified: Secondary | ICD-10-CM | POA: Diagnosis not present

## 2020-03-13 DIAGNOSIS — K219 Gastro-esophageal reflux disease without esophagitis: Secondary | ICD-10-CM | POA: Diagnosis not present

## 2020-04-18 DIAGNOSIS — K219 Gastro-esophageal reflux disease without esophagitis: Secondary | ICD-10-CM | POA: Diagnosis not present

## 2020-04-18 DIAGNOSIS — N400 Enlarged prostate without lower urinary tract symptoms: Secondary | ICD-10-CM | POA: Diagnosis not present

## 2020-04-18 DIAGNOSIS — E1165 Type 2 diabetes mellitus with hyperglycemia: Secondary | ICD-10-CM | POA: Diagnosis not present

## 2020-05-25 IMAGING — DX DG SHOULDER 2+V*R*
3 series · 3 of 3 positions shown · non-contrast
Comparison: None.

CLINICAL DATA: Right shoulder and right humeral pain, fell last
month

EXAM:
RIGHT SHOULDER - 2+ VIEW; RIGHT HUMERUS - 2+ VIEW

[shoulder grashey]
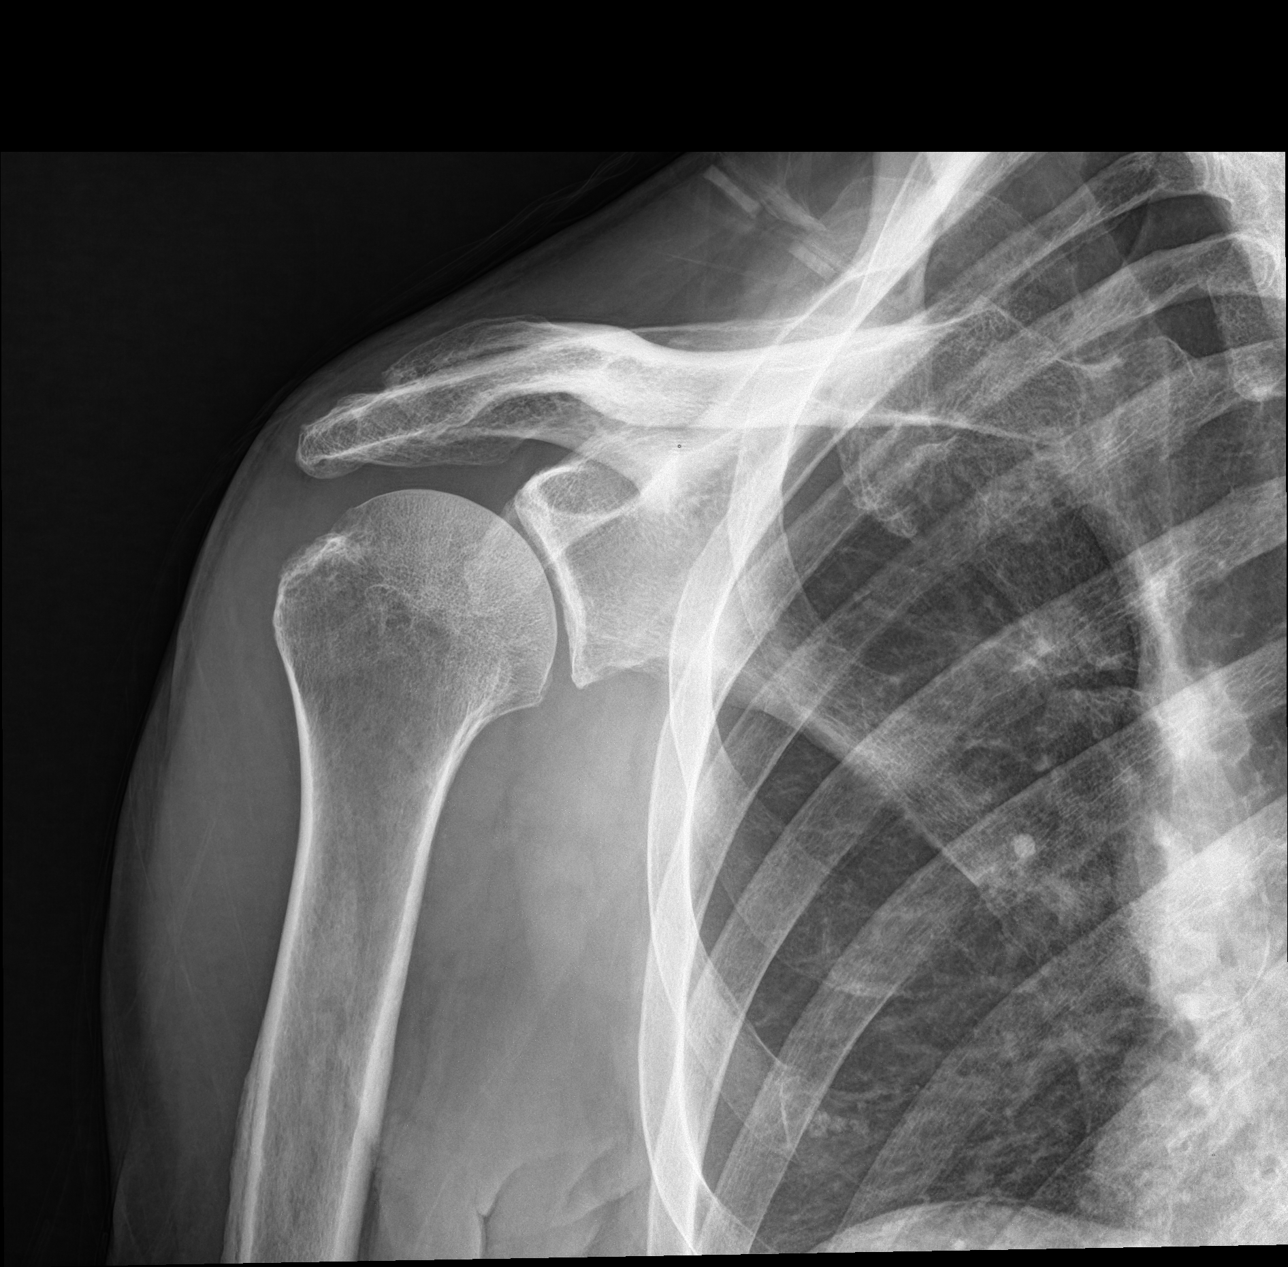

[shoulder y view]
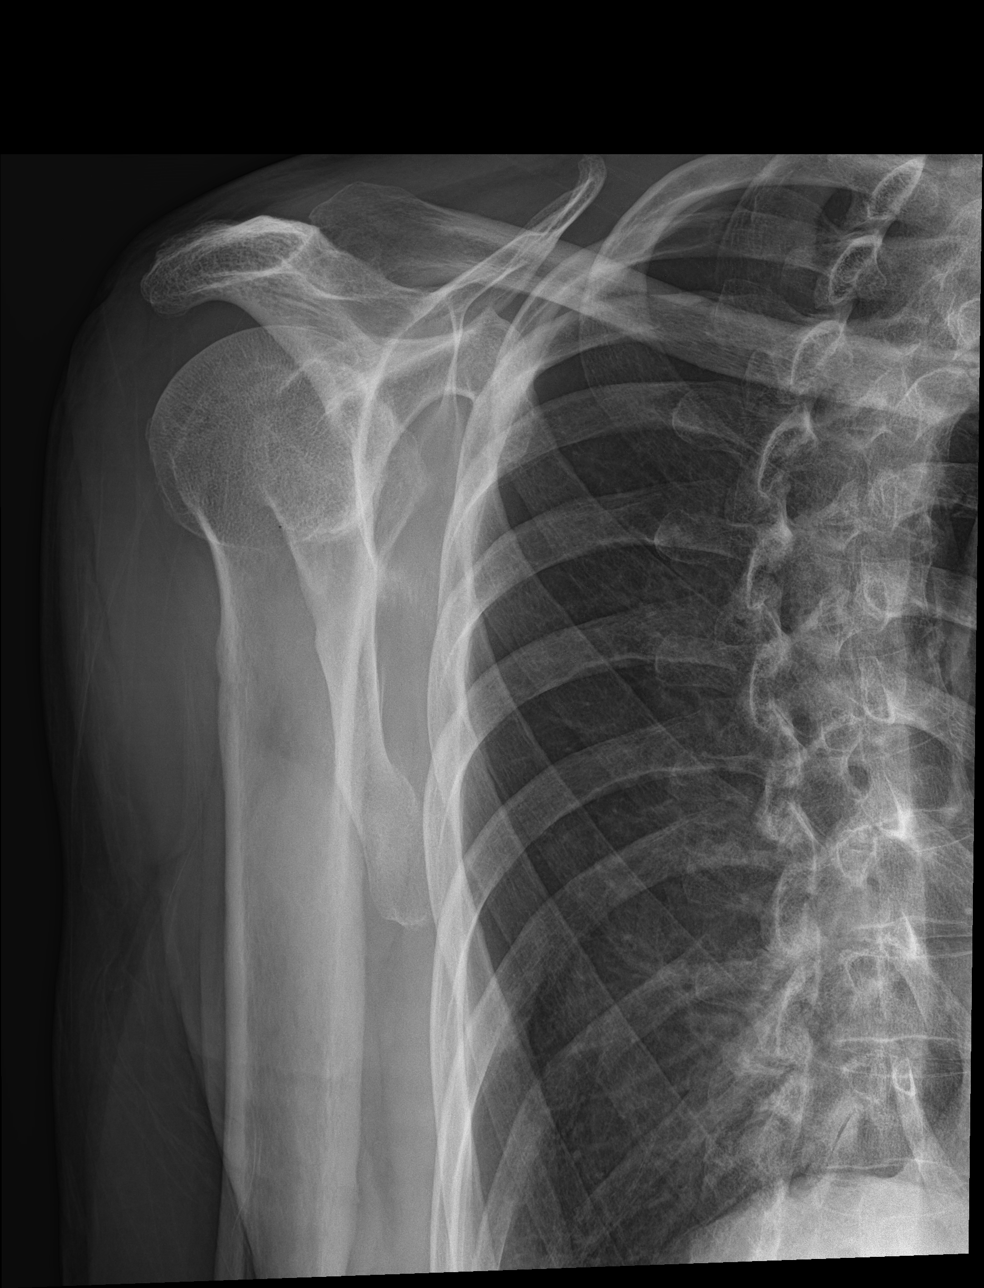

[shoulder axillary]
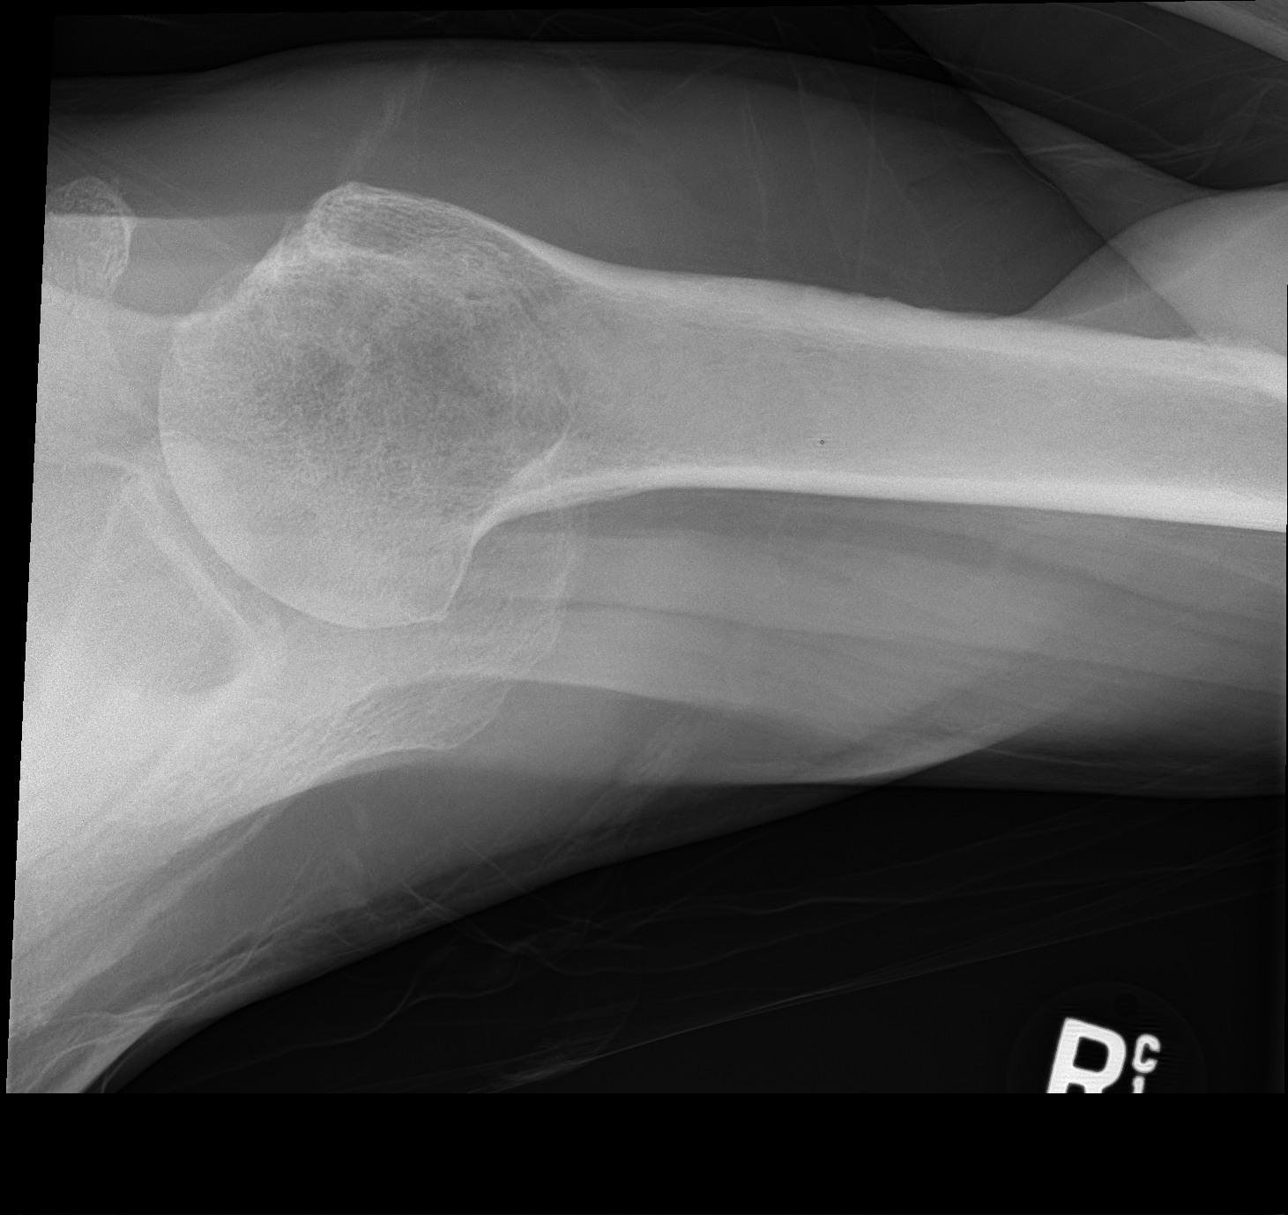

[3 of 3 positions shown; findings below may reference images not displayed]

FINDINGS: Right shoulder: Frontal, transscapular, and axillary views of the
right shoulder are obtained. No fracture, subluxation, or
dislocation. Moderate osteoarthritis of the glenohumeral and
acromioclavicular joints. Right chest is clear.

Right humerus: Frontal and lateral views demonstrate no fractures.
Moderate osteoarthritis of the right shoulder. Mild osteoarthritis
of the right elbow. The soft tissues are unremarkable.
IMPRESSION: 1. No acute fracture.
2. Moderate right shoulder osteoarthritis.
3. Mild right elbow osteoarthritis.

## 2020-05-25 IMAGING — DX DG HUMERUS 2V *R*
3 series · 3 of 3 positions shown · non-contrast
Comparison: None.

CLINICAL DATA: Right shoulder and right humeral pain, fell last
month

EXAM:
RIGHT SHOULDER - 2+ VIEW; RIGHT HUMERUS - 2+ VIEW

[humerus ap (1 of 2)]
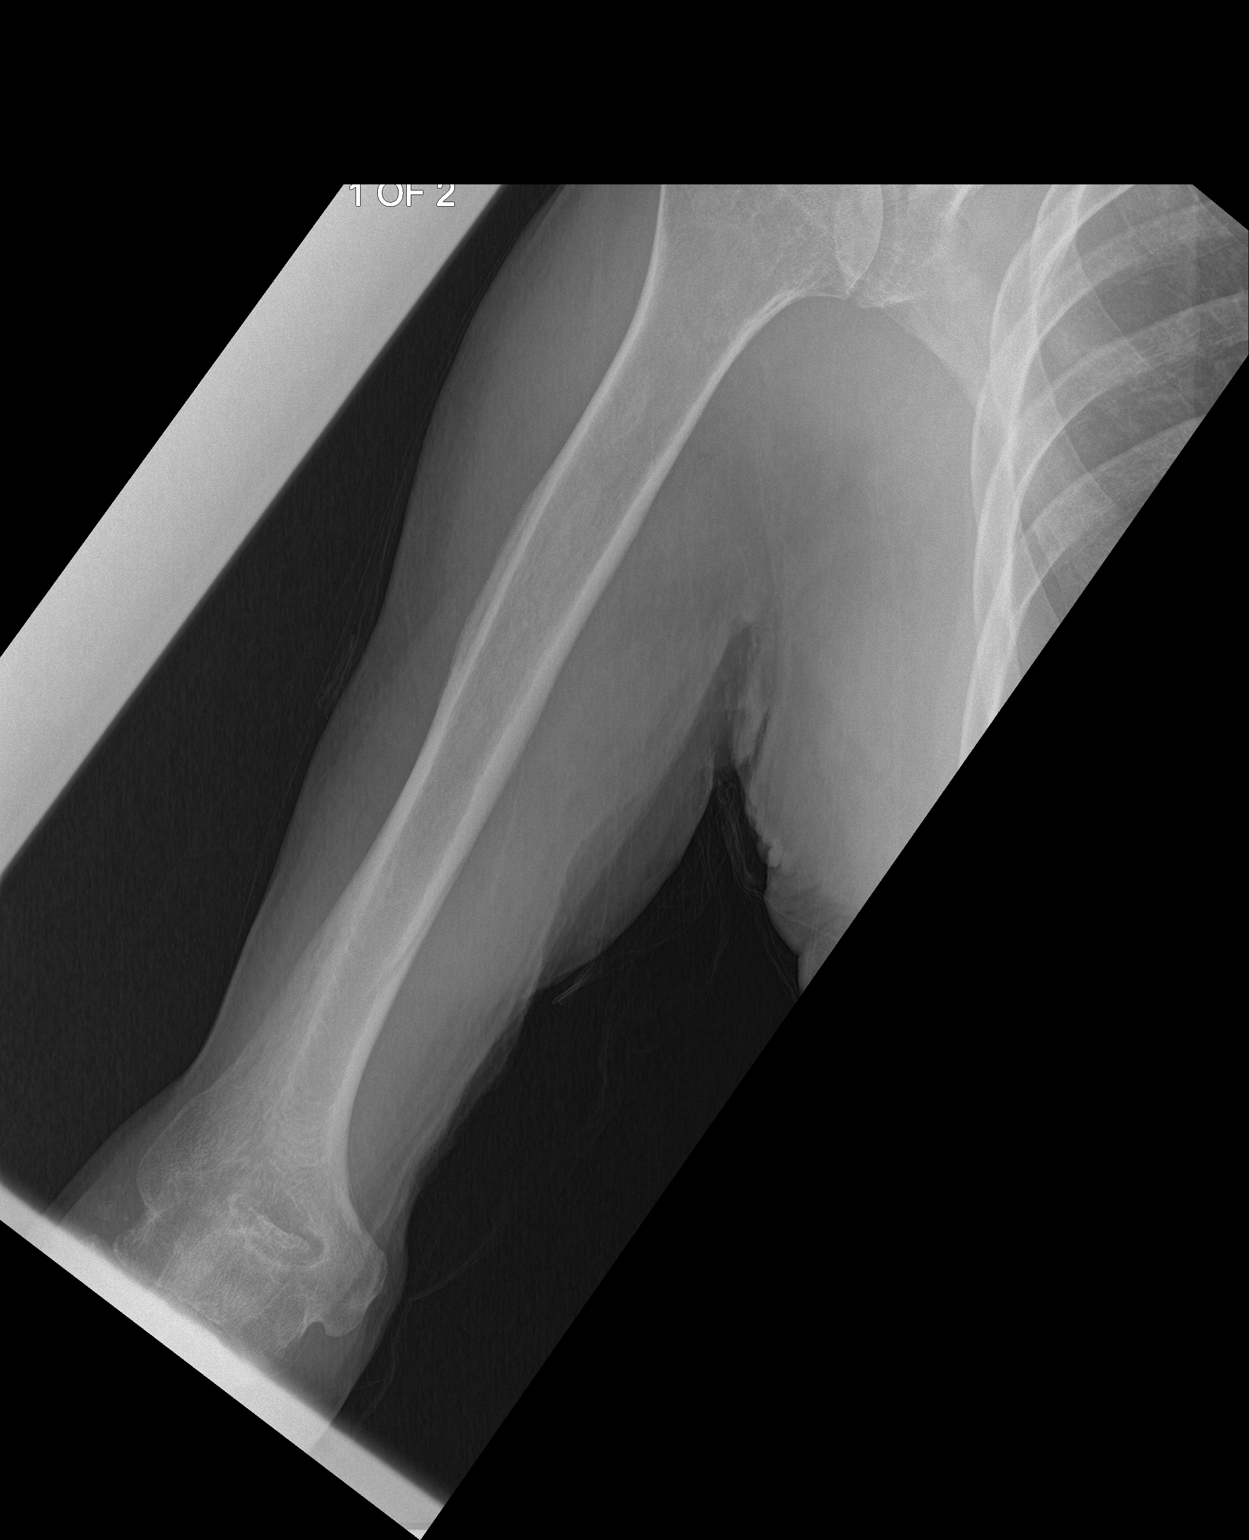

[humerus ap (2 of 2)]
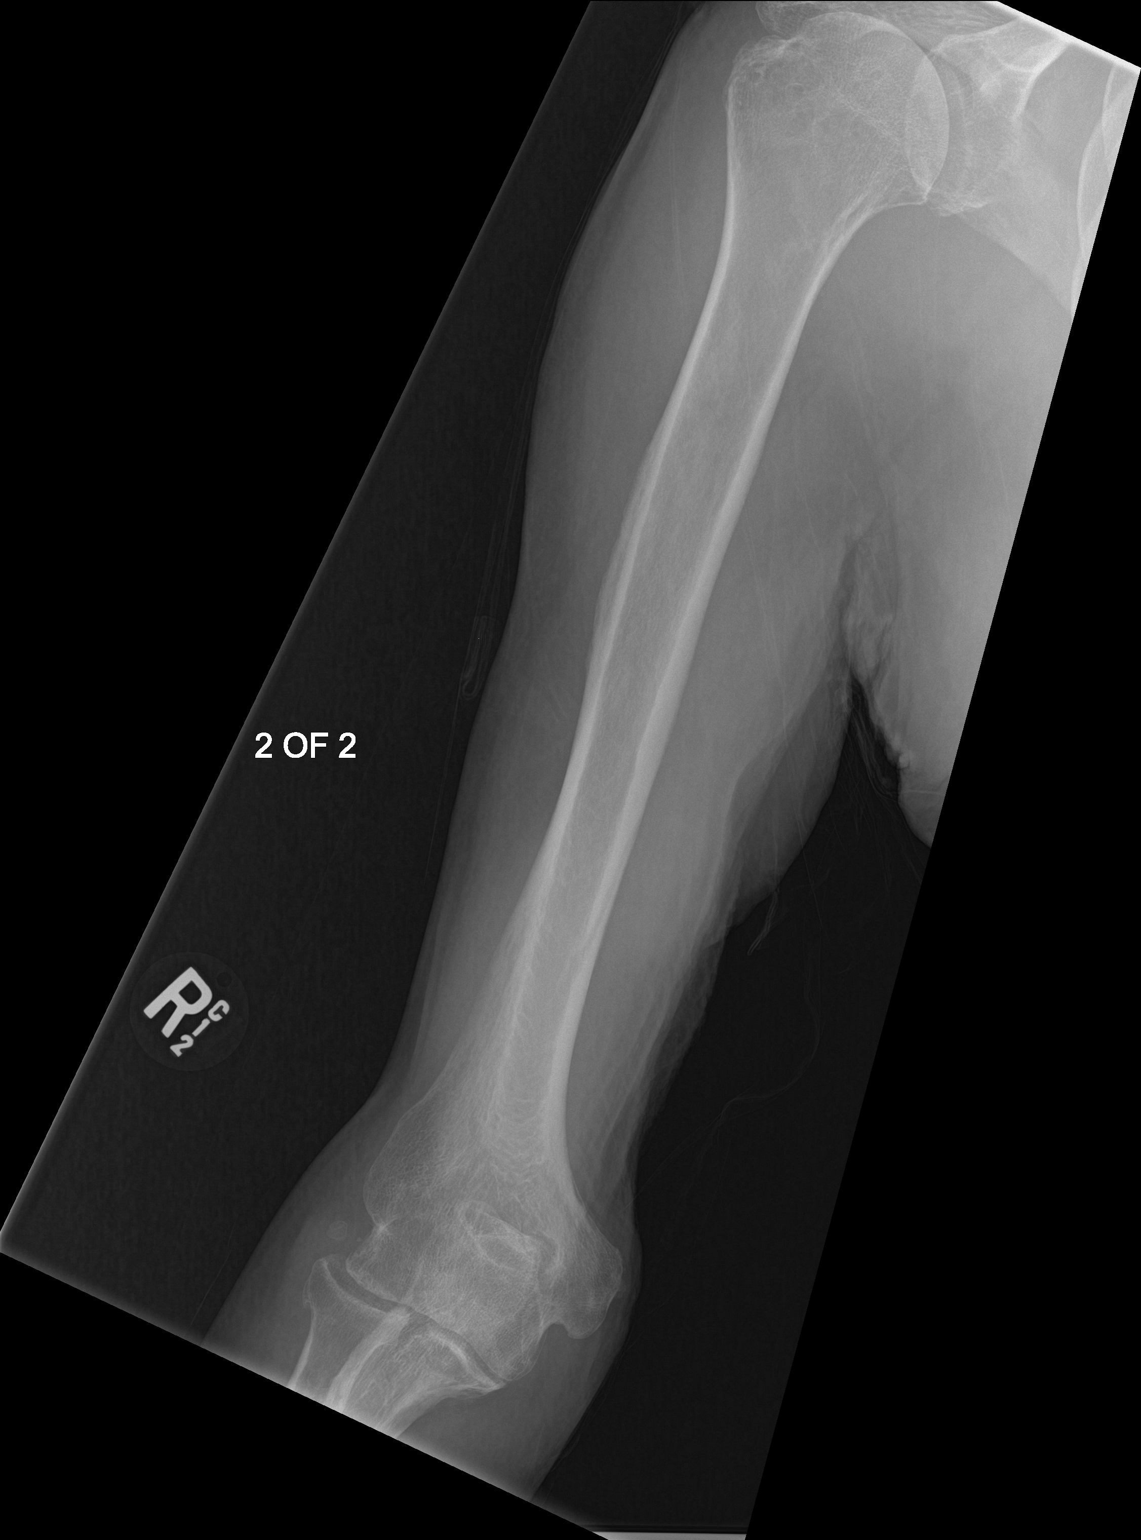

[humerus lat]
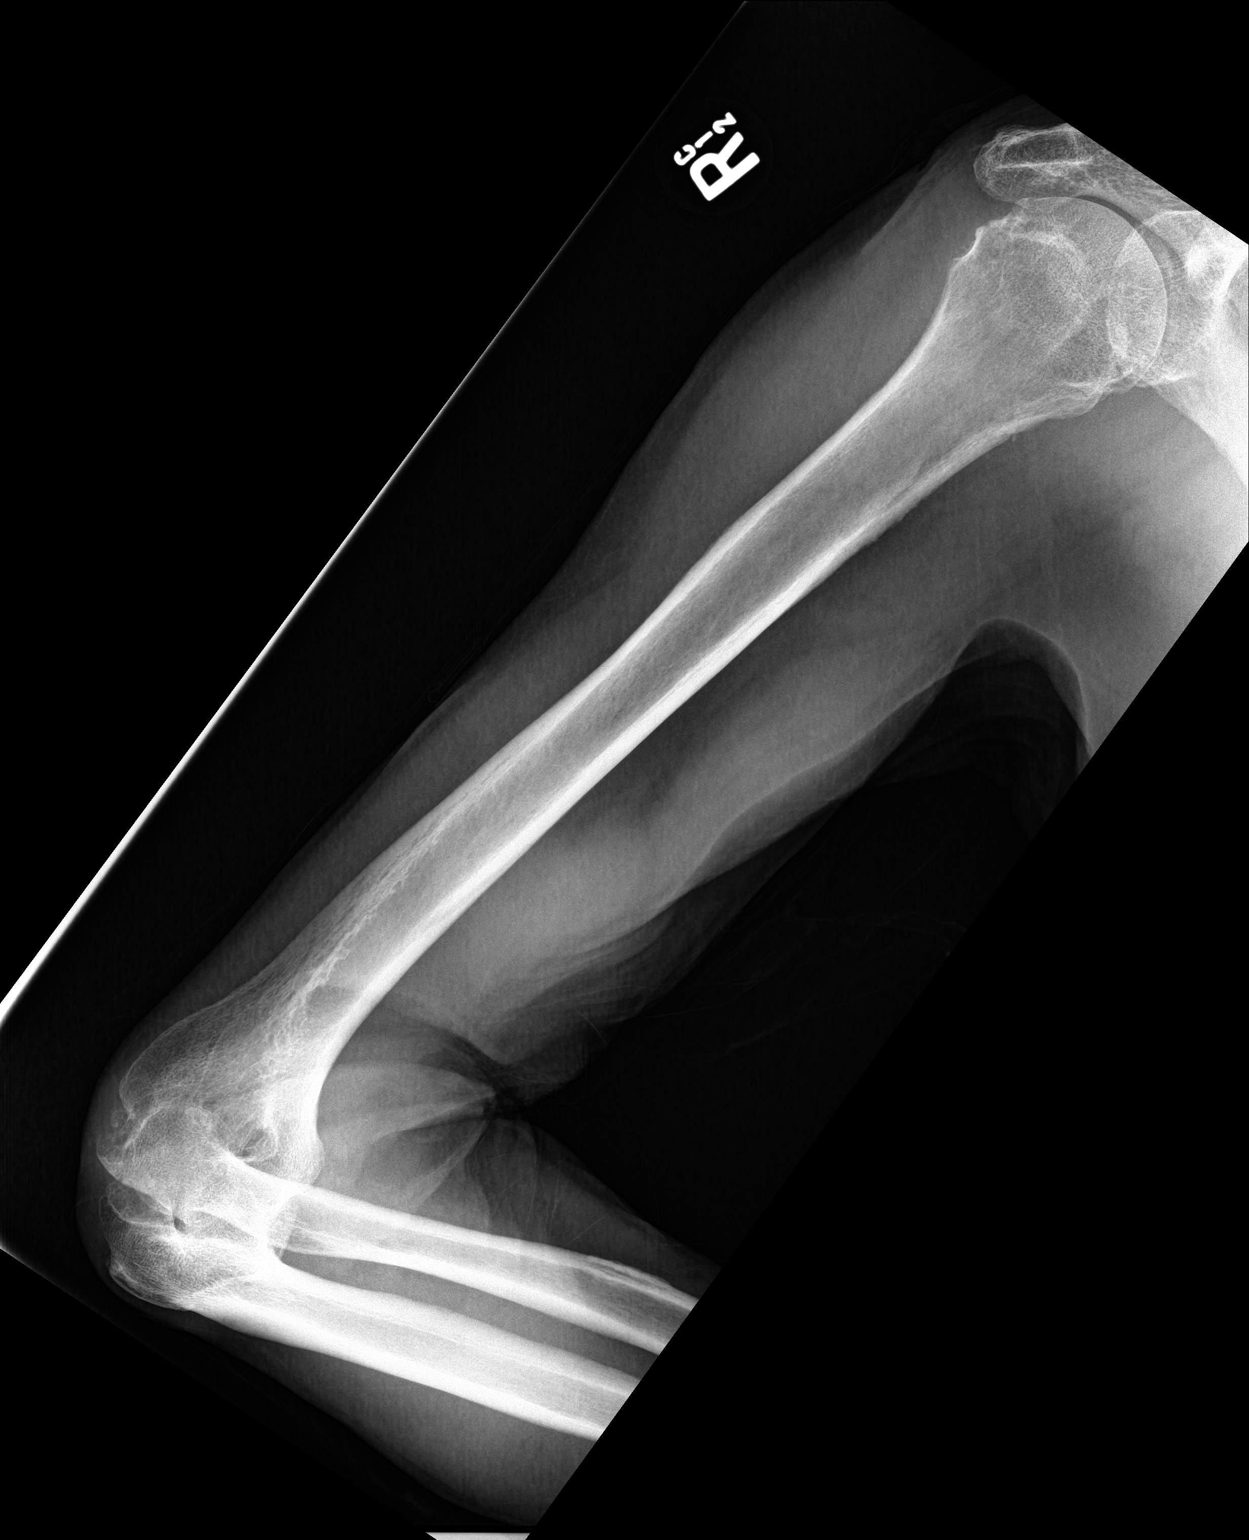

[3 of 3 positions shown; findings below may reference images not displayed]

FINDINGS: Right shoulder: Frontal, transscapular, and axillary views of the
right shoulder are obtained. No fracture, subluxation, or
dislocation. Moderate osteoarthritis of the glenohumeral and
acromioclavicular joints. Right chest is clear.

Right humerus: Frontal and lateral views demonstrate no fractures.
Moderate osteoarthritis of the right shoulder. Mild osteoarthritis
of the right elbow. The soft tissues are unremarkable.
IMPRESSION: 1. No acute fracture.
2. Moderate right shoulder osteoarthritis.
3. Mild right elbow osteoarthritis.

## 2020-06-27 DIAGNOSIS — E1165 Type 2 diabetes mellitus with hyperglycemia: Secondary | ICD-10-CM | POA: Diagnosis not present

## 2020-06-27 DIAGNOSIS — K219 Gastro-esophageal reflux disease without esophagitis: Secondary | ICD-10-CM | POA: Diagnosis not present

## 2020-07-28 DIAGNOSIS — E1165 Type 2 diabetes mellitus with hyperglycemia: Secondary | ICD-10-CM | POA: Diagnosis not present

## 2020-07-28 DIAGNOSIS — K219 Gastro-esophageal reflux disease without esophagitis: Secondary | ICD-10-CM | POA: Diagnosis not present

## 2020-08-17 DIAGNOSIS — Z23 Encounter for immunization: Secondary | ICD-10-CM | POA: Diagnosis not present

## 2020-08-27 DIAGNOSIS — E785 Hyperlipidemia, unspecified: Secondary | ICD-10-CM | POA: Diagnosis not present

## 2020-08-27 DIAGNOSIS — E1165 Type 2 diabetes mellitus with hyperglycemia: Secondary | ICD-10-CM | POA: Diagnosis not present

## 2020-09-27 DIAGNOSIS — E1165 Type 2 diabetes mellitus with hyperglycemia: Secondary | ICD-10-CM | POA: Diagnosis not present

## 2020-09-27 DIAGNOSIS — E785 Hyperlipidemia, unspecified: Secondary | ICD-10-CM | POA: Diagnosis not present

## 2020-10-28 DIAGNOSIS — E119 Type 2 diabetes mellitus without complications: Secondary | ICD-10-CM | POA: Diagnosis not present

## 2020-10-28 DIAGNOSIS — N400 Enlarged prostate without lower urinary tract symptoms: Secondary | ICD-10-CM | POA: Diagnosis not present

## 2020-10-28 DIAGNOSIS — Z1331 Encounter for screening for depression: Secondary | ICD-10-CM | POA: Diagnosis not present

## 2020-10-28 DIAGNOSIS — Z79899 Other long term (current) drug therapy: Secondary | ICD-10-CM | POA: Diagnosis not present

## 2020-10-28 DIAGNOSIS — I10 Essential (primary) hypertension: Secondary | ICD-10-CM | POA: Diagnosis not present

## 2020-10-28 DIAGNOSIS — M79603 Pain in arm, unspecified: Secondary | ICD-10-CM | POA: Diagnosis not present

## 2020-10-28 DIAGNOSIS — E785 Hyperlipidemia, unspecified: Secondary | ICD-10-CM | POA: Diagnosis not present

## 2020-10-28 DIAGNOSIS — Z0001 Encounter for general adult medical examination with abnormal findings: Secondary | ICD-10-CM | POA: Diagnosis not present

## 2020-10-28 DIAGNOSIS — E1165 Type 2 diabetes mellitus with hyperglycemia: Secondary | ICD-10-CM | POA: Diagnosis not present

## 2020-10-28 DIAGNOSIS — Z1389 Encounter for screening for other disorder: Secondary | ICD-10-CM | POA: Diagnosis not present

## 2020-10-28 DIAGNOSIS — M5414 Radiculopathy, thoracic region: Secondary | ICD-10-CM | POA: Diagnosis not present

## 2020-11-28 DIAGNOSIS — E1165 Type 2 diabetes mellitus with hyperglycemia: Secondary | ICD-10-CM | POA: Diagnosis not present

## 2020-11-28 DIAGNOSIS — K219 Gastro-esophageal reflux disease without esophagitis: Secondary | ICD-10-CM | POA: Diagnosis not present

## 2020-12-15 DIAGNOSIS — E119 Type 2 diabetes mellitus without complications: Secondary | ICD-10-CM | POA: Diagnosis not present

## 2021-01-02 DIAGNOSIS — E1165 Type 2 diabetes mellitus with hyperglycemia: Secondary | ICD-10-CM | POA: Diagnosis not present

## 2021-01-02 DIAGNOSIS — I10 Essential (primary) hypertension: Secondary | ICD-10-CM | POA: Diagnosis not present

## 2021-02-06 DIAGNOSIS — E1165 Type 2 diabetes mellitus with hyperglycemia: Secondary | ICD-10-CM | POA: Diagnosis not present

## 2021-02-06 DIAGNOSIS — E785 Hyperlipidemia, unspecified: Secondary | ICD-10-CM | POA: Diagnosis not present

## 2021-03-09 DIAGNOSIS — E1165 Type 2 diabetes mellitus with hyperglycemia: Secondary | ICD-10-CM | POA: Diagnosis not present

## 2021-03-09 DIAGNOSIS — K219 Gastro-esophageal reflux disease without esophagitis: Secondary | ICD-10-CM | POA: Diagnosis not present

## 2021-03-24 DIAGNOSIS — Z23 Encounter for immunization: Secondary | ICD-10-CM | POA: Diagnosis not present

## 2021-04-08 DIAGNOSIS — I10 Essential (primary) hypertension: Secondary | ICD-10-CM | POA: Diagnosis not present

## 2021-04-08 DIAGNOSIS — E1165 Type 2 diabetes mellitus with hyperglycemia: Secondary | ICD-10-CM | POA: Diagnosis not present

## 2021-04-21 DIAGNOSIS — N400 Enlarged prostate without lower urinary tract symptoms: Secondary | ICD-10-CM | POA: Diagnosis not present

## 2021-04-21 DIAGNOSIS — Z125 Encounter for screening for malignant neoplasm of prostate: Secondary | ICD-10-CM | POA: Diagnosis not present

## 2021-04-21 DIAGNOSIS — E785 Hyperlipidemia, unspecified: Secondary | ICD-10-CM | POA: Diagnosis not present

## 2021-04-21 DIAGNOSIS — Z79899 Other long term (current) drug therapy: Secondary | ICD-10-CM | POA: Diagnosis not present

## 2021-04-21 DIAGNOSIS — E1165 Type 2 diabetes mellitus with hyperglycemia: Secondary | ICD-10-CM | POA: Diagnosis not present

## 2021-04-21 DIAGNOSIS — K219 Gastro-esophageal reflux disease without esophagitis: Secondary | ICD-10-CM | POA: Diagnosis not present

## 2021-05-21 DIAGNOSIS — E1165 Type 2 diabetes mellitus with hyperglycemia: Secondary | ICD-10-CM | POA: Diagnosis not present

## 2021-05-21 DIAGNOSIS — K219 Gastro-esophageal reflux disease without esophagitis: Secondary | ICD-10-CM | POA: Diagnosis not present

## 2021-06-07 ENCOUNTER — Ambulatory Visit (HOSPITAL_COMMUNITY)
Admission: RE | Admit: 2021-06-07 | Discharge: 2021-06-07 | Disposition: A | Discharge status: Home / Self Care | Payer: Medicare Other | Source: Ambulatory Visit | Attending: Gerontology | Admitting: Gerontology

## 2021-06-07 ENCOUNTER — Other Ambulatory Visit (HOSPITAL_COMMUNITY): Payer: Self-pay | Admitting: Gerontology

## 2021-06-07 ENCOUNTER — Other Ambulatory Visit: Payer: Self-pay

## 2021-06-07 DIAGNOSIS — M25511 Pain in right shoulder: Secondary | ICD-10-CM | POA: Insufficient documentation

## 2021-06-07 DIAGNOSIS — K219 Gastro-esophageal reflux disease without esophagitis: Secondary | ICD-10-CM | POA: Diagnosis not present

## 2021-06-07 DIAGNOSIS — M1990 Unspecified osteoarthritis, unspecified site: Secondary | ICD-10-CM | POA: Diagnosis not present

## 2021-06-07 DIAGNOSIS — E1165 Type 2 diabetes mellitus with hyperglycemia: Secondary | ICD-10-CM | POA: Diagnosis not present

## 2021-07-08 DIAGNOSIS — I10 Essential (primary) hypertension: Secondary | ICD-10-CM | POA: Diagnosis not present

## 2021-07-08 DIAGNOSIS — E1165 Type 2 diabetes mellitus with hyperglycemia: Secondary | ICD-10-CM | POA: Diagnosis not present

## 2021-08-08 DIAGNOSIS — N400 Enlarged prostate without lower urinary tract symptoms: Secondary | ICD-10-CM | POA: Diagnosis not present

## 2021-08-08 DIAGNOSIS — E1165 Type 2 diabetes mellitus with hyperglycemia: Secondary | ICD-10-CM | POA: Diagnosis not present

## 2021-08-30 DIAGNOSIS — Z23 Encounter for immunization: Secondary | ICD-10-CM | POA: Diagnosis not present

## 2021-09-04 IMAGING — DX DG SHOULDER 2+V*R*
2 series · 2 of 2 positions shown · non-contrast
Comparison: 02/26/2020

CLINICAL DATA: Right shoulder pain

EXAM:
RIGHT SHOULDER - 2+ VIEW

[shoulder grashey]
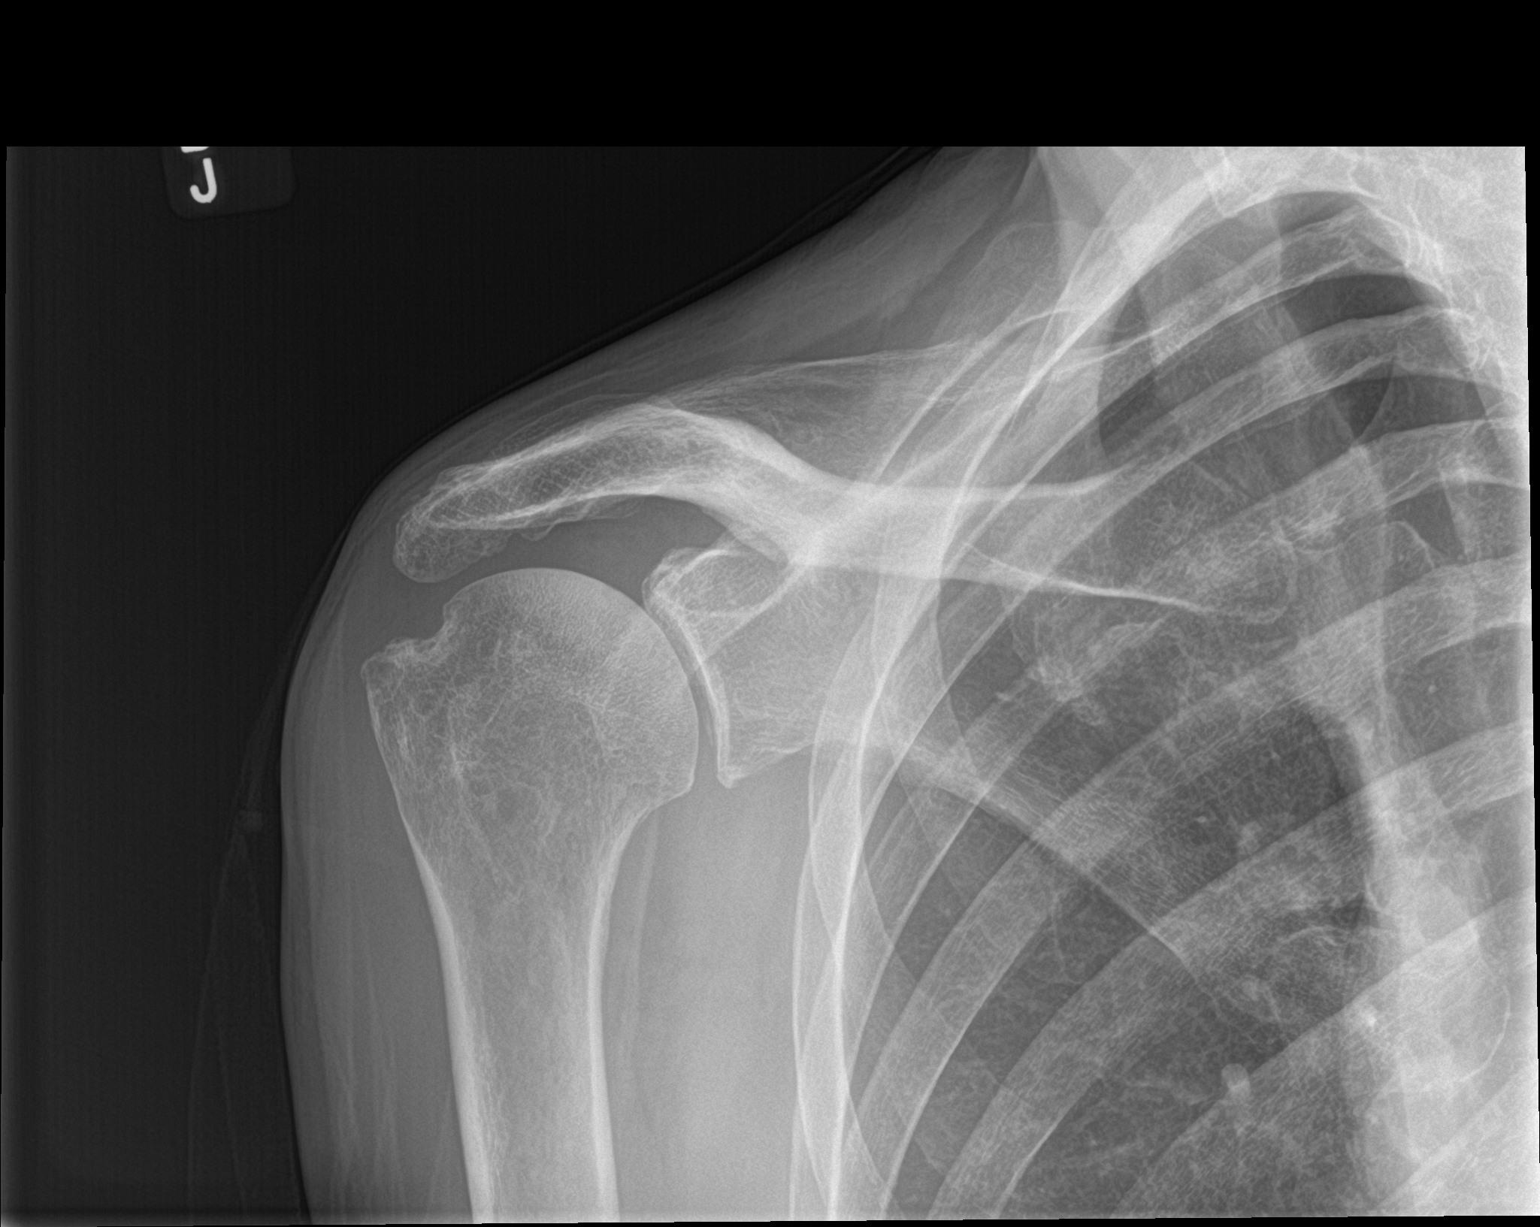

[shoulder y view]
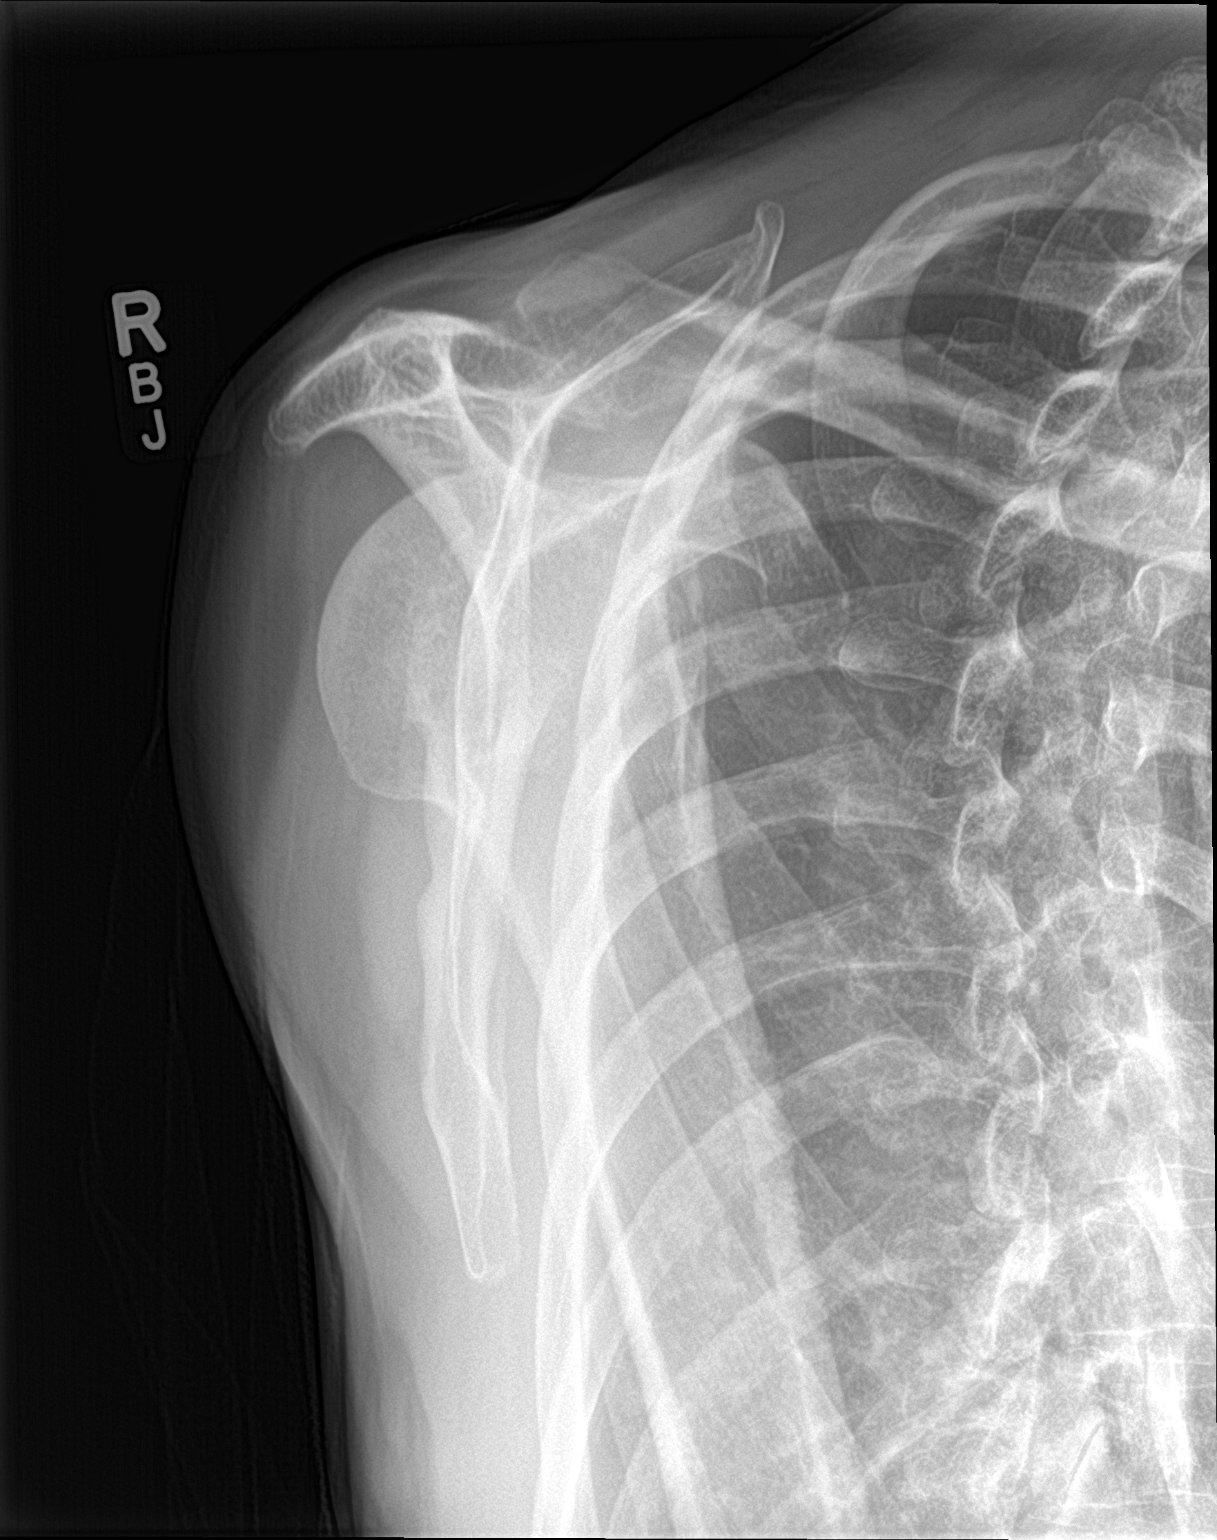

[2 of 2 positions shown; findings below may reference images not displayed]

FINDINGS: Normal alignment. Negative for acute fracture. Chronic Hill-Sachs
deformity.
IMPRESSION: Negative for fracture.  Hill-Sachs deformity proximal humerus.

## 2021-09-07 DIAGNOSIS — E1165 Type 2 diabetes mellitus with hyperglycemia: Secondary | ICD-10-CM | POA: Diagnosis not present

## 2021-09-07 DIAGNOSIS — I10 Essential (primary) hypertension: Secondary | ICD-10-CM | POA: Diagnosis not present

## 2021-09-29 DIAGNOSIS — Z23 Encounter for immunization: Secondary | ICD-10-CM | POA: Diagnosis not present

## 2021-10-08 DIAGNOSIS — E1165 Type 2 diabetes mellitus with hyperglycemia: Secondary | ICD-10-CM | POA: Diagnosis not present

## 2021-10-08 DIAGNOSIS — I10 Essential (primary) hypertension: Secondary | ICD-10-CM | POA: Diagnosis not present

## 2021-10-20 DIAGNOSIS — E1142 Type 2 diabetes mellitus with diabetic polyneuropathy: Secondary | ICD-10-CM | POA: Diagnosis not present

## 2021-10-20 DIAGNOSIS — Z1331 Encounter for screening for depression: Secondary | ICD-10-CM | POA: Diagnosis not present

## 2021-10-20 DIAGNOSIS — R7989 Other specified abnormal findings of blood chemistry: Secondary | ICD-10-CM | POA: Diagnosis not present

## 2021-10-20 DIAGNOSIS — Z0001 Encounter for general adult medical examination with abnormal findings: Secondary | ICD-10-CM | POA: Diagnosis not present

## 2021-10-20 DIAGNOSIS — E785 Hyperlipidemia, unspecified: Secondary | ICD-10-CM | POA: Diagnosis not present

## 2021-10-20 DIAGNOSIS — Z1389 Encounter for screening for other disorder: Secondary | ICD-10-CM | POA: Diagnosis not present

## 2021-10-20 DIAGNOSIS — E1165 Type 2 diabetes mellitus with hyperglycemia: Secondary | ICD-10-CM | POA: Diagnosis not present

## 2021-11-20 DIAGNOSIS — E1165 Type 2 diabetes mellitus with hyperglycemia: Secondary | ICD-10-CM | POA: Diagnosis not present

## 2021-11-20 DIAGNOSIS — K219 Gastro-esophageal reflux disease without esophagitis: Secondary | ICD-10-CM | POA: Diagnosis not present

## 2021-12-21 DIAGNOSIS — E1165 Type 2 diabetes mellitus with hyperglycemia: Secondary | ICD-10-CM | POA: Diagnosis not present

## 2021-12-21 DIAGNOSIS — N400 Enlarged prostate without lower urinary tract symptoms: Secondary | ICD-10-CM | POA: Diagnosis not present

## 2022-01-18 DIAGNOSIS — E785 Hyperlipidemia, unspecified: Secondary | ICD-10-CM | POA: Diagnosis not present

## 2022-01-18 DIAGNOSIS — E1165 Type 2 diabetes mellitus with hyperglycemia: Secondary | ICD-10-CM | POA: Diagnosis not present

## 2022-02-18 DIAGNOSIS — E1165 Type 2 diabetes mellitus with hyperglycemia: Secondary | ICD-10-CM | POA: Diagnosis not present

## 2022-02-18 DIAGNOSIS — I10 Essential (primary) hypertension: Secondary | ICD-10-CM | POA: Diagnosis not present

## 2022-03-20 DIAGNOSIS — E1165 Type 2 diabetes mellitus with hyperglycemia: Secondary | ICD-10-CM | POA: Diagnosis not present

## 2022-03-20 DIAGNOSIS — N400 Enlarged prostate without lower urinary tract symptoms: Secondary | ICD-10-CM | POA: Diagnosis not present

## 2022-04-12 DIAGNOSIS — K219 Gastro-esophageal reflux disease without esophagitis: Secondary | ICD-10-CM | POA: Diagnosis not present

## 2022-04-12 DIAGNOSIS — E1165 Type 2 diabetes mellitus with hyperglycemia: Secondary | ICD-10-CM | POA: Diagnosis not present

## 2022-04-12 DIAGNOSIS — D508 Other iron deficiency anemias: Secondary | ICD-10-CM | POA: Diagnosis not present

## 2022-04-12 DIAGNOSIS — N400 Enlarged prostate without lower urinary tract symptoms: Secondary | ICD-10-CM | POA: Diagnosis not present

## 2022-04-30 ENCOUNTER — Other Ambulatory Visit (HOSPITAL_COMMUNITY): Payer: Self-pay | Admitting: Specialist

## 2022-04-30 DIAGNOSIS — R1312 Dysphagia, oropharyngeal phase: Secondary | ICD-10-CM

## 2022-04-30 DIAGNOSIS — R633 Feeding difficulties, unspecified: Secondary | ICD-10-CM

## 2022-05-12 DIAGNOSIS — E1165 Type 2 diabetes mellitus with hyperglycemia: Secondary | ICD-10-CM | POA: Diagnosis not present

## 2022-05-12 DIAGNOSIS — I10 Essential (primary) hypertension: Secondary | ICD-10-CM | POA: Diagnosis not present

## 2022-05-24 ENCOUNTER — Ambulatory Visit (HOSPITAL_COMMUNITY)
Admission: RE | Admit: 2022-05-24 | Discharge: 2022-05-24 | Disposition: A | Discharge status: Home / Self Care | Payer: Medicare Other | Source: Ambulatory Visit | Attending: Gerontology | Admitting: Gerontology

## 2022-05-24 ENCOUNTER — Encounter (HOSPITAL_COMMUNITY): Payer: Self-pay | Admitting: Speech Pathology

## 2022-05-24 ENCOUNTER — Ambulatory Visit (HOSPITAL_COMMUNITY): Payer: Medicare Other | Admitting: Speech Pathology

## 2022-05-24 DIAGNOSIS — R1312 Dysphagia, oropharyngeal phase: Secondary | ICD-10-CM | POA: Insufficient documentation

## 2022-05-24 DIAGNOSIS — R633 Feeding difficulties, unspecified: Secondary | ICD-10-CM | POA: Insufficient documentation

## 2022-05-24 DIAGNOSIS — R1310 Dysphagia, unspecified: Secondary | ICD-10-CM | POA: Diagnosis not present

## 2022-05-24 NOTE — Therapy (Signed)
[place], [place], [address] Phone: [phone]   Fax:  [phone]  Modified Barium Swallow  Patient Details  Name: Jose Chase MRN: [id number] Date of Birth: [birth year] No data recorded  Encounter Date: 05/24/2022   End of Session - 05/24/22 1421     Visit Number 1    Number of Visits 1    Authorization Type Medicare    SLP Start Time 1140    SLP Stop Time  1211    SLP Time Calculation (min) 31 min    Activity Tolerance Patient tolerated treatment well             Past Medical History:  Diagnosis Date   Aspirin long-term use    DM (diabetes mellitus) ([place])    Hemorrhoids, internal Dec 2008   Hiatal hernia DEC 2008 LARGE   Prostate cancer [facility])     Past Surgical History:  Procedure Laterality Date   BACK SURGERY     COLONOSCOPY  DEC 5643   COMPLICATED BY CONTAINED RECTAL PERFORATION   ESOPHAGOGASTRODUODENOSCOPY  05/29/2011   Procedure: ESOPHAGOGASTRODUODENOSCOPY (EGD);  Surgeon: Dorothyann Peng, MD;  [facility];  Service: Endoscopy;  Laterality: N/A;  Possible ED   ESOPHAGOGASTRODUODENOSCOPY N/A 02/07/2016   Procedure: ESOPHAGOGASTRODUODENOSCOPY (EGD);  Surgeon: Danie Binder, MD;  [facility];  Service: Endoscopy;  Laterality: N/A;  200 - moved to 3/28 @ 10:30 - office notified pt   ESOPHAGOGASTRODUODENOSCOPY (EGD) WITH ESOPHAGEAL DILATION N/A 10/20/2013   PIR:[id number] at the gastroesophageal junction/ Medium sized hiatal hernia/MILD Erosive gastritis. Biopsy showed chronic inflammation, no H. pylori, fragment of fundic gland polyp   MALONEY DILATION  05/29/2011   Procedure: MALONEY DILATION;  Surgeon: Dorothyann Peng, MD;  [facility];  Service: Endoscopy;  Laterality: N/A;   PROSTATE SURGERY     prostate cancer   SAVORY DILATION  05/29/2011   Procedure: SAVORY DILATION;  Surgeon: Dorothyann Peng, MD;  [facility];  Service: Endoscopy;  Laterality:  N/A;   SAVORY DILATION N/A 02/07/2016   Procedure: SAVORY DILATION;  Surgeon: Danie Binder, MD;  [facility];  Service: Endoscopy;  Laterality: N/A;   UPPER GASTROINTESTINAL ENDOSCOPY  DEC 2008   YT:[id number] GASTRIC MUCOSA    There were no vitals filed for this visit.        General - 05/24/22 1404       General Information   Date of Onset 04/12/22    HPI Jose Chase is an 88 yo male who was referred by Wilfred Curtis, NP for MBSS due to Pt with reports of dysphagia. Pt tells SLP that he quit singing in the choir because he would have food come back up into his mouth. He has lost a lot of weight, feels dizzy frequently, and has had falls due to dizziness. He was in the ER last week for dizziness and dehydration. Pt had an EGD 02/07/2016 with dilation.    Type of Study MBS-Modified Barium Swallow Study    Previous Swallow Assessment EGD 02/07/2016    Diet Prior to this Study Regular;Thin liquids    Temperature Spikes Noted No    Respiratory Status Room air    History of Recent Intubation No    Behavior/Cognition Alert;Cooperative;Pleasant mood    Oral Cavity Assessment Within Functional Limits    Oral Care Completed by SLP No    Oral Cavity - Dentition Dentures, top;Dentures, bottom  Vision Functional for self feeding    Self-Feeding Abilities Able to feed self    Patient Positioning Upright in chair    Baseline Vocal Quality Normal    Volitional Cough Strong    Volitional Swallow Able to elicit    Anatomy Within functional limits    Pharyngeal Secretions Not observed secondary MBS                Oral Preparation/Oral Phase - 05/24/22 1414       Oral Preparation/Oral Phase   Oral Phase Within functional limits      Electrical stimulation - Oral Phase   Was Electrical Stimulation Used No              Pharyngeal Phase - 05/24/22 1414       Pharyngeal Phase   Pharyngeal Phase Impaired      Pharyngeal - Thin   Pharyngeal- Thin Teaspoon  Penetration/Aspiration before swallow;Pharyngeal residue - valleculae    Pharyngeal Material enters airway, remains ABOVE vocal cords then ejected out    Pharyngeal- Thin Cup Swallow initiation at vallecula;Penetration/Aspiration during swallow;Pharyngeal residue - valleculae    Pharyngeal Material enters airway, remains ABOVE vocal cords then ejected out    Pharyngeal- Thin Straw Swallow initiation at pyriform sinus;Pharyngeal residue - valleculae      Pharyngeal - Solids   Pharyngeal- Puree Within functional limits;Pharyngeal residue - valleculae    Pharyngeal- Regular Within functional limits    Pharyngeal- Pill Penetration/Aspiration during swallow;Pharyngeal residue - valleculae    Pharyngeal Material enters airway, remains ABOVE vocal cords then ejected out      Pharyngeal Phase - Comment   Pharyngeal Comment min pooling in the valleculae after the swallow which Pt independently clears with spontaneous repeat/dry swallow      Electrical Stimulation - Pharyngeal Phase   Was Electrical Stimulation Used No              Cricopharyngeal Phase - 05/24/22 1417       Cervical Esophageal Phase   Cervical Esophageal Phase Impaired      Cervical Esophageal Phase - Comment   Other Esophageal Phase Observations Esophageal sweep reveals standing column of barium with narrowing  near the GE junction only allowing trace amounts of barium to pass through. Please see radiologist's note               Plan - 05/24/22 1421     Clinical Impression Statement Pt presents with mild oropharyngeal dysphagia in setting of U/L dentures characterized by min lingual residuals post swallow with liquids (likey due to collecting near dentures) and pooling in valleculae after the primary swallow, however Pt independently clears with a spontaneous second swallow. Swallow trigger is generally at the valleculae (pyriforms with straw sips) and Pt with variable trace, flash penetration of thins before and  during the swallow without aspiration. Suspect primary esophageal dysphagia as esophageal sweep revealed standing column of barium in a dilated esophagus and narrowing near the GE junction with only trace amounts of liquids passing through. Pt's reports of regurgitation are in line with esophageal observations today. Pt was in the ER last week for dizziness and dehydration and he reports signifcant weight loss. Strongly recommend STAT GI consult for probable EGD. Pt is at high risk for malnutrition/dehydration and regurgitation. This study was reviewed with Pt and recommendations presented to Pt and wife with recommendation for full liquids and GI consult as soon as possible.    Consulted and Agree with Plan of Care  Patient;Family member/caregiver             Patient will benefit from skilled therapeutic intervention in order to improve the following deficits and impairments:   Oropharyngeal dysphagia     Recommendations/Treatment - 05/24/22 1418       Swallow Evaluation Recommendations   Recommended Consults Consider GI evaluation    SLP Diet Recommendations --   full liquids until seen by GI   Liquid Administration via Cup;Straw    Medication Administration Whole meds with liquid    Supervision Patient able to self feed    Compensations Multiple dry swallows after each bite/sip    Postural Changes Seated upright at 90 degrees;Remain upright for at least 30 minutes after feeds/meals              Prognosis - 05/24/22 1419       Prognosis   Prognosis for Safe Diet Advancement Guarded    Barriers to Reach Goals Other (Comment)    Barriers/Prognosis Comment Pt appears to have significant narrowing in the distal esophagus and will need GI consult asap (weight loss, dehydration, dizziness, falls)      Individuals Consulted   Consulted and Agree with Results and Recommendations Patient;Family member/caregiver    Family Member Consulted wife    Report Sent to  Referring physician              Problem List Patient Active Problem List   Diagnosis Date Noted   Loss of weight 01/12/2016   Gastritis and gastroduodenitis 02/23/2014   Dysphagia 05/21/2011   Thank you,  Genene Churn, [place]  Genene Churn, [place] 05/24/2022, 2:31 PM  [facility] [address] [place], [place], [address] Phone: [phone]   Fax:  [phone]  Name: Jose Chase MRN: [id number] Date of Birth: 15-Jul-1932

## 2022-06-05 ENCOUNTER — Encounter: Payer: Self-pay | Admitting: Internal Medicine

## 2022-06-12 DIAGNOSIS — I10 Essential (primary) hypertension: Secondary | ICD-10-CM | POA: Diagnosis not present

## 2022-06-12 DIAGNOSIS — E1165 Type 2 diabetes mellitus with hyperglycemia: Secondary | ICD-10-CM | POA: Diagnosis not present

## 2022-07-12 ENCOUNTER — Encounter: Payer: Self-pay | Admitting: Internal Medicine

## 2022-07-12 ENCOUNTER — Telehealth: Payer: Self-pay | Admitting: *Deleted

## 2022-07-12 ENCOUNTER — Ambulatory Visit (INDEPENDENT_AMBULATORY_CARE_PROVIDER_SITE_OTHER): Payer: Medicare Other | Admitting: Internal Medicine

## 2022-07-12 ENCOUNTER — Encounter: Payer: Self-pay | Admitting: *Deleted

## 2022-07-12 VITALS — BP 176/87 | HR 62 | Temp 97.5°F | Ht 72.0 in | Wt 152.4 lb

## 2022-07-12 DIAGNOSIS — R1319 Other dysphagia: Secondary | ICD-10-CM | POA: Diagnosis not present

## 2022-07-12 DIAGNOSIS — K219 Gastro-esophageal reflux disease without esophagitis: Secondary | ICD-10-CM

## 2022-07-12 DIAGNOSIS — I442 Atrioventricular block, complete: Secondary | ICD-10-CM | POA: Diagnosis not present

## 2022-07-12 DIAGNOSIS — K222 Esophageal obstruction: Secondary | ICD-10-CM | POA: Diagnosis not present

## 2022-07-12 DIAGNOSIS — R42 Dizziness and giddiness: Secondary | ICD-10-CM | POA: Diagnosis not present

## 2022-07-12 DIAGNOSIS — R933 Abnormal findings on diagnostic imaging of other parts of digestive tract: Secondary | ICD-10-CM

## 2022-07-12 DIAGNOSIS — R634 Abnormal weight loss: Secondary | ICD-10-CM | POA: Diagnosis not present

## 2022-07-12 NOTE — Progress Notes (Signed)
Primary Care Physician:  Carrolyn Meiers, MD Primary Gastroenterologist:  Dr. Abbey Chatters  Chief Complaint  Patient presents with   Dysphagia    States food will come back up in his throat as well at times.     HPI:   Jose Chase is a 88 y.o. male who presents to clinic today to discuss dysphagia.  Has not been seen in our clinic since 2017.  History of dysphagia in the past status post dilation by EGD 02/07/2016.  Previously on omeprazole though currently not take anything for acid reflux.  Also notes chronic heartburn and acid reflux.  In regards to his dysphagia this is progressively worsened.  Notes food and pills getting stuck in his substernal region.  Often regurgitates food.  Also notes associated weight loss.  Underwent MBSS 05/24/2022 with some oral pharyngeal dysfunction but more notably a distal esophageal stricture at the level of the GEJ.  Recommended stat GI consult with EGD.  No melena hematochezia.  Past Medical History:  Diagnosis Date   Aspirin long-term use    DM (diabetes mellitus) ([place])    Hemorrhoids, internal Dec 2008   Hiatal hernia DEC 2008 LARGE   Prostate cancer [facility])     Past Surgical History:  Procedure Laterality Date   BACK SURGERY     COLONOSCOPY  DEC 2248   COMPLICATED BY CONTAINED RECTAL PERFORATION   ESOPHAGOGASTRODUODENOSCOPY  05/29/2011   Procedure: ESOPHAGOGASTRODUODENOSCOPY (EGD);  Surgeon: Dorothyann Peng, MD;  [facility];  Service: Endoscopy;  Laterality: N/A;  Possible ED   ESOPHAGOGASTRODUODENOSCOPY N/A 02/07/2016   Procedure: ESOPHAGOGASTRODUODENOSCOPY (EGD);  Surgeon: Danie Binder, MD;  [facility];  Service: Endoscopy;  Laterality: N/A;  200 - moved to 3/28 @ 10:30 - office notified pt   ESOPHAGOGASTRODUODENOSCOPY (EGD) WITH ESOPHAGEAL DILATION N/A 10/20/2013   GNO:[id number] at the gastroesophageal junction/ Medium sized hiatal hernia/MILD Erosive gastritis. Biopsy showed chronic inflammation, no H.  pylori, fragment of fundic gland polyp   MALONEY DILATION  05/29/2011   Procedure: MALONEY DILATION;  Surgeon: Dorothyann Peng, MD;  [facility];  Service: Endoscopy;  Laterality: N/A;   PROSTATE SURGERY     prostate cancer   SAVORY DILATION  05/29/2011   Procedure: SAVORY DILATION;  Surgeon: Dorothyann Peng, MD;  [facility];  Service: Endoscopy;  Laterality: N/A;   SAVORY DILATION N/A 02/07/2016   Procedure: SAVORY DILATION;  Surgeon: Danie Binder, MD;  [facility];  Service: Endoscopy;  Laterality: N/A;   UPPER GASTROINTESTINAL ENDOSCOPY  DEC 2008   VQ:XIHWTU GASTRIC MUCOSA    Current Outpatient Medications  Medication Sig Dispense Refill   ALLERGY RELIEF 10 MG tablet Take 10 mg by mouth daily.     ferrous sulfate 325 (65 FE) MG tablet Take 325 mg by mouth daily with breakfast.     gabapentin (NEURONTIN) 100 MG capsule Take 100 mg by mouth at bedtime.     glipiZIDE (GLUCOTROL) 10 MG tablet Take 10 mg by mouth daily before breakfast.     metFORMIN (GLUCOPHAGE) 500 MG tablet Take 1,000 mg by mouth 2 (two) times daily with a meal.      Multiple Vitamins-Minerals (MEGA MULTI MEN PO) Take by mouth. Takes one tablet daily     omeprazole (PRILOSEC) 20 MG capsule 1 PO 30 MINS PRIOR TO BREAKFAST and supper 60 capsule 11   simvastatin (ZOCOR) 20 MG tablet Take 20 mg by mouth at  bedtime.       No current facility-administered medications for this visit.    Allergies as of 07/12/2022   (No Known Allergies)    Family History  Problem Relation Age of Onset   Heart disease Father    Heart disease Brother    Cancer Brother    Cancer Brother    Cancer Other    Cancer Sister    Colon cancer Neg Hx    Colon polyps Neg Hx     Social History   Socioeconomic History   Marital status: Married    Spouse name: Not on file   Number of children: Not on file   Years of education: Not on file   Highest education level: Not on file  Occupational History   Not  on file  Tobacco Use   Smoking status: Former    Types: Cigarettes    Quit date: 01/11/1973    Years since quitting: 49.5   Smokeless tobacco: Never   Tobacco comments:    Never smoked very much. Occasional smoker-once or twice a week. Quit in 1974.  Substance and Sexual Activity   Alcohol use: No    Alcohol/week: 0.0 standard drinks of alcohol   Drug use: No   Sexual activity: Not Currently  Other Topics Concern   Not on file  Social History Narrative   Not on file   Social Determinants of Health   Financial Resource Strain: Not on file  Food Insecurity: Not on file  Transportation Needs: Not on file  Physical Activity: Not on file  Stress: Not on file  Social Connections: Not on file  Intimate Partner Violence: Not on file    Subjective: Review of Systems  Constitutional:  Negative for chills and fever.  HENT:  Negative for congestion and hearing loss.   Eyes:  Negative for blurred vision and double vision.  Respiratory:  Negative for cough and shortness of breath.   Cardiovascular:  Negative for chest pain and palpitations.  Gastrointestinal:  Positive for heartburn. Negative for abdominal pain, blood in stool, constipation, diarrhea, melena and vomiting.       Dysphagia  Genitourinary:  Negative for dysuria and urgency.  Musculoskeletal:  Negative for joint pain and myalgias.  Skin:  Negative for itching and rash.  Neurological:  Negative for dizziness and headaches.  Psychiatric/Behavioral:  Negative for depression. The patient is not nervous/anxious.        Objective: BP (!) 176/87 (BP Location: Left Arm, Patient Position: Sitting, Cuff Size: Normal)   Pulse 62   Temp (!) 97.5 F (36.4 C) (Temporal)   Ht 6' (1.829 m)   Wt 152 lb 6.4 oz (69.1 kg)   SpO2 100%   BMI 20.67 kg/m  Physical Exam Constitutional:      Appearance: Normal appearance.  HENT:     Head: Normocephalic and atraumatic.  Eyes:     Extraocular Movements: Extraocular movements intact.      Conjunctiva/sclera: Conjunctivae normal.  Cardiovascular:     Rate and Rhythm: Normal rate and regular rhythm.  Pulmonary:     Effort: Pulmonary effort is normal.     Breath sounds: Normal breath sounds.  Abdominal:     General: Bowel sounds are normal.     Palpations: Abdomen is soft.  Musculoskeletal:        General: Normal range of motion.     Cervical back: Normal range of motion and neck supple.  Skin:    General: Skin is warm.  Neurological:     General: No focal deficit present.     Mental Status: He is alert and oriented to person, place, and time.  Psychiatric:        Mood and Affect: Mood normal.        Behavior: Behavior normal.      Assessment: *Esophageal dysphagia *Chronic GERD *Esophageal stricture *Abnormal barium swallow study *Weight loss  Plan: Will schedule for urgent EGD with possible dilation to evaluate for peptic ulcer disease, esophagitis, gastritis, H. Pylori, duodenitis, or other. Will also evaluate for esophageal stricture, Schatzki's ring, esophageal web or other.   The risks including infection, bleed, or perforation as well as benefits, limitations, alternatives and imponderables have been reviewed with the patient. Potential for esophageal dilation, biopsy, etc. have also been reviewed.  Questions have been answered. All parties agreeable.  I will start patient on omeprazole 40 mg daily to help with any acid reflux which may be contributing.  Patient to avoid tough textures. All meats should be chopped finely. Eat slowly, take small bites, chew thoroughly, and drink plenty of liquids throughout meals. Advised if something were to get hung in her esophagus and not come up or go down, she should proceed to the emergency room.  Further recommendations to follow.  07/12/2022 11:06 AM   Disclaimer: This note was dictated with voice recognition software. Similar sounding words can inadvertently be transcribed and may not be corrected upon  review.

## 2022-07-12 NOTE — Telephone Encounter (Signed)
Pt son aware of pre-op appt.

## 2022-07-12 NOTE — Telephone Encounter (Signed)
LMOVM to call back for son to give pre-op appt details

## 2022-07-12 NOTE — H&P (View-Only) (Signed)
Primary Care Physician:  Carrolyn Meiers, MD Primary Gastroenterologist:  Dr. Abbey Chatters  Chief Complaint  Patient presents with   Dysphagia    States food will come back up in his throat as well at times.     HPI:   Jose Chase is a 88 y.o. male who presents to clinic today to discuss dysphagia.  Has not been seen in our clinic since 2017.  History of dysphagia in the past status post dilation by EGD 02/07/2016.  Previously on omeprazole though currently not take anything for acid reflux.  Also notes chronic heartburn and acid reflux.  In regards to his dysphagia this is progressively worsened.  Notes food and pills getting stuck in his substernal region.  Often regurgitates food.  Also notes associated weight loss.  Underwent MBSS 05/24/2022 with some oral pharyngeal dysfunction but more notably a distal esophageal stricture at the level of the GEJ.  Recommended stat GI consult with EGD.  No melena hematochezia.  Past Medical History:  Diagnosis Date   Aspirin long-term use    DM (diabetes mellitus) ([place])    Hemorrhoids, internal Dec 2008   Hiatal hernia DEC 2008 LARGE   Prostate cancer [facility])     Past Surgical History:  Procedure Laterality Date   BACK SURGERY     COLONOSCOPY  DEC 1610   COMPLICATED BY CONTAINED RECTAL PERFORATION   ESOPHAGOGASTRODUODENOSCOPY  05/29/2011   Procedure: ESOPHAGOGASTRODUODENOSCOPY (EGD);  Surgeon: Dorothyann Peng, MD;  [facility];  Service: Endoscopy;  Laterality: N/A;  Possible ED   ESOPHAGOGASTRODUODENOSCOPY N/A 02/07/2016   Procedure: ESOPHAGOGASTRODUODENOSCOPY (EGD);  Surgeon: Danie Binder, MD;  [facility];  Service: Endoscopy;  Laterality: N/A;  200 - moved to 3/28 @ 10:30 - office notified pt   ESOPHAGOGASTRODUODENOSCOPY (EGD) WITH ESOPHAGEAL DILATION N/A 10/20/2013   RUE:[id number] at the gastroesophageal junction/ Medium sized hiatal hernia/MILD Erosive gastritis. Biopsy showed chronic inflammation, no H.  pylori, fragment of fundic gland polyp   MALONEY DILATION  05/29/2011   Procedure: MALONEY DILATION;  Surgeon: Dorothyann Peng, MD;  [facility];  Service: Endoscopy;  Laterality: N/A;   PROSTATE SURGERY     prostate cancer   SAVORY DILATION  05/29/2011   Procedure: SAVORY DILATION;  Surgeon: Dorothyann Peng, MD;  [facility];  Service: Endoscopy;  Laterality: N/A;   SAVORY DILATION N/A 02/07/2016   Procedure: SAVORY DILATION;  Surgeon: Danie Binder, MD;  [facility];  Service: Endoscopy;  Laterality: N/A;   UPPER GASTROINTESTINAL ENDOSCOPY  DEC 2008   JY:NWGNFA GASTRIC MUCOSA    Current Outpatient Medications  Medication Sig Dispense Refill   ALLERGY RELIEF 10 MG tablet Take 10 mg by mouth daily.     ferrous sulfate 325 (65 FE) MG tablet Take 325 mg by mouth daily with breakfast.     gabapentin (NEURONTIN) 100 MG capsule Take 100 mg by mouth at bedtime.     glipiZIDE (GLUCOTROL) 10 MG tablet Take 10 mg by mouth daily before breakfast.     metFORMIN (GLUCOPHAGE) 500 MG tablet Take 1,000 mg by mouth 2 (two) times daily with a meal.      Multiple Vitamins-Minerals (MEGA MULTI MEN PO) Take by mouth. Takes one tablet daily     omeprazole (PRILOSEC) 20 MG capsule 1 PO 30 MINS PRIOR TO BREAKFAST and supper 60 capsule 11   simvastatin (ZOCOR) 20 MG tablet Take 20 mg by mouth at  bedtime.       No current facility-administered medications for this visit.    Allergies as of 07/12/2022   (No Known Allergies)    Family History  Problem Relation Age of Onset   Heart disease Father    Heart disease Brother    Cancer Brother    Cancer Brother    Cancer Other    Cancer Sister    Colon cancer Neg Hx    Colon polyps Neg Hx     Social History   Socioeconomic History   Marital status: Married    Spouse name: Not on file   Number of children: Not on file   Years of education: Not on file   Highest education level: Not on file  Occupational History   Not  on file  Tobacco Use   Smoking status: Former    Types: Cigarettes    Quit date: 01/11/1973    Years since quitting: 49.5   Smokeless tobacco: Never   Tobacco comments:    Never smoked very much. Occasional smoker-once or twice a week. Quit in 1974.  Substance and Sexual Activity   Alcohol use: No    Alcohol/week: 0.0 standard drinks of alcohol   Drug use: No   Sexual activity: Not Currently  Other Topics Concern   Not on file  Social History Narrative   Not on file   Social Determinants of Health   Financial Resource Strain: Not on file  Food Insecurity: Not on file  Transportation Needs: Not on file  Physical Activity: Not on file  Stress: Not on file  Social Connections: Not on file  Intimate Partner Violence: Not on file    Subjective: Review of Systems  Constitutional:  Negative for chills and fever.  HENT:  Negative for congestion and hearing loss.   Eyes:  Negative for blurred vision and double vision.  Respiratory:  Negative for cough and shortness of breath.   Cardiovascular:  Negative for chest pain and palpitations.  Gastrointestinal:  Positive for heartburn. Negative for abdominal pain, blood in stool, constipation, diarrhea, melena and vomiting.       Dysphagia  Genitourinary:  Negative for dysuria and urgency.  Musculoskeletal:  Negative for joint pain and myalgias.  Skin:  Negative for itching and rash.  Neurological:  Negative for dizziness and headaches.  Psychiatric/Behavioral:  Negative for depression. The patient is not nervous/anxious.        Objective: BP (!) 176/87 (BP Location: Left Arm, Patient Position: Sitting, Cuff Size: Normal)   Pulse 62   Temp (!) 97.5 F (36.4 C) (Temporal)   Ht 6' (1.829 m)   Wt 152 lb 6.4 oz (69.1 kg)   SpO2 100%   BMI 20.67 kg/m  Physical Exam Constitutional:      Appearance: Normal appearance.  HENT:     Head: Normocephalic and atraumatic.  Eyes:     Extraocular Movements: Extraocular movements intact.      Conjunctiva/sclera: Conjunctivae normal.  Cardiovascular:     Rate and Rhythm: Normal rate and regular rhythm.  Pulmonary:     Effort: Pulmonary effort is normal.     Breath sounds: Normal breath sounds.  Abdominal:     General: Bowel sounds are normal.     Palpations: Abdomen is soft.  Musculoskeletal:        General: Normal range of motion.     Cervical back: Normal range of motion and neck supple.  Skin:    General: Skin is warm.  Neurological:     General: No focal deficit present.     Mental Status: He is alert and oriented to person, place, and time.  Psychiatric:        Mood and Affect: Mood normal.        Behavior: Behavior normal.      Assessment: *Esophageal dysphagia *Chronic GERD *Esophageal stricture *Abnormal barium swallow study *Weight loss  Plan: Will schedule for urgent EGD with possible dilation to evaluate for peptic ulcer disease, esophagitis, gastritis, H. Pylori, duodenitis, or other. Will also evaluate for esophageal stricture, Schatzki's ring, esophageal web or other.   The risks including infection, bleed, or perforation as well as benefits, limitations, alternatives and imponderables have been reviewed with the patient. Potential for esophageal dilation, biopsy, etc. have also been reviewed.  Questions have been answered. All parties agreeable.  I will start patient on omeprazole 40 mg daily to help with any acid reflux which may be contributing.  Patient to avoid tough textures. All meats should be chopped finely. Eat slowly, take small bites, chew thoroughly, and drink plenty of liquids throughout meals. Advised if something were to get hung in her esophagus and not come up or go down, she should proceed to the emergency room.  Further recommendations to follow.  07/12/2022 11:06 AM   Disclaimer: This note was dictated with voice recognition software. Similar sounding words can inadvertently be transcribed and may not be corrected upon  review.

## 2022-07-12 NOTE — Patient Instructions (Signed)
We will schedule you for upper endoscopy to further evaluate your difficulty swallowing and regurgitation of foods.  We will try to get this done as soon as possible.  I may elect to stretch your esophagus depending on findings.  I am going to send in omeprazole 40 mg daily to take for your acid reflux and difficulty swallowing as well.  Further recommendations to follow.  It was nice meeting both you today.  Dr. Abbey Chatters

## 2022-07-13 ENCOUNTER — Telehealth: Payer: Self-pay

## 2022-07-13 ENCOUNTER — Other Ambulatory Visit: Payer: Self-pay

## 2022-07-13 DIAGNOSIS — E785 Hyperlipidemia, unspecified: Secondary | ICD-10-CM | POA: Diagnosis not present

## 2022-07-13 DIAGNOSIS — E1165 Type 2 diabetes mellitus with hyperglycemia: Secondary | ICD-10-CM | POA: Diagnosis not present

## 2022-07-13 MED ORDER — OMEPRAZOLE 40 MG PO CPDR: 40.0000 mg | DELAYED_RELEASE_CAPSULE | Freq: Every day | ORAL | 3 refills | Status: AC

## 2022-07-13 NOTE — Telephone Encounter (Signed)
FYI Dr. Abbey Chatters: Pt's granddaugter called and left a message stating that the patient's new Rx for omeprazole needed to be sent in to [facility]. I referred back to office note and seen that the patient's current dose of Omeprazole 20 mg daily was being increased to 40 mg daily, due to this I sent a new Rx for omeprazole 40 mg once daily qty: 90 with 3 refills to [place].

## 2022-07-17 ENCOUNTER — Telehealth: Payer: Self-pay | Admitting: *Deleted

## 2022-07-17 ENCOUNTER — Encounter: Payer: Self-pay | Admitting: *Deleted

## 2022-07-17 NOTE — Telephone Encounter (Addendum)
Son came in and stated pt tested positive for covid on 9/2. Has cough/congestion.  Spoke with endo, can have procedure 10 days after + and reports if still having symptoms then will need to reschedule.   Per son pt is having cough/congestion. Pt rescheduled to 9/26 at 11am. Aware will mail new instructions/pre-op appt. Asked for pre-op to be on 9/25.

## 2022-07-18 NOTE — Telephone Encounter (Signed)
Thank you :)

## 2022-07-23 ENCOUNTER — Encounter (HOSPITAL_COMMUNITY): Payer: Medicare Other

## 2022-08-02 NOTE — Patient Instructions (Signed)
Jose Chase  08/02/2022     '@PREFPERIOPPHARMACY'$ @   Your procedure is scheduled on  08/07/2022.   Report to [facility] at  0900 A.M.   Call this number if you have problems the morning of surgery:  [phone]   Remember:  Follow the diet and prep instructions given to you by the office.          DO NOT smoke tobacco or vape for 24 hours before your procedure.     Take these medicines the morning of surgery with A SIP OF WATER                                             prilosec.    Do not wear jewelry, make-up or nail polish.  Do not wear lotions, powders, or perfumes, or deodorant.  Do not shave 48 hours prior to surgery.  Men may shave face and neck.  Do not bring valuables to the hospital.  [facility] is not responsible for any belongings or valuables.  Contacts, dentures or bridgework may not be worn into surgery.  Leave your suitcase in the car.  After surgery it may be brought to your room.  For patients admitted to the hospital, discharge time will be determined by your treatment team.  Patients discharged the day of surgery will not be allowed to drive home and must have someone with them for 24 hours.    Special instructions:   DO NOT smoke tobacco or vape for 24 hours before your procedure.  Please read over the following fact sheets that you were given. Anesthesia Post-op Instructions and Care and Recovery After Surgery      Upper Endoscopy, Adult, Care After After the procedure, it is common to have a sore throat. It is also common to have: Mild stomach pain or discomfort. Bloating. Nausea. Follow these instructions at home: The instructions below may help you care for yourself at home. Your health care provider may give you more instructions. If you have questions, ask your health care provider. If you were given a sedative during the procedure, it can affect you for several hours. Do not drive or operate machinery until your health care  provider says that it is safe. If you will be going home right after the procedure, plan to have a responsible adult: Take you home from the hospital or clinic. You will not be allowed to drive. Care for you for the time you are told. Follow instructions from your health care provider about what you may eat and drink. Return to your normal activities as told by your health care provider. Ask your health care provider what activities are safe for you. Take over-the-counter and prescription medicines only as told by your health care provider. Contact a health care provider if you: Have a sore throat that lasts longer than one day. Have trouble swallowing. Have a fever. Get help right away if you: Vomit blood or your vomit looks like coffee grounds. Have bloody, black, or tarry stools. Have a very bad sore throat or you cannot swallow. Have difficulty breathing or very bad pain in your chest or abdomen. These symptoms may be an emergency. Get help right away. Call 911. Do not wait to see if the symptoms will go away. Do not drive yourself to the hospital. Summary  After the procedure, it is common to have a sore throat, mild stomach discomfort, bloating, and nausea. If you were given a sedative during the procedure, it can affect you for several hours. Do not drive until your health care provider says that it is safe. Follow instructions from your health care provider about what you may eat and drink. Return to your normal activities as told by your health care provider. This information is not intended to replace advice given to you by your health care provider. Make sure you discuss any questions you have with your health care provider. Document Revised: 02/07/2022 Document Reviewed: 02/07/2022 Elsevier Patient Education  [place]. Esophageal Dilatation Esophageal dilatation, also called esophageal dilation, is a procedure to widen or open a blocked or narrowed part of the  esophagus. The esophagus is the part of the body that moves food and liquid from the mouth to the stomach. You may need this procedure if: You have a buildup of scar tissue in your esophagus that makes it difficult, painful, or impossible to swallow. This can be caused by gastroesophageal reflux disease (GERD). You have cancer of the esophagus. There is a problem with how food moves through your esophagus. In some cases, you may need this procedure repeated at a later time to dilate the esophagus gradually. Tell a health care provider about: Any allergies you have. All medicines you are taking, including vitamins, herbs, eye drops, creams, and over-the-counter medicines. Any problems you or family members have had with anesthetic medicines. Any blood disorders you have. Any surgeries you have had. Any medical conditions you have. Any antibiotic medicines you are required to take before dental procedures. Whether you are pregnant or may be pregnant. What are the risks? Generally, this is a safe procedure. However, problems may occur, including: Bleeding due to a tear in the lining of the esophagus. A hole, or perforation, in the esophagus. What happens before the procedure? Ask your health care provider about: Changing or stopping your regular medicines. This is especially important if you are taking diabetes medicines or blood thinners. Taking medicines such as aspirin and ibuprofen. These medicines can thin your blood. Do not take these medicines unless your health care provider tells you to take them. Taking over-the-counter medicines, vitamins, herbs, and supplements. Follow instructions from your health care provider about eating or drinking restrictions. Plan to have a responsible adult take you home from the hospital or clinic. Plan to have a responsible adult care for you for the time you are told after you leave the hospital or clinic. This is important. What happens during the  procedure? You may be given a medicine to help you relax (sedative). A numbing medicine may be sprayed into the back of your throat, or you may gargle the medicine. Your health care provider may perform the dilatation using various surgical instruments, such as: Simple dilators. This instrument is carefully placed in the esophagus to stretch it. Guided wire bougies. This involves using an endoscope to insert a wire into the esophagus. A dilator is passed over this wire to enlarge the esophagus. Then the wire is removed. Balloon dilators. An endoscope with a small balloon is inserted into the esophagus. The balloon is inflated to stretch the esophagus and open it up. The procedure may vary among health care providers and hospitals. What can I expect after the procedure? Your blood pressure, heart rate, breathing rate, and blood oxygen level will be monitored until you leave the hospital or clinic.  Your throat may feel slightly sore and numb. This will get better over time. You will not be allowed to eat or drink until your throat is no longer numb. When you are able to drink, urinate, and sit on the edge of the bed without nausea or dizziness, you may be able to return home. Follow these instructions at home: Take over-the-counter and prescription medicines only as told by your health care provider. If you were given a sedative during the procedure, it can affect you for several hours. Do not drive or operate machinery until your health care provider says that it is safe. Plan to have a responsible adult care for you for the time you are told. This is important. Follow instructions from your health care provider about any eating or drinking restrictions. Do not use any products that contain nicotine or tobacco, such as cigarettes, e-cigarettes, and chewing tobacco. If you need help quitting, ask your health care provider. Keep all follow-up visits. This is important. Contact a health care provider  if: You have a fever. You have pain that is not relieved by medicine. Get help right away if: You have chest pain. You have trouble breathing. You have trouble swallowing. You vomit blood. You have black, tarry, or bloody stools. These symptoms may represent a serious problem that is an emergency. Do not wait to see if the symptoms will go away. Get medical help right away. Call your local emergency services (911 in the [place] Do not drive yourself to the hospital. Summary Esophageal dilatation, also called esophageal dilation, is a procedure to widen or open a blocked or narrowed part of the esophagus. Plan to have a responsible adult take you home from the hospital or clinic. For this procedure, a numbing medicine may be sprayed into the back of your throat, or you may gargle the medicine. Do not drive or operate machinery until your health care provider says that it is safe. This information is not intended to replace advice given to you by your health care provider. Make sure you discuss any questions you have with your health care provider. Document Revised: 03/16/2020 Document Reviewed: 03/16/2020 Elsevier Patient Education  [place] After This sheet gives you information about how to care for yourself after your procedure. Your health care provider may also give you more specific instructions. If you have problems or questions, contact your health care provider. What can I expect after the procedure? After the procedure, it is common to have: Tiredness. Forgetfulness about what happened after the procedure. Impaired judgment for important decisions. Nausea or vomiting. Some difficulty with balance. Follow these instructions at home: For the time period you were told by your health care provider:     Rest as needed. Do not participate in activities where you could fall or become injured. Do not drive or use machinery. Do not drink  alcohol. Do not take sleeping pills or medicines that cause drowsiness. Do not make important decisions or sign legal documents. Do not take care of children on your own. Eating and drinking Follow the diet that is recommended by your health care provider. Drink enough fluid to keep your urine pale yellow. If you vomit: Drink water, juice, or soup when you can drink without vomiting. Make sure you have little or no nausea before eating solid foods. General instructions Have a responsible adult stay with you for the time you are told. It is important to have someone help care for  you until you are awake and alert. Take over-the-counter and prescription medicines only as told by your health care provider. If you have sleep apnea, surgery and certain medicines can increase your risk for breathing problems. Follow instructions from your health care provider about wearing your sleep device: Anytime you are sleeping, including during daytime naps. While taking prescription pain medicines, sleeping medicines, or medicines that make you drowsy. Avoid smoking. Keep all follow-up visits as told by your health care provider. This is important. Contact a health care provider if: You keep feeling nauseous or you keep vomiting. You feel light-headed. You are still sleepy or having trouble with balance after 24 hours. You develop a rash. You have a fever. You have redness or swelling around the IV site. Get help right away if: You have trouble breathing. You have new-onset confusion at home. Summary For several hours after your procedure, you may feel tired. You may also be forgetful and have poor judgment. Have a responsible adult stay with you for the time you are told. It is important to have someone help care for you until you are awake and alert. Rest as told. Do not drive or operate machinery. Do not drink alcohol or take sleeping pills. Get help right away if you have trouble breathing, or if  you suddenly become confused. This information is not intended to replace advice given to you by your health care provider. Make sure you discuss any questions you have with your health care provider. Document Revised: 10/03/2021 Document Reviewed: 10/01/2019 Elsevier Patient Education  [place].

## 2022-08-06 ENCOUNTER — Encounter (HOSPITAL_COMMUNITY): Payer: Self-pay

## 2022-08-06 ENCOUNTER — Encounter (HOSPITAL_COMMUNITY)
Admission: RE | Admit: 2022-08-06 | Discharge: 2022-08-06 | Disposition: A | Discharge status: Home / Self Care | Payer: Medicare Other | Source: Ambulatory Visit | Attending: Internal Medicine | Admitting: Internal Medicine

## 2022-08-06 VITALS — BP 130/75 | HR 56 | Temp 97.8°F | Resp 18 | Ht 72.0 in | Wt 152.3 lb

## 2022-08-06 DIAGNOSIS — Z862 Personal history of diseases of the blood and blood-forming organs and certain disorders involving the immune mechanism: Secondary | ICD-10-CM | POA: Diagnosis not present

## 2022-08-06 DIAGNOSIS — Z01812 Encounter for preprocedural laboratory examination: Secondary | ICD-10-CM | POA: Insufficient documentation

## 2022-08-06 DIAGNOSIS — E119 Type 2 diabetes mellitus without complications: Secondary | ICD-10-CM | POA: Diagnosis not present

## 2022-08-06 HISTORY — DX: Dyspnea, unspecified: R06.00

## 2022-08-06 HISTORY — DX: Gastro-esophageal reflux disease without esophagitis: K21.9

## 2022-08-06 LAB — CBC WITH DIFFERENTIAL/PLATELET
Abs Immature Granulocytes: 0.02 10*3/uL (ref 0.00–0.07)
Basophils Absolute: 0.1 10*3/uL (ref 0.0–0.1)
Basophils Relative: 1 %
Eosinophils Absolute: 0.3 10*3/uL (ref 0.0–0.5)
Eosinophils Relative: 5 %
HCT: 38.7 % — ABNORMAL LOW (ref 39.0–52.0)
Hemoglobin: 11.5 g/dL — ABNORMAL LOW (ref 13.0–17.0)
Immature Granulocytes: 0 %
Lymphocytes Relative: 19 %
Lymphs Abs: 1.2 10*3/uL (ref 0.7–4.0)
MCH: 21.1 pg — ABNORMAL LOW (ref 26.0–34.0)
MCHC: 29.7 g/dL — ABNORMAL LOW (ref 30.0–36.0)
MCV: 71 fL — ABNORMAL LOW (ref 80.0–100.0)
Monocytes Absolute: 0.5 10*3/uL (ref 0.1–1.0)
Monocytes Relative: 9 %
Neutro Abs: 4.1 10*3/uL (ref 1.7–7.7)
Neutrophils Relative %: 66 %
Platelets: 428 10*3/uL — ABNORMAL HIGH (ref 150–400)
RBC: 5.45 MIL/uL (ref 4.22–5.81)
RDW: 22.4 % — ABNORMAL HIGH (ref 11.5–15.5)
WBC: 6.2 10*3/uL (ref 4.0–10.5)
nRBC: 0 % (ref 0.0–0.2)

## 2022-08-06 LAB — BASIC METABOLIC PANEL
Anion gap: 7 (ref 5–15)
BUN: 17 mg/dL (ref 8–23)
CO2: 23 mmol/L (ref 22–32)
Calcium: 8.7 mg/dL — ABNORMAL LOW (ref 8.9–10.3)
Chloride: 110 mmol/L (ref 98–111)
Creatinine, Ser: 1.05 mg/dL (ref 0.61–1.24)
GFR, Estimated: >60 mL/min (ref >60)
Glucose, Bld: 96 mg/dL (ref 70–99)
Potassium: 4.2 mmol/L (ref 3.5–5.1)
Sodium: 140 mmol/L (ref 135–145)

## 2022-08-07 ENCOUNTER — Ambulatory Visit (HOSPITAL_COMMUNITY): Payer: Medicare Other | Admitting: Anesthesiology

## 2022-08-07 ENCOUNTER — Other Ambulatory Visit (INDEPENDENT_AMBULATORY_CARE_PROVIDER_SITE_OTHER): Payer: Medicare Other

## 2022-08-07 ENCOUNTER — Encounter (HOSPITAL_COMMUNITY): Payer: Self-pay

## 2022-08-07 ENCOUNTER — Encounter (HOSPITAL_COMMUNITY)
Admission: RE | Disposition: A | Discharge status: Home / Self Care | Payer: Self-pay | Source: Home / Self Care | Attending: Emergency Medicine

## 2022-08-07 ENCOUNTER — Ambulatory Visit (HOSPITAL_COMMUNITY)
Admission: RE | Admit: 2022-08-07 | Discharge: 2022-08-07 | Disposition: A | Discharge status: Home / Self Care | Payer: Medicare Other | Attending: Emergency Medicine | Admitting: Emergency Medicine

## 2022-08-07 ENCOUNTER — Other Ambulatory Visit: Payer: Self-pay

## 2022-08-07 ENCOUNTER — Telehealth: Payer: Self-pay | Admitting: *Deleted

## 2022-08-07 ENCOUNTER — Ambulatory Visit (HOSPITAL_COMMUNITY): Payer: Medicare Other

## 2022-08-07 DIAGNOSIS — Z681 Body mass index (BMI) 19 or less, adult: Secondary | ICD-10-CM | POA: Diagnosis not present

## 2022-08-07 DIAGNOSIS — K222 Esophageal obstruction: Secondary | ICD-10-CM | POA: Diagnosis not present

## 2022-08-07 DIAGNOSIS — K219 Gastro-esophageal reflux disease without esophagitis: Secondary | ICD-10-CM | POA: Diagnosis not present

## 2022-08-07 DIAGNOSIS — I441 Atrioventricular block, second degree: Secondary | ICD-10-CM

## 2022-08-07 DIAGNOSIS — R001 Bradycardia, unspecified: Secondary | ICD-10-CM | POA: Diagnosis not present

## 2022-08-07 DIAGNOSIS — Z87891 Personal history of nicotine dependence: Secondary | ICD-10-CM | POA: Diagnosis not present

## 2022-08-07 DIAGNOSIS — R1310 Dysphagia, unspecified: Secondary | ICD-10-CM | POA: Diagnosis not present

## 2022-08-07 DIAGNOSIS — I700 Atherosclerosis of aorta: Secondary | ICD-10-CM | POA: Insufficient documentation

## 2022-08-07 DIAGNOSIS — R634 Abnormal weight loss: Secondary | ICD-10-CM | POA: Diagnosis not present

## 2022-08-07 DIAGNOSIS — E119 Type 2 diabetes mellitus without complications: Secondary | ICD-10-CM | POA: Insufficient documentation

## 2022-08-07 DIAGNOSIS — Z5309 Procedure and treatment not carried out because of other contraindication: Secondary | ICD-10-CM | POA: Insufficient documentation

## 2022-08-07 DIAGNOSIS — Z8546 Personal history of malignant neoplasm of prostate: Secondary | ICD-10-CM | POA: Insufficient documentation

## 2022-08-07 LAB — COMPREHENSIVE METABOLIC PANEL
ALT: 12 U/L (ref 0–44)
AST: 22 U/L (ref 15–41)
Albumin: 3.4 g/dL — ABNORMAL LOW (ref 3.5–5.0)
Alkaline Phosphatase: 60 U/L (ref 38–126)
Anion gap: 9 (ref 5–15)
BUN: 15 mg/dL (ref 8–23)
CO2: 23 mmol/L (ref 22–32)
Calcium: 8.6 mg/dL — ABNORMAL LOW (ref 8.9–10.3)
Chloride: 108 mmol/L (ref 98–111)
Creatinine, Ser: 1.03 mg/dL (ref 0.61–1.24)
GFR, Estimated: >60 mL/min (ref >60)
Glucose, Bld: 74 mg/dL (ref 70–99)
Potassium: 4 mmol/L (ref 3.5–5.1)
Sodium: 140 mmol/L (ref 135–145)
Total Bilirubin: 1 mg/dL (ref 0.3–1.2)
Total Protein: 6.9 g/dL (ref 6.5–8.1)

## 2022-08-07 LAB — CBC WITH DIFFERENTIAL/PLATELET
Abs Immature Granulocytes: 0.01 10*3/uL (ref 0.00–0.07)
Basophils Absolute: 0.1 10*3/uL (ref 0.0–0.1)
Basophils Relative: 1 %
Eosinophils Absolute: 0.2 10*3/uL (ref 0.0–0.5)
Eosinophils Relative: 4 %
HCT: 36.9 % — ABNORMAL LOW (ref 39.0–52.0)
Hemoglobin: 11 g/dL — ABNORMAL LOW (ref 13.0–17.0)
Immature Granulocytes: 0 %
Lymphocytes Relative: 21 %
Lymphs Abs: 1.1 10*3/uL (ref 0.7–4.0)
MCH: 21.1 pg — ABNORMAL LOW (ref 26.0–34.0)
MCHC: 29.8 g/dL — ABNORMAL LOW (ref 30.0–36.0)
MCV: 70.7 fL — ABNORMAL LOW (ref 80.0–100.0)
Monocytes Absolute: 0.6 10*3/uL (ref 0.1–1.0)
Monocytes Relative: 11 %
Neutro Abs: 3.3 10*3/uL (ref 1.7–7.7)
Neutrophils Relative %: 63 %
Platelets: 333 10*3/uL (ref 150–400)
RBC: 5.22 MIL/uL (ref 4.22–5.81)
RDW: 22.1 % — ABNORMAL HIGH (ref 11.5–15.5)
WBC: 5.2 10*3/uL (ref 4.0–10.5)
nRBC: 0 % (ref 0.0–0.2)

## 2022-08-07 LAB — GLUCOSE, CAPILLARY: Glucose-Capillary: 78 mg/dL (ref 70–99)

## 2022-08-07 SURGERY — ESOPHAGOGASTRODUODENOSCOPY (EGD) WITH PROPOFOL
Anesthesia: General

## 2022-08-07 NOTE — Telephone Encounter (Signed)
Verbal order from Dr. Domenic Polite to place Live Zio monitor.

## 2022-08-07 NOTE — ED Provider Notes (Addendum)
[facility] EMERGENCY DEPARTMENT Provider Note   CSN: [id number] Arrival date & time: 08/07/22  1134     History  Chief Complaint  Patient presents with   Dizziness    Jose Chase is a 88 y.o. male.  Patient sent over from endoscopy for a first-degree AV block heart rhythm patient was there to have an upper endoscopy done to have his esophagus stretched.  He had a stretch test several years ago and here recently been having a lot of trouble with food getting trapped.  And is pretty much stuck with just doing soft food and liquid.  Dr. Abbey Chatters was planning to do the upper endoscopy it was not done and he did not receive any anesthesia or sedation.  Past medical history significant for the esophageal stricture diabetes prostate cancer longstanding gastroesophageal reflux disease.  Patient former smoker quit 1974.           Home Medications Prior to Admission medications   Medication Sig Start Date End Date Taking? Authorizing Provider  ALLERGY RELIEF 10 MG tablet Take 10 mg by mouth daily. 02/20/22  Yes [provider]  ferrous sulfate 325 (65 FE) MG tablet Take 325 mg by mouth daily with breakfast.   Yes [provider]  gabapentin (NEURONTIN) 100 MG capsule Take 100 mg by mouth at bedtime. 04/25/22  Yes [provider]  glipiZIDE (GLUCOTROL) 10 MG tablet Take 10 mg by mouth daily before breakfast.   Yes [provider]  metFORMIN (GLUCOPHAGE) 500 MG tablet Take 1,000 mg by mouth 2 (two) times daily with a meal.    Yes [provider]  Multiple Vitamins-Minerals (MEGA MULTI MEN PO) Take by mouth. Takes one tablet daily   Yes [provider]  omeprazole (PRILOSEC) 40 MG capsule Take 1 capsule (40 mg total) by mouth daily. 07/13/22  Yes Carver, Charles K, DO  simvastatin (ZOCOR) 20 MG tablet Take 20 mg by mouth at bedtime.     Yes [provider]      Allergies    Patient has no known allergies.    Review of Systems    Review of Systems  Constitutional:  Negative for chills and fever.  HENT:  Negative for ear pain and sore throat.   Eyes:  Negative for pain and visual disturbance.  Respiratory:  Negative for cough and shortness of breath.   Cardiovascular:  Positive for leg swelling. Negative for chest pain and palpitations.  Gastrointestinal:  Negative for abdominal pain and vomiting.  Genitourinary:  Negative for dysuria and hematuria.  Musculoskeletal:  Negative for arthralgias and back pain.  Skin:  Negative for color change and rash.  Neurological:  Positive for light-headedness. Negative for seizures and syncope.  All other systems reviewed and are negative.   Physical Exam Updated Vital Signs BP (!) 164/106   Pulse (!) 49   Temp 97.7 F (36.5 C) (Oral)   Resp 14   Ht 1.829 m (6')   Wt 65.8 kg   SpO2 100%   BMI 19.67 kg/m  Physical Exam Vitals and nursing note reviewed.  Constitutional:      General: He is not in acute distress.    Appearance: Normal appearance. He is well-developed.  HENT:     Head: Normocephalic and atraumatic.  Eyes:     Extraocular Movements: Extraocular movements intact.     Conjunctiva/sclera: Conjunctivae normal.     Pupils: Pupils are equal, round, and reactive to light.  Cardiovascular:  Rate and Rhythm: Regular rhythm. Bradycardia present.     Heart sounds: No murmur heard. Pulmonary:     Effort: Pulmonary effort is normal. No respiratory distress.     Breath sounds: Normal breath sounds.  Abdominal:     Palpations: Abdomen is soft.     Tenderness: There is no abdominal tenderness.  Musculoskeletal:        General: Swelling present.     Cervical back: Normal range of motion and neck supple.     Right lower leg: Edema present.     Left lower leg: Edema present.     Comments: Trace edema to both lower extremities.  Skin:    General: Skin is warm and dry.     Capillary Refill: Capillary refill takes less than 2 seconds.  Neurological:      General: No focal deficit present.     Mental Status: He is alert and oriented to person, place, and time.     Cranial Nerves: No cranial nerve deficit.     Sensory: No sensory deficit.  Psychiatric:        Mood and Affect: Mood normal.     ED Results / Procedures / Treatments   Labs (all labs ordered are listed, but only abnormal results are displayed) Labs Reviewed  CBC WITH DIFFERENTIAL/PLATELET - Abnormal; Notable for the following components:      Result Value   Hemoglobin 11.0 (*)    HCT 36.9 (*)    MCV 70.7 (*)    MCH 21.1 (*)    MCHC 29.8 (*)    RDW 22.1 (*)    All other components within normal limits  COMPREHENSIVE METABOLIC PANEL - Abnormal; Notable for the following components:   Calcium 8.6 (*)    Albumin 3.4 (*)    All other components within normal limits  GLUCOSE, CAPILLARY    EKG EKG Interpretation  Date/Time:  Tuesday August 07 2022 11:33:08 EDT Ventricular Rate:  55 PR Interval:  409 QRS Duration: 105 QT Interval:  482 QTC Calculation: 461 R Axis:   74 Text Interpretation: Second degree AV block, Mobitz II I suspect that it is Mobitz type I New since previous tracing Reconfirmed by Fredia Sorrow [phone]) on 08/07/2022 11:49:11 AM  Radiology DG Chest Port 1 View  Result Date: 08/07/2022 CLINICAL DATA:  Bradycardia. EXAM: PORTABLE CHEST 1 VIEW COMPARISON:  10/17/2007 FINDINGS: Heart size and mediastinal contours appear normal. Aortic atherosclerotic calcifications. No signs of pleural effusion or edema. No airspace opacities. The visualized osseous structures appear grossly intact. IMPRESSION: No active cardiopulmonary abnormalities. Electronically Signed   By: Kerby Moors M.D.   On: 08/07/2022 12:11    Procedures Procedures    Medications Ordered in ED Medications - No data to display  ED Course/ Medical Decision Making/ A&P                           Medical Decision Making Amount and/or Complexity of Data Reviewed Labs:  ordered. Radiology: ordered.   Cardiac monitoring here raise some concerns for combination of Mobitz type I and Mobitz type II second-degree AV block.  But did not have any 2-1 type block.  Discussed with Dr. Domenic Polite from cardiology who reviewed the EKGs.  Patient completely asymptomatic.  They set him up for cardiac monitoring and brought over his device and they will see him in the office tomorrow he felt that if he was asymptomatic with ambulation that he could be  discharged with precautions.  Cardiac monitoring showed a heart rate ranging anywhere from the upper 40s to the 60s.  CBC no leukocytosis hemoglobin 11 complete metabolic panel without any acute abnormalities renal function normal.  Chest x-ray no active cardiopulmonary abnormalities.  We did a total of 3 EKGs the one in the middle was suggestive of more Mobitz type I.  And the third 1 was more suggestive of Mobitz type II.  No evidence of 2-1 block.  Patient was completely asymptomatic with standing orthostatic blood pressures he did drop his blood pressure with standing but he was asymptomatic.  Blood pressure did come down to 315 systolic.  Patient be discharged home with precautions.  Close follow-up with cardiology.  Patient has cardiac monitoring device in place.  And he knows to keep that dry for 24 hours.  Will return for any new or worse symptoms particularly if there is any passing out.  CRITICAL CARE Performed by: Fredia Sorrow Total critical care time: 35 minutes Critical care time was exclusive of separately billable procedures and treating other patients. Critical care was necessary to treat or prevent imminent or life-threatening deterioration. Critical care was time spent personally by me on the following activities: development of treatment plan with patient and/or surrogate as well as nursing, discussions with consultants, evaluation of patient's response to treatment, examination of patient, obtaining history  from patient or surrogate, ordering and performing treatments and interventions, ordering and review of laboratory studies, ordering and review of radiographic studies, pulse oximetry and re-evaluation of patient's condition.  Final Clinical Impression(s) / ED Diagnoses Final diagnoses:  Second degree heart block    Rx / DC Orders ED Discharge Orders     None         Fredia Sorrow, MD 08/07/22 1543    Fredia Sorrow, MD 08/07/22 [phone]

## 2022-08-07 NOTE — Progress Notes (Signed)
Reported cbg to Dr. Briant Cedar. No orders given. Pt stable

## 2022-08-07 NOTE — Discharge Instructions (Signed)
Follow-up with cardiology work-up here today showed a second-degree AV block heart block.  Cardiology has the monitor on you call them if they do not call you before Monday for your appointment next week.  Return for any new or worse symptoms.  Return for passing out.  Would take it easy in the meantime because you will have some periods of lightheadedness.

## 2022-08-07 NOTE — Interval H&P Note (Signed)
History and Physical Interval Note:  08/07/2022 10:42 AM  Jose Chase  has presented today for surgery, with the diagnosis of dysphagia.  The various methods of treatment have been discussed with the patient and family. After consideration of risks, benefits and other options for treatment, the patient has consented to  Procedure(s) with comments: ESOPHAGOGASTRODUODENOSCOPY (EGD) WITH PROPOFOL (N/A) - 2:00pm, asa 3 BALLOON DILATION (N/A) as a surgical intervention.  The patient's history has been reviewed, patient examined, no change in status, stable for surgery.  I have reviewed the patient's chart and labs.  Questions were answered to the patient's satisfaction.     Eloise Harman

## 2022-08-07 NOTE — Anesthesia Preprocedure Evaluation (Deleted)
Anesthesia Evaluation  Patient identified by MRN, date of birth, ID band Patient awake    Reviewed: Allergy & Precautions, H&P , NPO status , Patient's Chart, lab work & pertinent test results, reviewed documented beta blocker date and time   Airway Mallampati: II  TM Distance: >3 FB Neck ROM: full    Dental no notable dental hx.    Pulmonary shortness of breath, former smoker,    Pulmonary exam normal breath sounds clear to auscultation       Cardiovascular Exercise Tolerance: Good negative cardio ROS   Rhythm:regular Rate:Normal     Neuro/Psych  Neuromuscular disease negative psych ROS   GI/Hepatic Neg liver ROS, hiatal hernia, GERD  Medicated,  Endo/Other  negative endocrine ROSdiabetes, Type 2  Renal/GU negative Renal ROS  negative genitourinary   Musculoskeletal   Abdominal   Peds  Hematology negative hematology ROS (+)   Anesthesia Other Findings   Reproductive/Obstetrics negative OB ROS                             Anesthesia Physical Anesthesia Plan  ASA: 3  Anesthesia Plan: General   Post-op Pain Management:    Induction:   PONV Risk Score and Plan: Propofol infusion  Airway Management Planned:   Additional Equipment:   Intra-op Plan:   Post-operative Plan:   Informed Consent: I have reviewed the patients History and Physical, chart, labs and discussed the procedure including the risks, benefits and alternatives for the proposed anesthesia with the patient or authorized representative who has indicated his/her understanding and acceptance.     Dental Advisory Given  Plan Discussed with: CRNA  Anesthesia Plan Comments:         Anesthesia Quick Evaluation

## 2022-08-07 NOTE — Progress Notes (Signed)
Procedure cancelled due to new onset heart block on monitor.  Strip captured.  Patient asymptomatic.  Referred to ER and patient's wife and son notified.

## 2022-08-07 NOTE — ED Triage Notes (Signed)
Pt to er room number 12 via pre op stretcher,  per op RN states that pt was going to have an endoscopy today and when they did his ekg it was in a first degree av block, pt denies chest pain, pt states that he has had some dizziness for a long time.  Pt appears to be in a mobitz 2 like rhythm. Ekg complete and take to md zackowski.

## 2022-08-08 DIAGNOSIS — I441 Atrioventricular block, second degree: Secondary | ICD-10-CM | POA: Diagnosis not present

## 2022-08-09 ENCOUNTER — Telehealth: Payer: Self-pay | Admitting: Physician Assistant

## 2022-08-09 NOTE — Telephone Encounter (Addendum)
[employer] contacted me because the patient had a symptomatic episode of Mobitz 1, possibly Mobitz 2 heart block.  He reported symptoms with this, the company has not been able to reach him.  His phone is not accepting calls, I will try to reach the son. I left messages on his home and cell phones, no response.  I tried to call again and got him on his cell phone.   I spoke to Ms Federico Maiorino, she stated her husband had the hiccups this afternoon and got a little dizzy >> that was at the time of the bradycardia. He is fine now.  The requested that we call the son's cell number if we ned to reach them.   [employer] will fax the strips to the office and I will route the note to Dr Dellia Cloud.  Rosaria Ferries, PA-C 08/09/2022 7:45 PM

## 2022-08-10 ENCOUNTER — Telehealth (INDEPENDENT_AMBULATORY_CARE_PROVIDER_SITE_OTHER): Payer: Medicare Other | Admitting: Internal Medicine

## 2022-08-10 NOTE — Progress Notes (Deleted)
Patient is a 88 year old African-American male known to have prostate cancer, dysphagia s/p EGD dilatation, diabetes not on insulin, reported history of hyperlipidemia on simvastatin was referred to cardiology clinic for evaluation of AV block.  Patient has been having dysphagia to solid/liquid diet for which he was scheduled to undergo EGD plus or minus dilatation for possible esophageal stricture.  However the EGD suite noticed AV block on the monitor and sent the patient to the ER for further evaluation.  Serial EKGs have been performed in the ER which showed extremely prolonged PR interval (first-degree AV block) and second-degree Mobitz type I AV block. There was no evidence of second-degree Mobitz type II AV block on any EKG I have reviewed on the epic.  Furthermore, ER contacted cardiology onsite who recommended evaluation of chronotropic competence and placed live ZIO monitor.  He was noted to have chronotropic competence on ambulation and hence he was discharged to home to return back to cardiology clinic in a week. However in a few days, [employer] notified the cardiology on-call that patient had Mobitz type I AV block and possible Mobitz type II AV block based on rhythm strips and also patient had hiccups associated with dizziness at the same time.  To note, patient has been having hiccups for almost a decade and dizziness x 5-6 years.  Patient was also diagnosed with hyperlipidemia in the past for which she was placed on lovastatin initially which was switched to simvastatin.  I reviewed the lipid panel which was normal many years ago.  There is no lipid panel recently. Patient is here in the cardiology clinic for further evaluation.   #Second-degree Mobitz type I AV block Plan -All the EKGs on the epic showed first-degree AV block and second-degree Mobitz type I AV block. No evidence of second-degree Mobitz type II AV block on the EKG.  -Avoid any AV nodal blocking  agents  #Hyperlipidemia Plan -Obtain lipid panel -Continue simvastatin 20 mg for now.  Will consider discontinuing after reviewing the lipid panel results.  #Primary prevention of coronary artery disease Plan -Patient does not meet criteria to be on aspirin for primary prevention of coronary artery disease. Stop aspirin.  #Elevated blood pressure readings Plan -Start amlodipine 5 mg once daily  Follow-up in 3 months Lipid panel before the next visit

## 2022-08-10 NOTE — Telephone Encounter (Signed)
[employer] notified on-call cardiology provider regarding the findings of possible Mobitz Type II AVB on the Live Zio monitor on 08/09/22 and faxed the strips on 08/10/22 Pm.  I called the number, [phone]. Wife answered the phone on behalf of the patient. She reported ongoing dizziness x 5-6 years and hiccups x decade. Denied any syncope ever, and denied nausea. He is asymptomatic at the time of my call and asymptomatic since the triggered event on 08/09/22. Live Zio showed Mobitz Type I versus II. Unclear if it is Mobitz Type II due to short nature of the strip and artifact immediately following the dropped P wave. Due to insufficient evidence of high grade AV block on the Live Zio monitor, I will wait till I gather more information from the event monitor for the diagnosis of AVB. I will see him in my clinic on 08/15/2022 at 1Pm and consider EP referral for possible EP study +/- PPM.   Vishnu Fidel Levy, MD Consultant Cardiologist

## 2022-08-12 ENCOUNTER — Telehealth: Payer: Self-pay | Admitting: Cardiology

## 2022-08-12 ENCOUNTER — Emergency Department (HOSPITAL_COMMUNITY): Payer: Medicare Other

## 2022-08-12 ENCOUNTER — Encounter (HOSPITAL_COMMUNITY): Payer: Self-pay

## 2022-08-12 ENCOUNTER — Inpatient Hospital Stay (HOSPITAL_COMMUNITY): Payer: Medicare Other

## 2022-08-12 ENCOUNTER — Other Ambulatory Visit: Payer: Self-pay

## 2022-08-12 ENCOUNTER — Inpatient Hospital Stay (HOSPITAL_COMMUNITY)
Admission: EM | Admit: 2022-08-12 | Discharge: 2022-08-14 | DRG: 244 | Disposition: A | Discharge status: Home / Self Care | Payer: Medicare Other | Attending: Cardiovascular Disease | Admitting: Cardiovascular Disease

## 2022-08-12 DIAGNOSIS — D649 Anemia, unspecified: Secondary | ICD-10-CM | POA: Diagnosis not present

## 2022-08-12 DIAGNOSIS — Z8249 Family history of ischemic heart disease and other diseases of the circulatory system: Secondary | ICD-10-CM

## 2022-08-12 DIAGNOSIS — Z7984 Long term (current) use of oral hypoglycemic drugs: Secondary | ICD-10-CM

## 2022-08-12 DIAGNOSIS — K219 Gastro-esophageal reflux disease without esophagitis: Secondary | ICD-10-CM | POA: Diagnosis present

## 2022-08-12 DIAGNOSIS — Z7982 Long term (current) use of aspirin: Secondary | ICD-10-CM

## 2022-08-12 DIAGNOSIS — Z8546 Personal history of malignant neoplasm of prostate: Secondary | ICD-10-CM | POA: Diagnosis not present

## 2022-08-12 DIAGNOSIS — R54 Age-related physical debility: Secondary | ICD-10-CM | POA: Diagnosis present

## 2022-08-12 DIAGNOSIS — E119 Type 2 diabetes mellitus without complications: Secondary | ICD-10-CM | POA: Diagnosis not present

## 2022-08-12 DIAGNOSIS — Z809 Family history of malignant neoplasm, unspecified: Secondary | ICD-10-CM | POA: Diagnosis not present

## 2022-08-12 DIAGNOSIS — I442 Atrioventricular block, complete: Secondary | ICD-10-CM | POA: Diagnosis not present

## 2022-08-12 DIAGNOSIS — R001 Bradycardia, unspecified: Secondary | ICD-10-CM | POA: Diagnosis not present

## 2022-08-12 DIAGNOSIS — Z950 Presence of cardiac pacemaker: Secondary | ICD-10-CM | POA: Diagnosis not present

## 2022-08-12 DIAGNOSIS — Z87891 Personal history of nicotine dependence: Secondary | ICD-10-CM | POA: Diagnosis not present

## 2022-08-12 DIAGNOSIS — R0602 Shortness of breath: Secondary | ICD-10-CM | POA: Diagnosis not present

## 2022-08-12 DIAGNOSIS — Z79899 Other long term (current) drug therapy: Secondary | ICD-10-CM

## 2022-08-12 DIAGNOSIS — M419 Scoliosis, unspecified: Secondary | ICD-10-CM | POA: Diagnosis not present

## 2022-08-12 LAB — GLUCOSE, CAPILLARY
Glucose-Capillary: 145 mg/dL — ABNORMAL HIGH (ref 70–99)
Glucose-Capillary: 122 mg/dL — ABNORMAL HIGH (ref 70–99)

## 2022-08-12 LAB — CBC WITH DIFFERENTIAL/PLATELET
Abs Immature Granulocytes: 0.02 10*3/uL (ref 0.00–0.07)
Basophils Absolute: 0 10*3/uL (ref 0.0–0.1)
Basophils Relative: 1 %
Eosinophils Absolute: 0.3 10*3/uL (ref 0.0–0.5)
Eosinophils Relative: 6 %
HCT: 39.3 % (ref 39.0–52.0)
Hemoglobin: 11.6 g/dL — ABNORMAL LOW (ref 13.0–17.0)
Immature Granulocytes: 0 %
Lymphocytes Relative: 26 %
Lymphs Abs: 1.4 10*3/uL (ref 0.7–4.0)
MCH: 21.1 pg — ABNORMAL LOW (ref 26.0–34.0)
MCHC: 29.5 g/dL — ABNORMAL LOW (ref 30.0–36.0)
MCV: 71.6 fL — ABNORMAL LOW (ref 80.0–100.0)
Monocytes Absolute: 0.5 10*3/uL (ref 0.1–1.0)
Monocytes Relative: 9 %
Neutro Abs: 3.2 10*3/uL (ref 1.7–7.7)
Neutrophils Relative %: 58 %
Platelets: 360 10*3/uL (ref 150–400)
RBC: 5.49 MIL/uL (ref 4.22–5.81)
RDW: 22 % — ABNORMAL HIGH (ref 11.5–15.5)
WBC: 5.5 10*3/uL (ref 4.0–10.5)
nRBC: 0 % (ref 0.0–0.2)

## 2022-08-12 LAB — BASIC METABOLIC PANEL
Anion gap: 9 (ref 5–15)
BUN: 21 mg/dL (ref 8–23)
CO2: 23 mmol/L (ref 22–32)
Calcium: 9.3 mg/dL (ref 8.9–10.3)
Chloride: 112 mmol/L — ABNORMAL HIGH (ref 98–111)
Creatinine, Ser: 1.12 mg/dL (ref 0.61–1.24)
GFR, Estimated: >60 mL/min (ref >60)
Glucose, Bld: 110 mg/dL — ABNORMAL HIGH (ref 70–99)
Potassium: 4.1 mmol/L (ref 3.5–5.1)
Sodium: 144 mmol/L (ref 135–145)

## 2022-08-12 LAB — TROPONIN I (HIGH SENSITIVITY)
Troponin I (High Sensitivity): 5 ng/L (ref <18)
Troponin I (High Sensitivity): 4 ng/L (ref <18)

## 2022-08-12 LAB — CBG MONITORING, ED
Glucose-Capillary: 160 mg/dL — ABNORMAL HIGH (ref 70–99)
Glucose-Capillary: 134 mg/dL — ABNORMAL HIGH (ref 70–99)

## 2022-08-12 LAB — MAGNESIUM: Magnesium: 1.6 mg/dL — ABNORMAL LOW (ref 1.7–2.4)

## 2022-08-12 LAB — TSH: TSH: 1.865 u[IU]/mL (ref 0.350–4.500)

## 2022-08-12 MED ORDER — FOOD THICKENER (SIMPLYTHICK): 1.0000 | ORAL | Status: DC | PRN

## 2022-08-12 MED ORDER — PANTOPRAZOLE SODIUM 40 MG PO TBEC: 40.0000 mg | DELAYED_RELEASE_TABLET | Freq: Every day | ORAL | Status: DC

## 2022-08-12 MED ORDER — ACETAMINOPHEN 325 MG PO TABS
650.0000 mg | ORAL_TABLET | ORAL | Status: DC | PRN
  Administered 2022-08-13: 650 mg via ORAL
  Filled 2022-08-12: qty 2

## 2022-08-12 MED ORDER — SIMVASTATIN 20 MG PO TABS
20.0000 mg | ORAL_TABLET | Freq: Every day | ORAL | Status: DC
  Administered 2022-08-12 – 2022-08-13 (×2): 20 mg via ORAL
  Filled 2022-08-12 (×2): qty 1

## 2022-08-12 MED ORDER — PANTOPRAZOLE SODIUM 40 MG PO TBEC
40.0000 mg | DELAYED_RELEASE_TABLET | Freq: Every day | ORAL | Status: DC
  Administered 2022-08-12 – 2022-08-14 (×3): 40 mg via ORAL
  Filled 2022-08-12 (×3): qty 1

## 2022-08-12 MED ORDER — SODIUM CHLORIDE 0.9% FLUSH
3.0000 mL | Freq: Two times a day (BID) | INTRAVENOUS | Status: DC
  Administered 2022-08-12 – 2022-08-13 (×4): 3 mL via INTRAVENOUS

## 2022-08-12 MED ORDER — INSULIN ASPART 100 UNIT/ML IJ SOLN
0.0000 [IU] | Freq: Three times a day (TID) | INTRAMUSCULAR | Status: DC
  Administered 2022-08-12: 2 [IU] via SUBCUTANEOUS
  Administered 2022-08-12: 1 [IU] via SUBCUTANEOUS

## 2022-08-12 MED ORDER — SODIUM CHLORIDE 0.9 % IV SOLN: 250.0000 mL | INTRAVENOUS | Status: DC | PRN

## 2022-08-12 MED ORDER — SODIUM CHLORIDE 0.9% FLUSH: 3.0000 mL | INTRAVENOUS | Status: DC | PRN

## 2022-08-12 MED ORDER — GABAPENTIN 100 MG PO CAPS
100.0000 mg | ORAL_CAPSULE | Freq: Every day | ORAL | Status: DC
  Administered 2022-08-12 – 2022-08-13 (×2): 100 mg via ORAL
  Filled 2022-08-12 (×2): qty 1

## 2022-08-12 MED ORDER — ONDANSETRON HCL 4 MG/2ML IJ SOLN: 4.0000 mg | Freq: Four times a day (QID) | INTRAMUSCULAR | Status: DC | PRN

## 2022-08-12 NOTE — ED Provider Notes (Signed)
[facility] EMERGENCY DEPARTMENT Provider Note   CSN: [id number] Arrival date & time: 08/12/22  6222     History  Chief Complaint  Patient presents with   heart block?    Jose Chase is a 88 y.o. male.  88 year old male with past medical history significant for diabetes, prostate cancer presents today for evaluation of episode of dizziness this morning that coincided with an event on his Zio patch that was consistent with third-degree heart block.  Patient received a call from cardiology requesting he come to the emergency room for evaluation of potential pacemaker placement.  Currently he denies lightheadedness, chest pain, shortness of breath, palpitations, or dizziness.  He was recently evaluated in the emergency room and was diagnosed with Mobitz type II heart block.  He he had a Zio patch placed following the ER visit.  The history is provided by the patient. No language interpreter was used.       Home Medications Prior to Admission medications   Medication Sig Start Date End Date Taking? Authorizing Provider  ALLERGY RELIEF 10 MG tablet Take 10 mg by mouth daily. 02/20/22   [provider]  ferrous sulfate 325 (65 FE) MG tablet Take 325 mg by mouth daily with breakfast.    [provider]  gabapentin (NEURONTIN) 100 MG capsule Take 100 mg by mouth at bedtime. 04/25/22   [provider]  glipiZIDE (GLUCOTROL) 10 MG tablet Take 10 mg by mouth daily before breakfast.    [provider]  metFORMIN (GLUCOPHAGE) 500 MG tablet Take 1,000 mg by mouth 2 (two) times daily with a meal.     [provider]  Multiple Vitamins-Minerals (MEGA MULTI MEN PO) Take by mouth. Takes one tablet daily    [provider]  omeprazole (PRILOSEC) 40 MG capsule Take 1 capsule (40 mg total) by mouth daily. 07/13/22   Eloise Harman, DO  simvastatin (ZOCOR) 20 MG tablet Take 20 mg by mouth at bedtime.      [provider]       Allergies    Patient has no known allergies.    Review of Systems   Review of Systems  Constitutional:  Negative for chills and fever.  Respiratory:  Negative for shortness of breath.   Cardiovascular:  Negative for chest pain and palpitations.  Neurological:  Positive for light-headedness. Negative for syncope and weakness.  All other systems reviewed and are negative.   Physical Exam Updated Vital Signs BP 92/61 (BP Location: Right Arm)   Pulse 65   Temp 97.7 F (36.5 C) (Oral)   Resp 15   SpO2 100%  Physical Exam Vitals and nursing note reviewed.  Constitutional:      General: He is not in acute distress.    Appearance: Normal appearance. He is not ill-appearing.  HENT:     Head: Normocephalic and atraumatic.     Nose: Nose normal.  Eyes:     General: No scleral icterus.    Extraocular Movements: Extraocular movements intact.     Conjunctiva/sclera: Conjunctivae normal.  Cardiovascular:     Rate and Rhythm: Normal rate and regular rhythm.     Pulses: Normal pulses.  Pulmonary:     Effort: Pulmonary effort is normal. No respiratory distress.     Breath sounds: Normal breath sounds. No wheezing or rales.  Abdominal:     General: There is no distension.     Tenderness: There is no abdominal tenderness.  Musculoskeletal:  General: Normal range of motion.     Cervical back: Normal range of motion.     Right lower leg: No edema.     Left lower leg: No edema.  Skin:    General: Skin is warm and dry.  Neurological:     General: No focal deficit present.     Mental Status: He is alert. Mental status is at baseline.     ED Results / Procedures / Treatments   Labs (all labs ordered are listed, but only abnormal results are displayed) Labs Reviewed - No data to display  EKG None  Radiology No results found.  Procedures .Critical Care  Performed by: Evlyn Courier, PA-C Authorized by: Evlyn Courier, PA-C   Critical care provider statement:    Critical  care time (minutes):  40   Critical care was necessary to treat or prevent imminent or life-threatening deterioration of the following conditions:  Shock   Critical care was time spent personally by me on the following activities:  Development of treatment plan with patient or surrogate, discussions with consultants, evaluation of patient's response to treatment, examination of patient, ordering and review of laboratory studies, ordering and review of radiographic studies, ordering and performing treatments and interventions, pulse oximetry, re-evaluation of patient's condition and review of old charts     Medications Ordered in ED Medications - No data to display  ED Course/ Medical Decision Making/ A&P                           Medical Decision Making Amount and/or Complexity of Data Reviewed Labs: ordered. Radiology: ordered.  Risk Decision regarding hospitalization.   Medical Decision Making / ED Course   This patient presents to the ED for concern of lightheadedness, this involves an extensive number of treatment options, and is a complaint that carries with it a high risk of complications and morbidity.  The differential diagnosis includes third-degree heart block, sick sinus syndrome, ACS  MDM: 88 year old male presents after receiving a call from cardiology requesting he come in for evaluation due to [facility] patch revealing rhythm consistent with third-degree heart block.  Patient during this time also had dizziness.  Currently he is asymptomatic.  Rates of mid 29s.  Will place patient on pads.  Will obtain labs and chest x-ray. Discussed with cardiology.  They will evaluate patient for admission. Most of the work-up is still pending.  CBC shows mild anemia, without leukocytosis.   Additional history obtained: -Additional history obtained from chart review.  Telephone note from cardiology from this morning confirms patient was and complete heart block.  There are images under  media tab confirming third-degree heart block.  -External records from outside source obtained and reviewed including: Chart review including previous notes, labs, imaging, consultation notes   Lab Tests: -I ordered, reviewed, and interpreted labs.   The pertinent results include:   Labs Reviewed  CBC WITH DIFFERENTIAL/PLATELET  BASIC METABOLIC PANEL  MAGNESIUM  TROPONIN I (HIGH SENSITIVITY)      EKG  EKG Interpretation  Date/Time:    Ventricular Rate:    PR Interval:    QRS Duration:   QT Interval:    QTC Calculation:   R Axis:     Text Interpretation:           Imaging Studies ordered: I ordered imaging studies including CXR I independently visualized and interpreted imaging. I agree with the radiologist interpretation   Medicines ordered and prescription  drug management: No orders of the defined types were placed in this encounter.   -I have reviewed the patients home medicines and have made adjustments as needed  Critical interventions Patient with complete heart block.  Patient placed and observed on pacer pads in case he reverts to symptomatic bradycardia  Consultations Obtained: I requested consultation with the card allergy,  and discussed lab and imaging findings as well as pertinent plan - they recommend: Admission for further work-up for potential pacemaker placement   Cardiac Monitoring: The patient was maintained on a cardiac monitor.  I personally viewed and interpreted the cardiac monitored which showed an underlying rhythm of: Normal sinus rhythm to sinus bradycardia  Reevaluation: After the interventions noted above, I reevaluated the patient and found that they have :stayed the same  Co morbidities that complicate the patient evaluation  Past Medical History:  Diagnosis Date   Aspirin long-term use    DM (diabetes mellitus) ([place])    Dyspnea    GERD (gastroesophageal reflux disease)    Hemorrhoids, internal 10/13/2007   Hiatal hernia  DEC 2008 LARGE   Prostate cancer [facility])       Dispostion: Patient will be admitted to cardiology for further work-up and management of symptomatic bradycardia and complete heart block..  Final Clinical Impression(s) / ED Diagnoses Final diagnoses:  Complete heart block ([place])  Symptomatic bradycardia    Rx / DC Orders ED Discharge Orders     None         Evlyn Courier, PA-C 08/12/22 [place], DO 08/12/22 1133

## 2022-08-12 NOTE — Plan of Care (Signed)
    Problem: Coping: Goal: Ability to adjust to condition or change in health will improve Outcome: Progressing   Problem: Education: Goal: Knowledge of General Education information will improve Description: Including pain rating scale, medication(s)/side effects and non-pharmacologic comfort measures Outcome: Progressing   

## 2022-08-12 NOTE — Telephone Encounter (Addendum)
   Received notification from [place] this morning that this patient with live event heart monitor had an episode of high-grade AV block around 5:59 am this morning.  I called patient this morning and spoke with his spouse, Vernell (emergency contact) and notified them of this event.  She reports that around the time of patient's monitored bradycardic event, he was feeling dizzy. Patient is now without symptoms including dizziness/lightheadedness and shortness of breath.  I personally reviewed the ZIO report this morning which appears to show progression to complete heart block with heart rates in the mid 30s this morning (images in media tab).  Advised patient and spouse that they need to proceed to [facility] emergency department as soon as possible this morning.  Patient very likely to need pacemaker. He is aware that he is not able to drive himself.  Patient and spouse confirmed understanding of this plan.    Lily Kocher, PA-C

## 2022-08-12 NOTE — H&P (Signed)
Cardiology Admission History and Physical   Patient ID: Jose Chase MRN: [id number]; DOB: Oct 17, 1932   Admission date: 08/12/2022  PCP:  Carrolyn Meiers, [place] Providers Cardiologist:  None        Chief Complaint:  symptomatic bradycardia  Patient Profile:   Jose Chase is a 88 y.o. male with medical history of diabetes mellitus type 2, esophageal stricture, gastritis, prostate cancer who is being seen 08/12/2022 for the evaluation of symptomatic bradycardia and heart block.  History of Present Illness:   Mr. Sweda presented to the ED today at the advice of cardiology after his Zio event monitor detected high degree AV block, possible complete heart block earlier this morning. Patient was scheduled for an upper endoscopy on 9/26 for management of esophageal stenosis but was observed to have a first degree AV block while in a pre-procedure area and was sent to the ED. While in the ED, he had multiple ECGs which showed both Mobitz I and Mobitz II. Patient was reportedly without symptoms though orthostatic vital signs appear to have been positive. Decision was made to send patient home with a live telemetry monitor and scheduled outpatient cardiology follow up. In the ED, patient denies any symptoms including dizziness. He does report intermittent dizziness for several years, usually associated with positional changes but occasionally when sitting or laying down.   On 9/28, Zio contacted cardiology after recurrent Mobitz I and suspected Mobitz type II occurred. Patient reported symptoms of dizziness and hiccups at the time of this event. Given lack of recurrent symptoms, decision made to continue monitoring.   On the morning of 10/1, Zio again reached out about a concerning heart block pattern. This report was reviewed and showed Mobitz II with a short period of complete heart block noted, HR in the 30s. Patient was contacted and reported symptoms of  dizziness and hiccups. He was advised to report to [facility] ED for further management, likely pacemaker.  Past Medical History:  Diagnosis Date   Aspirin long-term use    DM (diabetes mellitus) ([place])    Dyspnea    GERD (gastroesophageal reflux disease)    Hemorrhoids, internal 10/13/2007   Hiatal hernia DEC 2008 LARGE   Prostate cancer [facility])     Past Surgical History:  Procedure Laterality Date   BACK SURGERY     COLONOSCOPY  DEC 7322   COMPLICATED BY CONTAINED RECTAL PERFORATION   ESOPHAGOGASTRODUODENOSCOPY  05/29/2011   Procedure: ESOPHAGOGASTRODUODENOSCOPY (EGD);  Surgeon: Dorothyann Peng, MD;  [facility];  Service: Endoscopy;  Laterality: N/A;  Possible ED   ESOPHAGOGASTRODUODENOSCOPY N/A 02/07/2016   Procedure: ESOPHAGOGASTRODUODENOSCOPY (EGD);  Surgeon: Danie Binder, MD;  [facility];  Service: Endoscopy;  Laterality: N/A;  200 - moved to 3/28 @ 10:30 - office notified pt   ESOPHAGOGASTRODUODENOSCOPY (EGD) WITH ESOPHAGEAL DILATION N/A 10/20/2013   [id number] at the gastroesophageal junction/ Medium sized hiatal hernia/MILD Erosive gastritis. Biopsy showed chronic inflammation, no H. pylori, fragment of fundic gland polyp   MALONEY DILATION  05/29/2011   Procedure: MALONEY DILATION;  Surgeon: Dorothyann Peng, MD;  [facility];  Service: Endoscopy;  Laterality: N/A;   PROSTATE SURGERY     prostate cancer   SAVORY DILATION  05/29/2011   Procedure: SAVORY DILATION;  Surgeon: Dorothyann Peng, MD;  [facility];  Service: Endoscopy;  Laterality: N/A;   SAVORY DILATION N/A 02/07/2016   Procedure: SAVORY DILATION;  Surgeon:  Danie Binder, MD;  [facility];  Service: Endoscopy;  Laterality: N/A;   UPPER GASTROINTESTINAL ENDOSCOPY  DEC 2008   VH:QIONGE GASTRIC MUCOSA     Medications Prior to Admission: Prior to Admission medications   Medication Sig Start Date End Date Taking? Authorizing Provider  ALLERGY RELIEF 10 MG tablet  Take 10 mg by mouth daily. 02/20/22   [provider]  ferrous sulfate 325 (65 FE) MG tablet Take 325 mg by mouth daily with breakfast.    [provider]  gabapentin (NEURONTIN) 100 MG capsule Take 100 mg by mouth at bedtime. 04/25/22   [provider]  glipiZIDE (GLUCOTROL) 10 MG tablet Take 10 mg by mouth daily before breakfast.    [provider]  metFORMIN (GLUCOPHAGE) 500 MG tablet Take 1,000 mg by mouth 2 (two) times daily with a meal.     [provider]  Multiple Vitamins-Minerals (MEGA MULTI MEN PO) Take by mouth. Takes one tablet daily    [provider]  omeprazole (PRILOSEC) 40 MG capsule Take 1 capsule (40 mg total) by mouth daily. 07/13/22   Eloise Harman, DO  simvastatin (ZOCOR) 20 MG tablet Take 20 mg by mouth at bedtime.      [provider]     Allergies:   No Known Allergies  Social History:   Social History   Socioeconomic History   Marital status: Married    Spouse name: Not on file   Number of children: Not on file   Years of education: Not on file   Highest education level: Not on file  Occupational History   Not on file  Tobacco Use   Smoking status: Former    Types: Cigarettes    Quit date: 01/11/1973    Years since quitting: 49.6   Smokeless tobacco: Never   Tobacco comments:    Never smoked very much. Occasional smoker-once or twice a week. Quit in 1974.  Vaping Use   Vaping Use: Never used  Substance and Sexual Activity   Alcohol use: No    Alcohol/week: 0.0 standard drinks of alcohol   Drug use: No   Sexual activity: Not Currently  Other Topics Concern   Not on file  Social History Narrative   Not on file   Social Determinants of Health   Financial Resource Strain: Not on file  Food Insecurity: Not on file  Transportation Needs: Not on file  Physical Activity: Not on file  Stress: Not on file  Social Connections: Not on file  Intimate Partner Violence: Not on file    Family  History:   The patient's family history includes Cancer in his brother, brother, sister, and another family member; Heart disease in his brother and father. There is no history of Colon cancer or Colon polyps.    ROS:  Please see the history of present illness.  All other ROS reviewed and negative.     Physical Exam/Data:   Vitals:   08/12/22 0915 08/12/22 0937 08/12/22 0945  BP: 92/61  119/69  Pulse: 65  (!) 54  Resp: 15  14  Temp: 97.7 F (36.5 C)    TempSrc: Oral    SpO2: 100%  97%  Weight:  65.8 kg   Height:  6' (1.829 m)    No intake or output data in the 24 hours ending 08/12/22 0950    08/12/2022    9:37 AM 08/07/2022   11:39 AM 08/06/2022   11:17 AM  Last 3 Weights  Weight (lbs) 145 lb 145 lb 152 lb 5.4 oz  Weight (kg) 65.772 kg 65.772 kg 69.1 kg     Body mass index is 19.67 kg/m.  General:  Well nourished, well developed, in no acute distress HEENT: normal Neck: no JVD Vascular: No carotid bruits; Distal pulses 2+ bilaterally   Cardiac:  Bradycardic. normal S1, S2; RRR; no murmur  Lungs:  clear to auscultation bilaterally, no wheezing, rhonchi or rales  Abd: soft, nontender, no hepatomegaly  Ext: no edema Musculoskeletal:  No deformities, BUE and BLE strength normal and equal Skin: warm and dry  Neuro:  CNs 2-12 intact, no focal abnormalities noted Psych:  Normal affect    EKG:  The ECG that was done 10/1 was personally reviewed and demonstrates first degree AV block, very long PR of 349m.  Tele: Mobitz type II AV block  Relevant CV Studies:  Zio live monitor 08/12/22    Laboratory Data:  High Sensitivity Troponin:  No results for input(s): "TROPONINIHS" in the last 720 hours.    Chemistry Recent Labs  Lab 08/06/22 1118 08/07/22 1139  NA 140 140  K 4.2 4.0  CL 110 108  CO2 23 23  GLUCOSE 96 74  BUN 17 15  CREATININE 1.05 1.03  CALCIUM 8.7* 8.6*  GFRNONAA >60 >60  ANIONGAP 7 9    Recent Labs  Lab 08/07/22 1139  PROT 6.9  ALBUMIN  3.4*  AST 22  ALT 12  ALKPHOS 60  BILITOT 1.0   Lipids No results for input(s): "CHOL", "TRIG", "HDL", "LABVLDL", "LDLCALC", "CHOLHDL" in the last 168 hours. Hematology Recent Labs  Lab 08/06/22 1118 08/07/22 1139  WBC 6.2 5.2  RBC 5.45 5.22  HGB 11.5* 11.0*  HCT 38.7* 36.9*  MCV 71.0* 70.7*  MCH 21.1* 21.1*  MCHC 29.7* 29.8*  RDW 22.4* 22.1*  PLT 428* 333   Thyroid No results for input(s): "TSH", "FREET4" in the last 168 hours. BNPNo results for input(s): "BNP", "PROBNP" in the last 168 hours.  DDimer No results for input(s): "DDIMER" in the last 168 hours.   Radiology/Studies:  No results found.   Assessment and Plan:   Symptomatic bradycardia with transient complete heart block  Patient with recently observed first-degree AV block, now noted with transient complete heart block on live monitor this morning. He remains generally asymptomatic but does endorse brief periods of dizziness.  Given progressive heart block, patient will need pacemaker placement.  Avoid all AV nodal blocking agents  DM type II  Sliding scale insulin ordered. Continue Simvastatin '20mg'$ . Continue nightly gabapentin.  Hx gastritis with esophageal stricture  Continue home Omeprazole '40mg'$  daily (replaced with formulary alternative).   Risk Assessment/Risk Scores:    Severity of Illness: The appropriate patient status for this patient is INPATIENT. Inpatient status is judged to be reasonable and necessary in order to provide the required intensity of service to ensure the patient's safety. The patient's presenting symptoms, physical exam findings, and initial radiographic and laboratory data in the context of their chronic comorbidities is felt to place them at high risk for further clinical deterioration. Furthermore, it is not anticipated that the patient will be medically stable for discharge from the hospital within 2 midnights of admission.   * I certify that at the point of admission it  is my clinical judgment that the patient will require inpatient hospital care spanning beyond 2 midnights from the point of admission due to high intensity of service, high risk for  further deterioration and high frequency of surveillance required.*   For questions or updates, please contact [place] Please consult [email or web address] for contact info under     Signed, Lily Kocher, PA-C  08/12/2022 9:50 AM

## 2022-08-12 NOTE — ED Notes (Signed)
Cardiac pads applied

## 2022-08-12 NOTE — ED Triage Notes (Signed)
Patient states that he was called by his MD and instructed to come to ED. Has A.fib and hx of diabetes, patient states that he has no idea why he was sent here-no complaints

## 2022-08-13 ENCOUNTER — Telehealth: Payer: Self-pay | Admitting: Cardiology

## 2022-08-13 ENCOUNTER — Inpatient Hospital Stay (HOSPITAL_COMMUNITY)
Admission: EM | Disposition: A | Discharge status: Home / Self Care | Payer: Self-pay | Source: Home / Self Care | Attending: Cardiovascular Disease

## 2022-08-13 ENCOUNTER — Inpatient Hospital Stay (HOSPITAL_COMMUNITY): Payer: Medicare Other

## 2022-08-13 ENCOUNTER — Ambulatory Visit (HOSPITAL_COMMUNITY): Admit: 2022-08-13 | Payer: Medicare Other | Admitting: Cardiology

## 2022-08-13 DIAGNOSIS — I442 Atrioventricular block, complete: Secondary | ICD-10-CM | POA: Diagnosis not present

## 2022-08-13 HISTORY — PX: PACEMAKER IMPLANT: EP1218

## 2022-08-13 LAB — SURGICAL PCR SCREEN
MRSA, PCR: NEGATIVE
Staphylococcus aureus: NEGATIVE

## 2022-08-13 LAB — ECHOCARDIOGRAM COMPLETE
Area-P 1/2: 3.31 cm2
Calc EF: 58.7 %
Height: 72 in
Single Plane A2C EF: 59.2 %
Single Plane A4C EF: 52.6 %
Weight: 2377.44 oz

## 2022-08-13 LAB — GLUCOSE, CAPILLARY
Glucose-Capillary: 101 mg/dL — ABNORMAL HIGH (ref 70–99)
Glucose-Capillary: 82 mg/dL (ref 70–99)
Glucose-Capillary: 69 mg/dL — ABNORMAL LOW (ref 70–99)
Glucose-Capillary: 193 mg/dL — ABNORMAL HIGH (ref 70–99)

## 2022-08-13 SURGERY — PACEMAKER IMPLANT
Anesthesia: LOCAL

## 2022-08-13 MED ORDER — MIDAZOLAM HCL 5 MG/5ML IJ SOLN
INTRAMUSCULAR | Status: AC
  Filled 2022-08-13: qty 5

## 2022-08-13 MED ORDER — HEPARIN (PORCINE) IN NACL 1000-0.9 UT/500ML-% IV SOLN
INTRAVENOUS | Status: AC
  Filled 2022-08-13: qty 500

## 2022-08-13 MED ORDER — HEPARIN (PORCINE) IN NACL 1000-0.9 UT/500ML-% IV SOLN
INTRAVENOUS | Status: DC | PRN
  Administered 2022-08-13: 500 mL

## 2022-08-13 MED ORDER — LIDOCAINE HCL (PF) 1 % IJ SOLN
INTRAMUSCULAR | Status: DC | PRN
  Administered 2022-08-13: 60 mL

## 2022-08-13 MED ORDER — SODIUM CHLORIDE 0.9 % IV SOLN
80.0000 mg | INTRAVENOUS | Status: AC
  Administered 2022-08-13: 80 mg

## 2022-08-13 MED ORDER — SODIUM CHLORIDE 0.9 % IV SOLN: INTRAVENOUS | Status: DC

## 2022-08-13 MED ORDER — CEFAZOLIN SODIUM-DEXTROSE 2-4 GM/100ML-% IV SOLN
INTRAVENOUS | Status: AC
  Filled 2022-08-13: qty 100

## 2022-08-13 MED ORDER — LACTATED RINGERS IV SOLN: INTRAVENOUS | Status: DC

## 2022-08-13 MED ORDER — FENTANYL CITRATE (PF) 100 MCG/2ML IJ SOLN
INTRAMUSCULAR | Status: AC
  Filled 2022-08-13: qty 2

## 2022-08-13 MED ORDER — CEFAZOLIN SODIUM-DEXTROSE 2-4 GM/100ML-% IV SOLN
2.0000 g | INTRAVENOUS | Status: AC
  Administered 2022-08-13: 2 g via INTRAVENOUS

## 2022-08-13 MED ORDER — LIDOCAINE HCL (PF) 1 % IJ SOLN
INTRAMUSCULAR | Status: AC
  Filled 2022-08-13: qty 60

## 2022-08-13 MED ORDER — SODIUM CHLORIDE 0.9 % IV SOLN
INTRAVENOUS | Status: AC
  Filled 2022-08-13: qty 2

## 2022-08-13 SURGICAL SUPPLY — 15 items
CABLE SURGICAL S-101-97-12 (CABLE) ×1 IMPLANT
CATH CPS LOCATOR 3D MED (CATHETERS) IMPLANT
CPS IMPLANT KIT 410190 (MISCELLANEOUS) IMPLANT
HELIX LOCKING TOOL (MISCELLANEOUS) ×1
LEAD ULTIPACE 52 LPA1231/52 (Lead) IMPLANT
LEAD ULTIPACE 65 LPA1231/65 (Lead) IMPLANT
PACEMAKER ASSURITY DR-RF (Pacemaker) IMPLANT
PAD DEFIB RADIO PHYSIO CONN (PAD) ×1 IMPLANT
SHEATH 7FR PRELUDE SNAP 13 (SHEATH) IMPLANT
SHEATH 9FR PRELUDE SNAP 13 (SHEATH) IMPLANT
SHEATH PROBE COVER 6X72 (BAG) IMPLANT
SLITTER AGILIS HISPRO (INSTRUMENTS) IMPLANT
TOOL HELIX LOCKING (MISCELLANEOUS) IMPLANT
TRAY PACEMAKER INSERTION (PACKS) ×1 IMPLANT
WIRE HI TORQ VERSACORE-J 145CM (WIRE) IMPLANT

## 2022-08-13 NOTE — Progress Notes (Signed)
ELECTROPHYSIOLOGY CONSULT NOTE    Patient ID: Jose Chase MRN: [id number], DOB/AGE: 15-Feb-1932 88 y.o.  Admit date: 08/12/2022 Date of Consult: 08/13/2022  Primary Physician: Carrolyn Meiers, MD Primary Cardiologist: None  Electrophysiologist: none    Patient Profile: Jose Chase is a 88 y.o. male with a history of diabetes mellitus type 2, esophageal stricture, gastritis, prostate cancer who is being seen 08/12/2022 for the evaluation of symptomatic bradycardia and heart block.    HPI:  Jose Chase is a 88 y.o. male who presented to the ED 10/1 at the advice of cardiology after his Zio event monitor detected high degree AV block, possible complete heart block earlier this morning. Patient was scheduled for an upper endoscopy on 9/26 for management of esophageal stenosis but was observed to have a first degree AV block while in a pre-procedure area and was sent to the ED. While in the ED, he had multiple ECGs which showed both Mobitz I and Mobitz II. Patient was reportedly without symptoms though orthostatic vital signs appear to have been positive. Decision was made to send patient home with a live telemetry monitor and scheduled outpatient EP follow up. In the ED, patient denies any symptoms including dizziness. He does report intermittent dizziness for several years, usually associated with positional changes but occasionally when sitting or laying down.    On 9/28, Zio contacted cardiology after recurrent Mobitz I and suspected Mobitz type II occurred. Patient reported symptoms of dizziness and hiccups at the time of this event. Given lack of recurrent symptoms, decision made to continue monitoring.    On the morning of 10/1, Zio again reached out about a concerning heart block pattern. This report was reviewed and showed Mobitz II with a short period of complete heart block noted, HR in the 30s. Patient was contacted and reported symptoms of dizziness and hiccups. He was  advised to report to [facility] ED for further management, likely pacemaker.  Potassium4.1 (10/01 6073) Magnesium  1.6* (10/01 7106) Creatinine, ser  1.12 (10/01 0958) PLT  360 (10/01 0958) HGB  11.6* (10/01 0958) WBC 5.5 (10/01 0958) Troponin I (High Sensitivity)4 (10/01 1125).    He denies chest pain, palpitations, dyspnea, PND, orthopnea, nausea, vomiting, edema, weight gain, or early satiety.    Past Medical History:  Diagnosis Date   Aspirin long-term use    DM (diabetes mellitus) ([place])    Dyspnea    GERD (gastroesophageal reflux disease)    Hemorrhoids, internal 10/13/2007   Hiatal hernia DEC 2008 LARGE   Prostate cancer [facility])      Surgical History:  Past Surgical History:  Procedure Laterality Date   BACK SURGERY     COLONOSCOPY  DEC 2694   COMPLICATED BY CONTAINED RECTAL PERFORATION   ESOPHAGOGASTRODUODENOSCOPY  05/29/2011   Procedure: ESOPHAGOGASTRODUODENOSCOPY (EGD);  Surgeon: Dorothyann Peng, MD;  [facility];  Service: Endoscopy;  Laterality: N/A;  Possible ED   ESOPHAGOGASTRODUODENOSCOPY N/A 02/07/2016   Procedure: ESOPHAGOGASTRODUODENOSCOPY (EGD);  Surgeon: Danie Binder, MD;  [facility];  Service: Endoscopy;  Laterality: N/A;  200 - moved to 3/28 @ 10:30 - office notified pt   ESOPHAGOGASTRODUODENOSCOPY (EGD) WITH ESOPHAGEAL DILATION N/A 10/20/2013   [id number] at the gastroesophageal junction/ Medium sized hiatal hernia/MILD Erosive gastritis. Biopsy showed chronic inflammation, no H. pylori, fragment of fundic gland polyp   MALONEY DILATION  05/29/2011   Procedure: MALONEY DILATION;  Surgeon: Dorothyann Peng, MD;  [facility];  Service: Endoscopy;  Laterality: N/A;   PROSTATE SURGERY     prostate cancer   SAVORY DILATION  05/29/2011   Procedure: SAVORY DILATION;  Surgeon: Dorothyann Peng, MD;  [facility];  Service: Endoscopy;  Laterality: N/A;   SAVORY DILATION N/A 02/07/2016   Procedure: SAVORY DILATION;  Surgeon:  Danie Binder, MD;  [facility];  Service: Endoscopy;  Laterality: N/A;   UPPER GASTROINTESTINAL ENDOSCOPY  DEC 2008   ZO:[id number] GASTRIC MUCOSA     Medications Prior to Admission  Medication Sig Dispense Refill Last Dose   acetaminophen (TYLENOL) 500 MG tablet Take 500 mg by mouth every 6 (six) hours as needed for mild pain or moderate pain.   Past Week   ALLERGY RELIEF 10 MG tablet Take 10 mg by mouth daily.   08/11/2022   ferrous sulfate 325 (65 FE) MG tablet Take 325 mg by mouth daily with breakfast.   07/30/2022   gabapentin (NEURONTIN) 100 MG capsule Take 100 mg by mouth at bedtime.   08/11/2022   glipiZIDE (GLUCOTROL) 10 MG tablet Take 10 mg by mouth daily before breakfast.   08/11/2022   metFORMIN (GLUCOPHAGE) 850 MG tablet Take 850 mg by mouth 2 (two) times daily.   08/11/2022   Multiple Vitamins-Minerals (MEGA MULTI MEN PO) Take 1 tablet by mouth daily.   08/11/2022   omeprazole (PRILOSEC) 40 MG capsule Take 1 capsule (40 mg total) by mouth daily. 90 capsule 3 08/11/2022   simvastatin (ZOCOR) 20 MG tablet Take 20 mg by mouth at bedtime.     08/11/2022    Inpatient Medications:   gabapentin  100 mg Oral QHS   gentamicin (GARAMYCIN) 80 mg in sodium chloride 0.9 % 500 mL irrigation  80 mg Irrigation On Call   insulin aspart  0-9 Units Subcutaneous TID WC   pantoprazole  40 mg Oral Daily   simvastatin  20 mg Oral QHS   sodium chloride flush  3 mL Intravenous Q12H    Allergies: No Known Allergies  Social History   Socioeconomic History   Marital status: Married    Spouse name: Not on file   Number of children: Not on file   Years of education: Not on file   Highest education level: Not on file  Occupational History   Not on file  Tobacco Use   Smoking status: Former    Types: Cigarettes    Quit date: 01/11/1973    Years since quitting: 49.6   Smokeless tobacco: Never   Tobacco comments:    Never smoked very much. Occasional smoker-once or twice a week. Quit in  1974.  Vaping Use   Vaping Use: Never used  Substance and Sexual Activity   Alcohol use: No    Alcohol/week: 0.0 standard drinks of alcohol   Drug use: No   Sexual activity: Not Currently  Other Topics Concern   Not on file  Social History Narrative   Not on file   Social Determinants of Health   Financial Resource Strain: Not on file  Food Insecurity: Not on file  Transportation Needs: Not on file  Physical Activity: Not on file  Stress: Not on file  Social Connections: Not on file  Intimate Partner Violence: Not on file     Family History  Problem Relation Age of Onset   Heart disease Father    Heart disease Brother    Cancer Brother    Cancer Brother    Cancer Other  Cancer Sister    Colon cancer Neg Hx    Colon polyps Neg Hx      Review of Systems: All other systems reviewed and are otherwise negative except as noted above.  Physical Exam: Vitals:   08/12/22 1509 08/12/22 1935 08/13/22 0035 08/13/22 0605  BP: (!) 144/76 (!) 153/90 (!) 164/98 119/75  Pulse: 77 (!) 58 66 (!) 51  Resp: '13 20 18 13  '$ Temp:  97.9 F (36.6 C) 97.6 F (36.4 C) (!) 97.1 F (36.2 C)  TempSrc:  Oral Oral Oral  SpO2: 99% 100% 98% 98%  Weight:   67.4 kg   Height:        GEN- thin, lying in bed; alert and oriented x 3 today.   HEENT: edentulous, mumbled speech, otherwise benign Lungs- Clear to ausculation bilaterally, normal work of breathing.  No wheezes, rales, rhonchi Heart- slow rate and rhythm, no murmurs, rubs or gallops GI- soft, non-tender, non-distended, bowel sounds present Extremities- no clubbing, cyanosis, or edema; DP/PT/radial pulses 2+ bilaterally MS- no significant deformity or atrophy Skin- warm and dry, no rash or lesion Psych- euthymic mood, full affect Neuro- strength and sensation are intact  Labs:   Lab Results  Component Value Date   WBC 5.5 08/12/2022   HGB 11.6 (L) 08/12/2022   HCT 39.3 08/12/2022   MCV 71.6 (L) 08/12/2022   PLT 360  08/12/2022    Recent Labs  Lab 08/07/22 1139 08/12/22 0958  NA 140 144  K 4.0 4.1  CL 108 112*  CO2 23 23  BUN 15 21  CREATININE 1.03 1.12  CALCIUM 8.6* 9.3  PROT 6.9  --   BILITOT 1.0  --   ALKPHOS 60  --   ALT 12  --   AST 22  --   GLUCOSE 74 110*      Radiology/Studies: DG Chest Portable 1 View  Result Date: 08/12/2022 CLINICAL DATA:  Shortness of breath EXAM: PORTABLE CHEST 1 VIEW COMPARISON:  08/07/2022 FINDINGS: Transverse diameter of heart is slightly increased. The soft tissue fullness in the retrocardiac region suggesting possible fixed hiatal hernia. There are no signs of pulmonary edema or focal pulmonary consolidation. There is no pleural effusion or pneumothorax. Degenerative changes are noted in right shoulder. Possible chronic tear of rotator cuff in right shoulder. IMPRESSION: There are no new infiltrates or signs of pulmonary edema. Electronically Signed   By: Elmer Picker M.D.   On: 08/12/2022 10:04   DG Chest Port 1 View  Result Date: 08/07/2022 CLINICAL DATA:  Bradycardia. EXAM: PORTABLE CHEST 1 VIEW COMPARISON:  10/17/2007 FINDINGS: Heart size and mediastinal contours appear normal. Aortic atherosclerotic calcifications. No signs of pleural effusion or edema. No airspace opacities. The visualized osseous structures appear grossly intact. IMPRESSION: No active cardiopulmonary abnormalities. Electronically Signed   By: Kerby Moors M.D.   On: 08/07/2022 12:11    EKG: 10/2 intermittent complete heart block (personally reviewed)  TELEMETRY: Mobitz type 2 AV block with junctional escape rhythm after missed beats (personally reviewed)    Assessment/Plan: 1.  Symptomatic bradycardia with transient complete heart block Patient with recently observed first-degree AV block, now noted with transient complete heart block on live monitor this morning. He remains generally asymptomatic but does endorse brief periods of dizziness.  - Given progressive, symptomatic  heart block, patient meets criteria for pacemaker placement.  - updated TTE  - NPO for PM placement today - Avoid all AV nodal blocking agents   2. DM type II  Sliding scale insulin ordered. Continue Simvastatin '20mg'$ . Continue nightly gabapentin.   3. Hx gastritis with esophageal stricture  Continue home Omeprazole '40mg'$  daily (replaced with formulary alternative).      For questions or updates, please contact [place] Please consult [email or web address] for contact info under Cardiology/STEMI.  Signed, Mamie Levers, NP  08/13/2022 8:27 AM

## 2022-08-13 NOTE — TOC Progression Note (Signed)
Transition of Care [facility]) - Progression Note    Patient Details  Name: Jose Chase MRN: [id number] Date of Birth: [birth year]  Transition of Care [facility] CM/SW Contact  Zenon Mayo, RN Phone Number: 08/13/2022, 9:40 AM  Clinical Narrative:    from [facility]  [facility], had zio patch on, Dr. Durene Cal patient to ED, he is going for pacemaker after lunch, he is indep.  TOC following.        Expected Discharge Plan and Services                                                 Social Determinants of Health (SDOH) Interventions    Readmission Risk Interventions     No data to display

## 2022-08-13 NOTE — Telephone Encounter (Signed)
Pamala Hurry with [employer] calling with Zio monitor results. Line disconnected before a nurse was reached.

## 2022-08-13 NOTE — Telephone Encounter (Signed)
Spoke with [employer]. Pt wearing a Zio monitor and at 8:11 am was noted to have 3 rd degree AV block, HR 33-34 BPM for 11.2 sec. [employer] was unable to reach pt. Nurse called patient with no answer. Left msg to call back. Pt is currently admitted. Will FYI Dr. Domenic Polite.

## 2022-08-14 ENCOUNTER — Encounter (HOSPITAL_COMMUNITY): Payer: Self-pay | Admitting: Cardiology

## 2022-08-14 ENCOUNTER — Inpatient Hospital Stay (HOSPITAL_COMMUNITY): Payer: Medicare Other

## 2022-08-14 DIAGNOSIS — I442 Atrioventricular block, complete: Secondary | ICD-10-CM | POA: Diagnosis not present

## 2022-08-14 LAB — GLUCOSE, CAPILLARY: Glucose-Capillary: 113 mg/dL — ABNORMAL HIGH (ref 70–99)

## 2022-08-14 MED ORDER — METFORMIN HCL 850 MG PO TABS: 850.0000 mg | ORAL_TABLET | Freq: Two times a day (BID) | ORAL | 11 refills | Status: AC

## 2022-08-14 MED FILL — Midazolam HCl Inj 5 MG/5ML (Base Equivalent): INTRAMUSCULAR | Qty: 5 | Status: AC

## 2022-08-14 MED FILL — Fentanyl Citrate Preservative Free (PF) Inj 100 MCG/2ML: INTRAMUSCULAR | Qty: 2 | Status: AC

## 2022-08-14 NOTE — TOC Transition Note (Signed)
Transition of Care [facility]) - CM/SW Discharge Note   Patient Details  Name: Jose Chase MRN: [id number] Date of Birth: 02-07-1932  Transition of Care [facility]) CM/SW Contact:  Zenon Mayo, RN Phone Number: 08/14/2022, 10:03 AM   Clinical Narrative:    Patient is for dc today, has no needs.          Patient Goals and CMS Choice        Discharge Placement                       Discharge Plan and Services                                     Social Determinants of Health (SDOH) Interventions     Readmission Risk Interventions     No data to display

## 2022-08-14 NOTE — Discharge Summary (Signed)
ELECTROPHYSIOLOGY PROCEDURE DISCHARGE SUMMARY    Patient ID: Jose Chase,  MRN: [id number], DOB/AGE: 06/09/1932 88 y.o.  Admit date: 08/12/2022 Discharge date: 08/14/2022  Primary Care Physician: Carrolyn Meiers, MD  Primary Cardiologist: None  Electrophysiologist: new to Dr. Quentin Chase   Primary Discharge Diagnosis:  Symptomatic bradycardia status post pacemaker implantation this admission  Secondary Discharge Diagnosis:  none  No Known Allergies   Procedures This Admission:  1.  Implantation of a Abbott Dual Chamber PPM on 08/13/2022 by Dr. Quentin Chase. The patient received a Abbott Assurity [id number] with a Abbott Ultipace 1231-52 right atrial lead and a Abbott Ultipace 1231-65 right ventricular lead.  There were no immediate post procedure complications.   2.  CXR on 08/14/2022 demonstrated no pneumothorax status post device implantation.    Brief HPI: Jose Chase is a 88 y.o. male was admitted for symptomatic bradycardia and heart block and electrophysiology team asked to see for consideration of PPM implantation.  Past medical history includes DM type 2, esophageal stricture, gastritis, prostate cancer.  The patient has had symptomatic bradycardia without reversible causes identified.  Risks, benefits, and alternatives to PPM implantation were reviewed with the patient who wished to proceed.   Hospital Course:  The patient was admitted and underwent implantation of a  Abbott  dual chamber PPM with details as outlined above.  He was monitored on telemetry overnight which demonstrated appropriate pacing.  Left chest was without hematoma or ecchymosis.  The device was interrogated and found to be functioning normally.  CXR was obtained and demonstrated no pneumothorax status post device implantation.  Wound care, arm mobility, and restrictions were reviewed with the patient and his family.  The patient was examined and considered stable for discharge to home.     Anticoagulation resumption This patient is not on anticoagulation     Physical Exam: Vitals:   08/13/22 2309 08/14/22 0315 08/14/22 0517 08/14/22 0721  BP: (!) 163/96 129/72  (!) 146/87  Pulse: 71 63  66  Resp: '20 14  16  '$ Temp: 97.7 F (36.5 C) 98.6 F (37 C)  98.1 F (36.7 C)  TempSrc: Oral Oral  Oral  SpO2: 100% 99%    Weight:   64.5 kg   Height:        GEN- The patient is frail appearing, alert and oriented x 3 today HEENT: normocephalic, atraumatic; sclera clear, conjunctiva pink; hearing intact; oropharynx clear; neck supple, no JVP, temporal wasting present Lymph- no cervical lymphadenopathy Lungs- Clear to ausculation bilaterally, normal work of breathing.  No wheezes, rales, rhonchi Heart- Regular rate and rhythm, no murmurs, rubs or gallops, PMI not laterally displaced GI- soft, non-tender, non-distended, bowel sounds present, no hepatosplenomegaly Extremities- no clubbing, cyanosis, or edema; DP/PT/radial pulses 2+ bilaterally MS- no significant deformity or atrophy Skin- warm and dry, no rash or lesion, left chest without hematoma/ecchymosis Psych- euthymic mood, full affect Neuro- strength and sensation are intact, forgetful   Labs:   Lab Results  Component Value Date   WBC 5.5 08/12/2022   HGB 11.6 (L) 08/12/2022   HCT 39.3 08/12/2022   MCV 71.6 (L) 08/12/2022   PLT 360 08/12/2022    Recent Labs  Lab 08/07/22 1139 08/12/22 0958  NA 140 144  K 4.0 4.1  CL 108 112*  CO2 23 23  BUN 15 21  CREATININE 1.03 1.12  CALCIUM 8.6* 9.3  PROT 6.9  --   BILITOT 1.0  --   ALKPHOS 60  --  ALT 12  --   AST 22  --   GLUCOSE 74 110*    Discharge Medications:  Allergies as of 08/14/2022   No Known Allergies      Medication List     TAKE these medications    acetaminophen 500 MG tablet Commonly known as: TYLENOL Take 500 mg by mouth every 6 (six) hours as needed for mild pain or moderate pain.   Allergy Relief 10 MG tablet Generic drug:  loratadine Take 10 mg by mouth daily.   ferrous sulfate 325 (65 FE) MG tablet Take 325 mg by mouth daily with breakfast.   gabapentin 100 MG capsule Commonly known as: NEURONTIN Take 100 mg by mouth at bedtime.   glipiZIDE 10 MG tablet Commonly known as: GLUCOTROL Take 10 mg by mouth daily before breakfast.   MEGA MULTI MEN PO Take 1 tablet by mouth daily.   metFORMIN 850 MG tablet Commonly known as: GLUCOPHAGE Take 1 tablet (850 mg total) by mouth 2 (two) times daily. Start taking on: August 15, 2022   omeprazole 40 MG capsule Commonly known as: PRILOSEC Take 1 capsule (40 mg total) by mouth daily.   simvastatin 20 MG tablet Commonly known as: ZOCOR Take 20 mg by mouth at bedtime.        Disposition:    Follow-up [facility]. Go to.   Specialty: Cardiology Why: 10/17 at 1:55pm Contact information: [address] [place] [id number] [place] [phone]                Duration of Discharge Encounter: Greater than 30 minutes including physician time.  Signed, Mamie Levers, NP  08/14/2022 9:32 AM

## 2022-08-14 NOTE — Progress Notes (Signed)
Pt noted with sling off his left arm and states he wants to know "who was that guy?", explaining the one that took him out of here.   Pt noted to have been taken down stairs for an xray and now expresses confusion about where he is stating he is in a different room. Pt re-oriented but now isn't sure if this is right.  He is cooperative & compliant at this time.

## 2022-08-14 NOTE — Progress Notes (Signed)
Patient had removed his sling stating that it was painful. Sling was replaced and patient was instructed not to remove it because it was place to keep his arm stable. He voiced understanding 650 mg of Tylenol was given for pain/

## 2022-08-14 NOTE — Discharge Instructions (Addendum)
After Your Pacemaker   You have a Abbott Pacemaker  ACTIVITY Do not lift your arm above shoulder height for 1 week after your procedure. After 7 days, you may progress as below.  You should remove your sling 24 hours after your procedure, unless otherwise instructed by your provider.     Tuesday August 21, 2022  Wednesday August 22, 2022 Thursday August 23, 2022 Friday August 24, 2022   Do not lift, push, pull, or carry anything over 10 pounds with the affected arm until 6 weeks (Tuesday September 25, 2022 ) after your procedure.   You may drive AFTER your wound check, unless you have been told otherwise by your provider.   Ask your healthcare provider when you can go back to work   INCISION/Dressing If large square, outer bandage is left in place, this can be removed after 24 hours from your procedure. Do not remove steri-strips or glue as below.   Monitor your Pacemaker site for redness, swelling, and drainage. Call the device clinic at [phone] if you experience these symptoms or fever/chills.  If your incision is sealed with Steri-strips or staples, you may shower 7 days after your procedure or when told by your provider. Do not remove the steri-strips or let the shower hit directly on your site. You may wash around your site with soap and water.    If you were discharged in a sling, please do not wear this during the day more than 48 hours after your surgery unless otherwise instructed. This may increase the risk of stiffness and soreness in your shoulder.   Avoid lotions, ointments, or perfumes over your incision until it is well-healed.  You may use a hot tub or a pool AFTER your wound check appointment if the incision is completely closed.  Pacemaker Alerts:  Some alerts are vibratory and others beep. These are NOT emergencies. Please call our office to let us know. If this occurs at night or on weekends, it can wait until the next business day. Send a remote  transmission.  If your device is capable of reading fluid status (for heart failure), you will be offered monthly monitoring to review this with you.   DEVICE MANAGEMENT Remote monitoring is used to monitor your pacemaker from home. This monitoring is scheduled every 91 days by our office. It allows [place] to keep an eye on the functioning of your device to ensure it is working properly. You will routinely see your Electrophysiologist annually (more often if necessary).   You should receive your ID card for your new device in 4-8 weeks. Keep this card with you at all times once received. Consider wearing a medical alert bracelet or necklace.  Your Pacemaker may be MRI compatible. This will be discussed at your next office visit/wound check.  You should avoid contact with strong electric or magnetic fields.   Do not use amateur (ham) radio equipment or electric (arc) welding torches. MP3 player headphones with magnets should not be used. Some devices are safe to use if held at least 12 inches (30 cm) from your Pacemaker. These include power tools, lawn mowers, and speakers. If you are unsure if something is safe to use, ask your health care provider.  When using your cell phone, hold it to the ear that is on the opposite side from the Pacemaker. Do not leave your cell phone in a pocket over the Pacemaker.  You may safely use electric blankets, heating pads, computers, and microwave ovens.  Call the office right away if: You have chest pain. You feel more short of breath than you have felt before. You feel more light-headed than you have felt before. Your incision starts to open up.  This information is not intended to replace advice given to you by your health care provider. Make sure you discuss any questions you have with your health care provider.

## 2022-08-14 NOTE — Plan of Care (Signed)
  Problem: Education: Goal: Understanding of cardiac disease, CV risk reduction, and recovery process will improve Outcome: Progressing   Problem: Activity: Goal: Ability to tolerate increased activity will improve Outcome: Progressing   Problem: Cardiac: Goal: Ability to achieve and maintain adequate cardiovascular perfusion will improve Outcome: Progressing   Problem: Health Behavior/Discharge Planning: Goal: Ability to safely manage health-related needs after discharge will improve Outcome: Progressing   Problem: Education: Goal: Ability to describe self-care measures that may prevent or decrease complications (Diabetes Survival Skills Education) will improve Outcome: Progressing   Problem: Coping: Goal: Ability to adjust to condition or change in health will improve Outcome: Progressing   Problem: Fluid Volume: Goal: Ability to maintain a balanced intake and output will improve Outcome: Completed/Met   Problem: Health Behavior/Discharge Planning: Goal: Ability to identify and utilize available resources and services will improve Outcome: Progressing Goal: Ability to manage health-related needs will improve Outcome: Progressing   Problem: Metabolic: Goal: Ability to maintain appropriate glucose levels will improve Outcome: Progressing   Problem: Nutritional: Goal: Maintenance of adequate nutrition will improve Outcome: Completed/Met   Problem: Skin Integrity: Goal: Risk for impaired skin integrity will decrease Outcome: Progressing   Problem: Tissue Perfusion: Goal: Adequacy of tissue perfusion will improve Outcome: Progressing   Problem: Education: Goal: Ability to describe self-care measures that may prevent or decrease complications (Diabetes Survival Skills Education) will improve Outcome: Progressing

## 2022-08-15 ENCOUNTER — Ambulatory Visit: Payer: Medicare Other | Admitting: Internal Medicine

## 2022-08-22 DIAGNOSIS — Z950 Presence of cardiac pacemaker: Secondary | ICD-10-CM | POA: Diagnosis not present

## 2022-08-22 DIAGNOSIS — I442 Atrioventricular block, complete: Secondary | ICD-10-CM | POA: Diagnosis not present

## 2022-08-22 DIAGNOSIS — K219 Gastro-esophageal reflux disease without esophagitis: Secondary | ICD-10-CM | POA: Diagnosis not present

## 2022-08-22 DIAGNOSIS — E1165 Type 2 diabetes mellitus with hyperglycemia: Secondary | ICD-10-CM | POA: Diagnosis not present

## 2022-08-24 ENCOUNTER — Telehealth: Payer: Self-pay | Admitting: Cardiology

## 2022-08-24 NOTE — Telephone Encounter (Signed)
Jose Chase with [employer] calling with additional findings on zio monitor

## 2022-08-24 NOTE — Telephone Encounter (Signed)
Pt had pacemaker placed on 08/13/22 ,Zio made aware.

## 2022-08-26 NOTE — Progress Notes (Unsigned)
Cardiology Office Note Date:  08/26/2022  Patient ID:  Jose Chase 1932-03-28, MRN [id number] PCP:  Carrolyn Meiers, MD  Electrophysiologist: Dr. Quentin Ore  ***refresh   Chief Complaint: *** wound check  History of Present Illness: Jose Chase is a 88 y.o. 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 While in the ED, he had multiple ECGs which showed both Mobitz I and Mobitz II. Patient was reportedly without symptoms though orthostatic vital signs appear to have been positive. Decision was made to send patient home with a live telemetry monitor and scheduled outpatient cardiology follow up.  On 9/28, Zio contacted cardiology after recurrent Mobitz I and suspected Mobitz type II occurred. Patient reported symptoms of dizziness and hiccups at the time of this event. Given lack of recurrent symptoms, decision made to continue monitoring.    On the morning of 10/1, Zio again reached out about a concerning heart block pattern. This report was reviewed and showed Mobitz II with a short period of complete heart block noted, HR in the 30s. Patient was contacted and reported symptoms of dizziness and hiccups. He was advised to report to [facility] ED for further management, likely pacemaker. He was admitted and underwent PPM implant Discharged 08/14/22.  *** site *** restrictions *** acute outputs *** 91 day   Device information Abbott dual chamber PPM implanted 08/13/22   Past Medical History:  Diagnosis Date   Aspirin long-term use    DM (diabetes mellitus) ([place])    Dyspnea    GERD (gastroesophageal reflux disease)    Hemorrhoids, internal 10/13/2007   Hiatal hernia DEC 2008 LARGE   Prostate cancer [facility])     Past Surgical History:   Procedure Laterality Date   BACK SURGERY     COLONOSCOPY  DEC 2542   COMPLICATED BY CONTAINED RECTAL PERFORATION   ESOPHAGOGASTRODUODENOSCOPY  05/29/2011   Procedure: ESOPHAGOGASTRODUODENOSCOPY (EGD);  Surgeon: Dorothyann Peng, MD;  Location: [facility];  Service: Endoscopy;  Laterality: N/A;  Possible ED   ESOPHAGOGASTRODUODENOSCOPY N/A 02/07/2016   Procedure: ESOPHAGOGASTRODUODENOSCOPY (EGD);  Surgeon: Danie Binder, MD;  Location: [facility];  Service: Endoscopy;  Laterality: N/A;  200 - moved to 3/28 @ 10:30 - office notified pt   ESOPHAGOGASTRODUODENOSCOPY (EGD) WITH ESOPHAGEAL DILATION N/A 10/20/2013   HCW:[id number] at the gastroesophageal junction/ Medium sized hiatal hernia/MILD Erosive gastritis. Biopsy showed chronic inflammation, no H. pylori, fragment of fundic gland polyp   MALONEY DILATION  05/29/2011   Procedure: MALONEY DILATION;  Surgeon: Dorothyann Peng, MD;  Location: [facility];  Service: Endoscopy;  Laterality: N/A;   PACEMAKER IMPLANT N/A 08/13/2022   Procedure: PACEMAKER IMPLANT;  Surgeon: Vickie Epley, MD;  Location: [facility];  Service: Cardiovascular;  Laterality: N/A;   PROSTATE SURGERY     prostate cancer   SAVORY DILATION  05/29/2011   Procedure: SAVORY DILATION;  Surgeon: Dorothyann Peng, MD;  Location: [facility];  Service: Endoscopy;  Laterality: N/A;   SAVORY DILATION N/A 02/07/2016   Procedure: SAVORY DILATION;  Surgeon: Danie Binder, MD;  Location: [facility];  Service: Endoscopy;  Laterality: N/A;   UPPER GASTROINTESTINAL ENDOSCOPY  DEC 2008   VV:OHYWVP GASTRIC MUCOSA  Current Outpatient Medications  Medication Sig Dispense Refill   acetaminophen (TYLENOL) 500 MG tablet Take 500 mg by mouth every 6 (six) hours as needed for mild pain or moderate pain.     ALLERGY RELIEF 10 MG tablet Take 10 mg by mouth daily.     ferrous sulfate 325 (65 FE) MG tablet Take 325 mg by mouth daily with breakfast.     gabapentin (NEURONTIN) 100 MG  capsule Take 100 mg by mouth at bedtime.     glipiZIDE (GLUCOTROL) 10 MG tablet Take 10 mg by mouth daily before breakfast.     metFORMIN (GLUCOPHAGE) 850 MG tablet Take 1 tablet (850 mg total) by mouth 2 (two) times daily. 30 tablet 11   Multiple Vitamins-Minerals (MEGA MULTI MEN PO) Take 1 tablet by mouth daily.     omeprazole (PRILOSEC) 40 MG capsule Take 1 capsule (40 mg total) by mouth daily. 90 capsule 3   simvastatin (ZOCOR) 20 MG tablet Take 20 mg by mouth at bedtime.       No current facility-administered medications for this visit.    Allergies:   Patient has no known allergies.   Social History:  The patient  reports that he quit smoking about 49 years ago. His smoking use included cigarettes. He has never used smokeless tobacco. He reports that he does not drink alcohol and does not use drugs.   Family History:  The patient's family history includes Cancer in his brother, brother, sister, and another family member; Heart disease in his brother and father.  ROS:  Please see the history of present illness.    All other systems are reviewed and otherwise negative.   PHYSICAL EXAM:  VS:  There were no vitals taken for this visit. BMI: There is no height or weight on file to calculate BMI. Well nourished, well developed, in no acute distress HEENT: normocephalic, atraumatic Neck: no JVD, carotid bruits or masses Cardiac:  *** RRR; no significant murmurs, no rubs, or gallops Lungs:  *** CTA b/l, no wheezing, rhonchi or rales Abd: soft, nontender MS: no deformity or *** atrophy Ext: *** no edema Skin: warm and dry, no rash Neuro:  No gross deficits appreciated Psych: euthymic mood, full affect  *** PPM site is stable, no tethering or discomfort   EKG:  not done today  Device interrogation done today and reviewed by myself:  ***  08/13/22: TTE 1. Left ventricular ejection fraction, by estimation, is 55 to 60%. The  left ventricle has normal function. The left ventricle  has no regional  wall motion abnormalities. There is mild left ventricular hypertrophy.  Left ventricular diastolic parameters  are consistent with Grade I diastolic dysfunction (impaired relaxation).   2. Right ventricular systolic function is normal. The right ventricular  size is normal. There is normal pulmonary artery systolic pressure.   3. The mitral valve is normal in structure. No evidence of mitral valve  regurgitation. No evidence of mitral stenosis.   4. The aortic valve is tricuspid. Aortic valve regurgitation is not  visualized. Aortic valve sclerosis is present, with no evidence of aortic  valve stenosis.   Recent Labs: 08/07/2022: ALT 12 08/12/2022: BUN 21; Creatinine, Ser 1.12; Hemoglobin 11.6; Magnesium 1.6; Platelets 360; Potassium 4.1; Sodium 144; TSH 1.865  No results found for requested labs within last 365 days.   Estimated Creatinine Clearance: 41.6 mL/min (by C-G formula based on SCr of 1.12 mg/dL).   Wt Readings from Last 3 Encounters:  08/14/22 142 lb  4.8 oz (64.5 kg)  08/07/22 145 lb (65.8 kg)  08/06/22 152 lb 5.4 oz (69.1 kg)     Other studies reviewed: Additional studies/records reviewed today include: summarized above  ASSESSMENT AND PLAN:  PPM ***  Disposition: F/u with ***  Current medicines are reviewed at length with the patient today.  The patient did not have any concerns regarding medicines.  Venetia Night, PA-C 08/26/2022 5:35 PM     [place] [address] [phone] (office)  [phone] (fax)

## 2022-08-28 ENCOUNTER — Ambulatory Visit: Payer: Medicare Other | Attending: Physician Assistant | Admitting: Physician Assistant

## 2022-08-28 ENCOUNTER — Encounter: Payer: Self-pay | Admitting: Physician Assistant

## 2022-08-28 VITALS — BP 122/76 | HR 71 | Ht 72.0 in | Wt 152.0 lb

## 2022-08-28 DIAGNOSIS — Z950 Presence of cardiac pacemaker: Secondary | ICD-10-CM | POA: Diagnosis not present

## 2022-08-28 DIAGNOSIS — Z5189 Encounter for other specified aftercare: Secondary | ICD-10-CM | POA: Insufficient documentation

## 2022-08-28 LAB — CUP PACEART INCLINIC DEVICE CHECK
Battery Remaining Longevity: 111 mo
Battery Voltage: 3.04 V
Brady Statistic RA Percent Paced: 18 %
Brady Statistic RV Percent Paced: 99.65 %
Date Time Interrogation Session: 20231017161129
Implantable Lead Implant Date: 20231002
Implantable Lead Implant Date: 20231002
Implantable Lead Location: 753859
Implantable Lead Location: 753860
Implantable Pulse Generator Implant Date: 20231002
Lead Channel Impedance Value: 400 Ohm
Lead Channel Impedance Value: 700 Ohm
Lead Channel Pacing Threshold Amplitude: 0.625 V
Lead Channel Pacing Threshold Amplitude: 1.375 V
Lead Channel Pacing Threshold Pulse Width: 0.5 ms
Lead Channel Pacing Threshold Pulse Width: 0.5 ms
Lead Channel Sensing Intrinsic Amplitude: 2.7 mV
Lead Channel Sensing Intrinsic Amplitude: 3.4 mV
Lead Channel Setting Pacing Amplitude: 1.625
Lead Channel Setting Pacing Amplitude: 3.5 V
Lead Channel Setting Pacing Pulse Width: 0.5 ms
Lead Channel Setting Sensing Sensitivity: 2 mV
Pulse Gen Model: 2272
Pulse Gen Serial Number: 8115141

## 2022-08-28 NOTE — Patient Instructions (Signed)
Medication Instructions:   Your physician recommends that you continue on your current medications as directed. Please refer to the Current Medication list given to you today.  *If you need a refill on your cardiac medications before your next appointment, please call your pharmacy*   Lab Work: [place]   If you have labs (blood work) drawn today and your tests are completely normal, you will receive your results only by: [place] (if you have MyChart) OR A paper copy in the mail If you have any lab test that is abnormal or we need to change your treatment, we will call you to review the results.   Testing/Procedures: NONE ORDERED  TODAY     Follow-Up: At [facility], you and your health needs are our priority.  As part of our continuing mission to provide you with exceptional heart care, we have created designated Provider Care Teams.  These Care Teams include your primary Cardiologist (physician) and Advanced Practice Providers (APPs -  Physician Assistants and Nurse Practitioners) who all work together to provide you with the care you need, when you need it.  We recommend signing up for the patient portal called "MyChart".  Sign up information is provided on this After Visit Summary.  MyChart is used to connect with patients for Virtual Visits (Telemedicine).  Patients are able to view lab/test results, encounter notes, upcoming appointments, etc.  Non-urgent messages can be sent to your provider as well.   To learn more about what you can do with MyChart, go to [email or web address].    Your next appointment:  AS SCHEUDLED   The format for your next appointment:   In Person  Provider:   Lars Mage, MD    Other Instructions   Important Information About Sugar

## 2022-09-05 DIAGNOSIS — Z23 Encounter for immunization: Secondary | ICD-10-CM | POA: Diagnosis not present

## 2022-09-14 ENCOUNTER — Encounter: Payer: Self-pay | Admitting: Gastroenterology

## 2022-09-14 ENCOUNTER — Ambulatory Visit (INDEPENDENT_AMBULATORY_CARE_PROVIDER_SITE_OTHER): Payer: Medicare Other | Admitting: Gastroenterology

## 2022-09-14 VITALS — BP 123/72 | HR 61 | Temp 97.5°F | Ht 72.0 in | Wt 150.4 lb

## 2022-09-14 DIAGNOSIS — R1319 Other dysphagia: Secondary | ICD-10-CM | POA: Diagnosis not present

## 2022-09-14 DIAGNOSIS — K5900 Constipation, unspecified: Secondary | ICD-10-CM | POA: Diagnosis not present

## 2022-09-14 NOTE — Progress Notes (Signed)
GI Office Note    Referring Provider: Carrolyn Meiers* Primary Care Physician:  Carrolyn Meiers, MD  Primary Gastroenterologist: Elon Alas. Abbey Chatters, DO   Chief Complaint   Chief Complaint  Patient presents with   Dysphagia    Still having issues swallowing.      History of Present Illness   Jose Chase is a 88 y.o. male presenting today for follow-up of dysphagia.  Last seen in August.  History of dysphagia in the past status post dilation by EGD in 2017.  Modified barium swallow study July 2023 with some oropharyngeal dysfunction but more notable a distal esophageal stricture at the level of the GE junction.  Patient was going to have an EGD but while in the preprocedure area he was noted to have some degree of AV block and was sent to the ED. Subsequently he is underwent a pacemaker placement on October 2. According to cardiology note from August 28, 2022, he can pursue esophageal dilation at this point from a pacemaker perspective.  Last office visit he was started on omeprazole 40 mg daily.  Overall weight has been stable since August. Patient continues to have trouble with food coming back up. Also has white foam come up. Eating soft foods only. Denies heartburn. No abdominal pain. BMs small, little stool daily but can be hard. No melena, brbpr.   Medications   Current Outpatient Medications  Medication Sig Dispense Refill   acetaminophen (TYLENOL) 500 MG tablet Take 500 mg by mouth every 6 (six) hours as needed for mild pain or moderate pain.     ALLERGY RELIEF 10 MG tablet Take 10 mg by mouth daily.     gabapentin (NEURONTIN) 100 MG capsule Take 100 mg by mouth at bedtime.     glipiZIDE (GLUCOTROL) 10 MG tablet Take 10 mg by mouth daily before breakfast.     metFORMIN (GLUCOPHAGE) 850 MG tablet Take 1 tablet (850 mg total) by mouth 2 (two) times daily. 30 tablet 11   Multiple Vitamins-Minerals (MEGA MULTI MEN PO) Take 1 tablet by mouth daily.      omeprazole (PRILOSEC) 40 MG capsule Take 1 capsule (40 mg total) by mouth daily. 90 capsule 3   simvastatin (ZOCOR) 20 MG tablet Take 20 mg by mouth at bedtime.       No current facility-administered medications for this visit.    Allergies   Allergies as of 09/14/2022   (No Known Allergies)    Past Medical History   Past Medical History:  Diagnosis Date   Aspirin long-term use    DM (diabetes mellitus) ([place])    Dyspnea    GERD (gastroesophageal reflux disease)    Hemorrhoids, internal 10/13/2007   Hiatal hernia DEC 2008 LARGE   Prostate cancer [facility])     Past Surgical History   Past Surgical History:  Procedure Laterality Date   BACK SURGERY     COLONOSCOPY  DEC 8022   COMPLICATED BY CONTAINED RECTAL PERFORATION   ESOPHAGOGASTRODUODENOSCOPY  05/29/2011   Procedure: ESOPHAGOGASTRODUODENOSCOPY (EGD);  Surgeon: Dorothyann Peng, MD;  [facility];  Service: Endoscopy;  Laterality: N/A;  Possible ED   ESOPHAGOGASTRODUODENOSCOPY N/A 02/07/2016   Procedure: ESOPHAGOGASTRODUODENOSCOPY (EGD);  Surgeon: Danie Binder, MD;  [facility];  Service: Endoscopy;  Laterality: N/A;  200 - moved to 3/28 @ 10:30 - office notified pt   ESOPHAGOGASTRODUODENOSCOPY (EGD) WITH ESOPHAGEAL DILATION N/A 10/20/2013   VVK:PQAESLPNP at the gastroesophageal junction/ Medium  sized hiatal hernia/MILD Erosive gastritis. Biopsy showed chronic inflammation, no H. pylori, fragment of fundic gland polyp   MALONEY DILATION  05/29/2011   Procedure: MALONEY DILATION;  Surgeon: Dorothyann Peng, MD;  [facility];  Service: Endoscopy;  Laterality: N/A;   PACEMAKER IMPLANT N/A 08/13/2022   Procedure: PACEMAKER IMPLANT;  Surgeon: Vickie Epley, MD;  Location: [place] CV LAB;  Service: Cardiovascular;  Laterality: N/A;   PROSTATE SURGERY     prostate cancer   SAVORY DILATION  05/29/2011   Procedure: SAVORY DILATION;  Surgeon: Dorothyann Peng, MD;  [facility];  Service:  Endoscopy;  Laterality: N/A;   SAVORY DILATION N/A 02/07/2016   Procedure: SAVORY DILATION;  Surgeon: Danie Binder, MD;  [facility];  Service: Endoscopy;  Laterality: N/A;   UPPER GASTROINTESTINAL ENDOSCOPY  DEC 2008   PJ:ASNKNL GASTRIC MUCOSA    Past Family History   Family History  Problem Relation Age of Onset   Heart disease Father    Heart disease Brother    Cancer Brother    Cancer Brother    Cancer Other    Cancer Sister    Colon cancer Neg Hx    Colon polyps Neg Hx     Past Social History   Social History   Socioeconomic History   Marital status: Married    Spouse name: Not on file   Number of children: Not on file   Years of education: Not on file   Highest education level: Not on file  Occupational History   Not on file  Tobacco Use   Smoking status: Former    Types: Cigarettes    Quit date: 01/11/1973    Years since quitting: 49.7   Smokeless tobacco: Never   Tobacco comments:    Never smoked very much. Occasional smoker-once or twice a week. Quit in 1974.  Vaping Use   Vaping Use: Never used  Substance and Sexual Activity   Alcohol use: No    Alcohol/week: 0.0 standard drinks of alcohol   Drug use: No   Sexual activity: Not Currently  Other Topics Concern   Not on file  Social History Narrative   Not on file   Social Determinants of Health   Financial Resource Strain: Not on file  Food Insecurity: Not on file  Transportation Needs: Not on file  Physical Activity: Not on file  Stress: Not on file  Social Connections: Not on file  Intimate Partner Violence: Not on file    Review of Systems   General: Negative for anorexia, weight loss, fever, chills, fatigue, +weakness. See hpi Eyes: Negative for vision changes.  ENT: Negative for hoarseness, difficulty swallowing , nasal congestion. CV: Negative for chest pain, angina, palpitations, dyspnea on exertion, peripheral edema.  Respiratory: Negative for dyspnea at rest, dyspnea on  exertion, cough, sputum, wheezing.  GI: See history of present illness. GU:  Negative for dysuria, hematuria, urinary incontinence, urinary frequency, nocturnal urination.  MS: Negative for joint pain, low back pain.  Derm: Negative for rash or itching.  Neuro: Negative for weakness, abnormal sensation, seizure, frequent headaches, memory loss,  confusion.  Psych: Negative for anxiety, depression, suicidal ideation, hallucinations.  Endo: Negative for unusual weight change.  Heme: Negative for bruising or bleeding. Allergy: Negative for rash or hives.  Physical Exam   BP 123/72 (BP Location: Right Arm, Patient Position: Sitting, Cuff Size: Normal)   Pulse 61   Temp (!) 97.5 F (36.4  C) (Oral)   Ht 6' (1.829 m)   Wt 150 lb 6.4 oz (68.2 kg)   SpO2 96%   BMI 20.40 kg/m    General: thin, well-developed in no acute distress.  Head: Normocephalic, atraumatic.   Eyes: Conjunctiva pink, no icterus. Mouth: Oropharyngeal mucosa moist and pink , no lesions erythema or exudate. Neck: Supple without thyromegaly, masses, or lymphadenopathy.  Lungs: Clear to auscultation bilaterally. Pacemaker noted in left upper chest, incision well healed.  Heart: Regular rate and rhythm, no murmurs rubs or gallops.  Abdomen: Bowel sounds are normal, nontender, nondistended, no hepatosplenomegaly or masses,  no abdominal bruits or hernia, no rebound or guarding.   Rectal: not performed Extremities: No lower extremity edema. No clubbing or deformities.  Neuro: Alert and oriented x 4 , grossly normal neurologically.  Skin: Warm and dry, no rash or jaundice.   Psych: Alert and cooperative, normal mood and affect.  Labs   Lab Results  Component Value Date   CREATININE 1.12 08/12/2022   BUN 21 08/12/2022   NA 144 08/12/2022   K 4.1 08/12/2022   CL 112 (H) 08/12/2022   CO2 23 08/12/2022   Lab Results  Component Value Date   ALT 12 08/07/2022   AST 22 08/07/2022   ALKPHOS 60 08/07/2022   BILITOT  1.0 08/07/2022   Lab Results  Component Value Date   WBC 5.5 08/12/2022   HGB 11.6 (L) 08/12/2022   HCT 39.3 08/12/2022   MCV 71.6 (L) 08/12/2022   PLT 360 08/12/2022   No results found for: "IRON", "TIBC", "FERRITIN"  Imaging Studies   LONG TERM MONITOR-LIVE TELEMETRY (3-14 DAYS)  Result Date: 08/28/2022 ZIO AT reviewed.  5 days, 20 hours analyzed. Predominant rhythm is sinus with prolonged PR interval.  Heart rate ranged from 39 bpm up to 133 bpm and average heart rate 72 bpm. There were rare PACs including atrial couplets representing less than 1% total beats. There were rare PVCs including ventricular couplets representing less than 1% total beats. Multiple episodes of second-degree, type I to II heart block as well as complete heart block were noted with heart rate dipping down into the 20s transiently.  Notification criteria met and patient contacted during the monitoring period.   CUP PACEART INCLINIC DEVICE CHECK  Result Date: 08/28/2022 Pacemaker check in clinic. Normal device function. Thresholds, sensing, impedances consistent with previous measurements. Device programmed to maximize longevity. No mode switch or high ventricular rates noted. Device programmed at appropriate safety margins. Histogram distribution appropriate for patient activity level. Device programmed to optimize intrinsic conduction. Estimated longevity _6.8-9.4 years__. Patient enrolled in remote follow-up. Patient education completed.   Assessment   GERD/dysphagia: Chronic GERD, typical symptoms doing well.  He is now back on omeprazole 40 mg daily.  History of abnormal modified barium swallow study with concern for primary esophageal dysphagia given column of barium and a dilated esophagus and narrowing of the GE junction noted.  Continues to have issues swallowing and regurgitation.  Fortunately he is maintaining his weight.  Recommend EGD with possible esophageal dilation in the near future.  Of note,  patient had pacemaker placement as outlined above.  Per cardiology's last note, he can proceed with esophageal dilation.  Constipation: In the setting of decreased oral intake.  Add docusate sodium 100 to 200 mg daily.   PLAN   EGD with esophageal dilation with Dr. Abbey Chatters.  ASA 3.  I have discussed the risks, alternatives, benefits with regards to but not limited  to the risk of reaction to medication, bleeding, infection, perforation and the patient is agreeable to proceed. Written consent to be obtained. Continue omeprazole 40 mg daily before breakfast. Continue soft foods for now. Add docusate sodium 100 to 200 mg daily.   Laureen Ochs. Bobby Rumpf, [place], [facility]

## 2022-09-14 NOTE — Patient Instructions (Addendum)
Upper endoscopy to be scheduled. See separate instructions.  Continue to eat soft foods until your endoscopy.  Add stool softener daily such as docusate sodium 100 - 200 mg daily.

## 2022-09-19 ENCOUNTER — Encounter: Payer: Self-pay | Admitting: *Deleted

## 2022-09-22 DIAGNOSIS — E1165 Type 2 diabetes mellitus with hyperglycemia: Secondary | ICD-10-CM | POA: Diagnosis not present

## 2022-09-22 DIAGNOSIS — K219 Gastro-esophageal reflux disease without esophagitis: Secondary | ICD-10-CM | POA: Diagnosis not present

## 2022-10-03 ENCOUNTER — Observation Stay (HOSPITAL_COMMUNITY)
Admission: EM | Admit: 2022-10-03 | Discharge: 2022-10-04 | Disposition: A | Discharge status: Home / Self Care | Payer: Medicare Other | Attending: Cardiovascular Disease | Admitting: Cardiovascular Disease

## 2022-10-03 ENCOUNTER — Other Ambulatory Visit: Payer: Self-pay

## 2022-10-03 ENCOUNTER — Encounter (HOSPITAL_COMMUNITY): Payer: Self-pay | Admitting: Emergency Medicine

## 2022-10-03 ENCOUNTER — Emergency Department (HOSPITAL_COMMUNITY): Payer: Medicare Other

## 2022-10-03 ENCOUNTER — Ambulatory Visit (HOSPITAL_COMMUNITY): Admit: 2022-10-03 | Payer: Medicare Other | Admitting: Cardiology

## 2022-10-03 ENCOUNTER — Encounter (HOSPITAL_COMMUNITY)
Admission: EM | Disposition: A | Discharge status: Home / Self Care | Payer: Self-pay | Source: Home / Self Care | Attending: Emergency Medicine

## 2022-10-03 DIAGNOSIS — I491 Atrial premature depolarization: Secondary | ICD-10-CM | POA: Diagnosis not present

## 2022-10-03 DIAGNOSIS — Z7984 Long term (current) use of oral hypoglycemic drugs: Secondary | ICD-10-CM | POA: Insufficient documentation

## 2022-10-03 DIAGNOSIS — I959 Hypotension, unspecified: Secondary | ICD-10-CM | POA: Diagnosis not present

## 2022-10-03 DIAGNOSIS — Z8546 Personal history of malignant neoplasm of prostate: Secondary | ICD-10-CM | POA: Insufficient documentation

## 2022-10-03 DIAGNOSIS — R55 Syncope and collapse: Secondary | ICD-10-CM

## 2022-10-03 DIAGNOSIS — R0602 Shortness of breath: Secondary | ICD-10-CM | POA: Diagnosis not present

## 2022-10-03 DIAGNOSIS — I4891 Unspecified atrial fibrillation: Secondary | ICD-10-CM

## 2022-10-03 DIAGNOSIS — I4430 Unspecified atrioventricular block: Secondary | ICD-10-CM | POA: Diagnosis not present

## 2022-10-03 DIAGNOSIS — G9340 Encephalopathy, unspecified: Secondary | ICD-10-CM | POA: Insufficient documentation

## 2022-10-03 DIAGNOSIS — R531 Weakness: Secondary | ICD-10-CM | POA: Diagnosis not present

## 2022-10-03 DIAGNOSIS — T82110A Breakdown (mechanical) of cardiac electrode, initial encounter: Secondary | ICD-10-CM | POA: Diagnosis present

## 2022-10-03 DIAGNOSIS — Y839 Surgical procedure, unspecified as the cause of abnormal reaction of the patient, or of later complication, without mention of misadventure at the time of the procedure: Secondary | ICD-10-CM | POA: Diagnosis not present

## 2022-10-03 DIAGNOSIS — R29818 Other symptoms and signs involving the nervous system: Secondary | ICD-10-CM | POA: Diagnosis not present

## 2022-10-03 DIAGNOSIS — I440 Atrioventricular block, first degree: Secondary | ICD-10-CM | POA: Diagnosis not present

## 2022-10-03 DIAGNOSIS — R001 Bradycardia, unspecified: Secondary | ICD-10-CM | POA: Diagnosis not present

## 2022-10-03 DIAGNOSIS — Z950 Presence of cardiac pacemaker: Secondary | ICD-10-CM | POA: Insufficient documentation

## 2022-10-03 DIAGNOSIS — R2689 Other abnormalities of gait and mobility: Secondary | ICD-10-CM | POA: Diagnosis not present

## 2022-10-03 DIAGNOSIS — Z87891 Personal history of nicotine dependence: Secondary | ICD-10-CM | POA: Diagnosis not present

## 2022-10-03 DIAGNOSIS — I6782 Cerebral ischemia: Secondary | ICD-10-CM | POA: Insufficient documentation

## 2022-10-03 DIAGNOSIS — I442 Atrioventricular block, complete: Secondary | ICD-10-CM | POA: Diagnosis not present

## 2022-10-03 DIAGNOSIS — R0989 Other specified symptoms and signs involving the circulatory and respiratory systems: Secondary | ICD-10-CM | POA: Diagnosis not present

## 2022-10-03 DIAGNOSIS — I951 Orthostatic hypotension: Secondary | ICD-10-CM | POA: Insufficient documentation

## 2022-10-03 DIAGNOSIS — E119 Type 2 diabetes mellitus without complications: Secondary | ICD-10-CM | POA: Diagnosis not present

## 2022-10-03 DIAGNOSIS — T82120A Displacement of cardiac electrode, initial encounter: Principal | ICD-10-CM | POA: Insufficient documentation

## 2022-10-03 DIAGNOSIS — I499 Cardiac arrhythmia, unspecified: Secondary | ICD-10-CM | POA: Diagnosis not present

## 2022-10-03 HISTORY — PX: LEAD REVISION/REPAIR: EP1213

## 2022-10-03 LAB — CBC WITH DIFFERENTIAL/PLATELET
Abs Immature Granulocytes: 0.05 10*3/uL (ref 0.00–0.07)
Basophils Absolute: 0 10*3/uL (ref 0.0–0.1)
Basophils Relative: 0 %
Eosinophils Absolute: 0 10*3/uL (ref 0.0–0.5)
Eosinophils Relative: 0 %
HCT: 42.3 % (ref 39.0–52.0)
Hemoglobin: 12.2 g/dL — ABNORMAL LOW (ref 13.0–17.0)
Immature Granulocytes: 1 %
Lymphocytes Relative: 17 %
Lymphs Abs: 1.8 10*3/uL (ref 0.7–4.0)
MCH: 21.2 pg — ABNORMAL LOW (ref 26.0–34.0)
MCHC: 28.8 g/dL — ABNORMAL LOW (ref 30.0–36.0)
MCV: 73.6 fL — ABNORMAL LOW (ref 80.0–100.0)
Monocytes Absolute: 0.5 10*3/uL (ref 0.1–1.0)
Monocytes Relative: 5 %
Neutro Abs: 8.2 10*3/uL — ABNORMAL HIGH (ref 1.7–7.7)
Neutrophils Relative %: 77 %
Platelets: 354 10*3/uL (ref 150–400)
RBC: 5.75 MIL/uL (ref 4.22–5.81)
RDW: 21.1 % — ABNORMAL HIGH (ref 11.5–15.5)
WBC: 10.6 10*3/uL — ABNORMAL HIGH (ref 4.0–10.5)
nRBC: 0 % (ref 0.0–0.2)

## 2022-10-03 LAB — I-STAT CHEM 8, ED
BUN: 13 mg/dL (ref 8–23)
Calcium, Ion: 1.15 mmol/L (ref 1.15–1.40)
Chloride: 107 mmol/L (ref 98–111)
Creatinine, Ser: 1 mg/dL (ref 0.61–1.24)
Glucose, Bld: 199 mg/dL — ABNORMAL HIGH (ref 70–99)
HCT: 44 % (ref 39.0–52.0)
Hemoglobin: 15 g/dL (ref 13.0–17.0)
Potassium: 3.1 mmol/L — ABNORMAL LOW (ref 3.5–5.1)
Sodium: 143 mmol/L (ref 135–145)
TCO2: 22 mmol/L (ref 22–32)

## 2022-10-03 LAB — LACTIC ACID, PLASMA
Lactic Acid, Venous: 5.6 mmol/L (ref 0.5–1.9)
Lactic Acid, Venous: 4.2 mmol/L (ref 0.5–1.9)

## 2022-10-03 LAB — COMPREHENSIVE METABOLIC PANEL
ALT: 15 U/L (ref 0–44)
AST: 34 U/L (ref 15–41)
Albumin: 3.6 g/dL (ref 3.5–5.0)
Alkaline Phosphatase: 77 U/L (ref 38–126)
Anion gap: 14 (ref 5–15)
BUN: 11 mg/dL (ref 8–23)
CO2: 21 mmol/L — ABNORMAL LOW (ref 22–32)
Calcium: 9.1 mg/dL (ref 8.9–10.3)
Chloride: 106 mmol/L (ref 98–111)
Creatinine, Ser: 1.15 mg/dL (ref 0.61–1.24)
GFR, Estimated: >60 mL/min (ref >60)
Glucose, Bld: 200 mg/dL — ABNORMAL HIGH (ref 70–99)
Potassium: 3 mmol/L — ABNORMAL LOW (ref 3.5–5.1)
Sodium: 141 mmol/L (ref 135–145)
Total Bilirubin: 0.6 mg/dL (ref 0.3–1.2)
Total Protein: 7.6 g/dL (ref 6.5–8.1)

## 2022-10-03 LAB — BASIC METABOLIC PANEL
Anion gap: 11 (ref 5–15)
BUN: 12 mg/dL (ref 8–23)
CO2: 21 mmol/L — ABNORMAL LOW (ref 22–32)
Calcium: 8.9 mg/dL (ref 8.9–10.3)
Chloride: 109 mmol/L (ref 98–111)
Creatinine, Ser: 0.98 mg/dL (ref 0.61–1.24)
GFR, Estimated: >60 mL/min (ref >60)
Glucose, Bld: 170 mg/dL — ABNORMAL HIGH (ref 70–99)
Potassium: 4.5 mmol/L (ref 3.5–5.1)
Sodium: 141 mmol/L (ref 135–145)

## 2022-10-03 LAB — GLUCOSE, CAPILLARY: Glucose-Capillary: 206 mg/dL — ABNORMAL HIGH (ref 70–99)

## 2022-10-03 LAB — TROPONIN I (HIGH SENSITIVITY)
Troponin I (High Sensitivity): 9 ng/L (ref <18)
Troponin I (High Sensitivity): 302 ng/L (ref <18)

## 2022-10-03 LAB — PROTIME-INR
INR: 1.1 (ref 0.8–1.2)
Prothrombin Time: 14.1 seconds (ref 11.4–15.2)

## 2022-10-03 SURGERY — LEAD REVISION/REPAIR

## 2022-10-03 MED ORDER — LACTATED RINGERS IV BOLUS
1000.0000 mL | Freq: Once | INTRAVENOUS | Status: AC
  Administered 2022-10-03: 1000 mL via INTRAVENOUS

## 2022-10-03 MED ORDER — SODIUM CHLORIDE 0.9 % IV SOLN: INTRAVENOUS | Status: DC

## 2022-10-03 MED ORDER — LIDOCAINE HCL (PF) 1 % IJ SOLN
INTRAMUSCULAR | Status: DC | PRN
  Administered 2022-10-03: 60 mL

## 2022-10-03 MED ORDER — SODIUM CHLORIDE 0.9% FLUSH: 3.0000 mL | INTRAVENOUS | Status: DC | PRN

## 2022-10-03 MED ORDER — SODIUM CHLORIDE 0.9% FLUSH
3.0000 mL | Freq: Two times a day (BID) | INTRAVENOUS | Status: DC
  Administered 2022-10-04: 3 mL via INTRAVENOUS

## 2022-10-03 MED ORDER — ACETAMINOPHEN 325 MG PO TABS: 650.0000 mg | ORAL_TABLET | ORAL | Status: DC | PRN

## 2022-10-03 MED ORDER — SODIUM CHLORIDE 0.9 % IV SOLN
INTRAVENOUS | Status: AC
  Filled 2022-10-03: qty 2

## 2022-10-03 MED ORDER — POTASSIUM CHLORIDE 20 MEQ PO PACK: 40.0000 meq | PACK | Freq: Once | ORAL | Status: DC

## 2022-10-03 MED ORDER — SIMVASTATIN 20 MG PO TABS
20.0000 mg | ORAL_TABLET | Freq: Every day | ORAL | Status: DC
  Administered 2022-10-03: 20 mg via ORAL
  Filled 2022-10-03: qty 1

## 2022-10-03 MED ORDER — HEPARIN (PORCINE) IN NACL 1000-0.9 UT/500ML-% IV SOLN
INTRAVENOUS | Status: DC | PRN
  Administered 2022-10-03: 500 mL

## 2022-10-03 MED ORDER — CEFAZOLIN SODIUM-DEXTROSE 2-4 GM/100ML-% IV SOLN
2.0000 g | INTRAVENOUS | Status: AC
  Administered 2022-10-03: 2 g via INTRAVENOUS

## 2022-10-03 MED ORDER — NOREPINEPHRINE 4 MG/250ML-% IV SOLN
0.0000 ug/min | INTRAVENOUS | Status: DC
  Administered 2022-10-03: 5 ug/min via INTRAVENOUS

## 2022-10-03 MED ORDER — CEFAZOLIN SODIUM-DEXTROSE 2-4 GM/100ML-% IV SOLN
INTRAVENOUS | Status: AC
  Filled 2022-10-03: qty 100

## 2022-10-03 MED ORDER — CHLORHEXIDINE GLUCONATE 4 % EX LIQD: 60.0000 mL | Freq: Once | CUTANEOUS | Status: DC

## 2022-10-03 MED ORDER — SODIUM CHLORIDE 0.9 % IV BOLUS
500.0000 mL | Freq: Once | INTRAVENOUS | Status: AC
  Administered 2022-10-03: 500 mL via INTRAVENOUS

## 2022-10-03 MED ORDER — ONDANSETRON HCL 4 MG/2ML IJ SOLN: 4.0000 mg | Freq: Four times a day (QID) | INTRAMUSCULAR | Status: DC | PRN

## 2022-10-03 MED ORDER — LIDOCAINE HCL (PF) 1 % IJ SOLN
INTRAMUSCULAR | Status: AC
  Filled 2022-10-03: qty 60

## 2022-10-03 MED ORDER — NITROGLYCERIN 0.4 MG SL SUBL: 0.4000 mg | SUBLINGUAL_TABLET | SUBLINGUAL | Status: DC | PRN

## 2022-10-03 MED ORDER — ALUM & MAG HYDROXIDE-SIMETH 200-200-20 MG/5ML PO SUSP
30.0000 mL | ORAL | Status: DC | PRN
  Administered 2022-10-03: 30 mL via ORAL
  Filled 2022-10-03: qty 30

## 2022-10-03 MED ORDER — SODIUM CHLORIDE 0.9 % IV SOLN: 250.0000 mL | INTRAVENOUS | Status: DC

## 2022-10-03 MED ORDER — SODIUM CHLORIDE 0.9 % IV SOLN
80.0000 mg | INTRAVENOUS | Status: AC
  Administered 2022-10-03: 80 mg
  Filled 2022-10-03: qty 2

## 2022-10-03 MED ORDER — POTASSIUM CHLORIDE 10 MEQ/100ML IV SOLN: 10.0000 meq | INTRAVENOUS | Status: AC

## 2022-10-03 SURGICAL SUPPLY — 9 items
CABLE SURGICAL S-101-97-12 (CABLE) ×1 IMPLANT
LEAD ULTIPACE 58 LPA1231/58 (Lead) IMPLANT
PAD DEFIB RADIO PHYSIO CONN (PAD) ×1 IMPLANT
POUCH AIGIS-R ANTIBACT PPM (Mesh General) ×1 IMPLANT
POUCH AIGIS-R ANTIBACT PPM MED (Mesh General) IMPLANT
SHEATH 7FR PRELUDE SNAP 13 (SHEATH) IMPLANT
SHEATH 9FR PRELUDE SNAP 13 (SHEATH) IMPLANT
SHEATH PROBE COVER 6X72 (BAG) IMPLANT
TRAY PACEMAKER INSERTION (PACKS) ×1 IMPLANT

## 2022-10-03 NOTE — Consult Note (Signed)
NAME:  Jose Chase, MRN:  [id number], DOB:  [birth year], LOS: 0 ADMISSION DATE:  10/03/2022, CONSULTATION DATE:  10/03/22 REFERRING MD:  Tamera Punt CHIEF COMPLAINT:  Weakness  History of Present Illness:  Jose Chase is a 88 y.o. male who has a PMH as below. He had recent admission 08/12/22 through 08/14/22 for PPM implantation after symptomatic bradycardia and heart block. He was discharged and did well.  On 11/22, wife found him on the floor of his room with generalized weakness, abnormal respirations and some dysartria. He was apparently in his usual state of health the night prior when he went to bed. EMS was called and upon their arrival, he was found to have SVT with rates 130-200 followed by an episode of sustained VT with rate 220. He self converted prior to any interventions. He was then noted to be hypotensive to 60/20s. He was given 861m NS and transported to ED.  In ED, he was initially in A.fib. Given ongoing hypotension, he was started on low dose Levophed. He was taken to CT where CT head was negative or acute infarct. Of note, his CXR shows a misplaced PPM lead. He is being evaluated by cardiology for further management. His Levophed is being weaned off and he is receiving additional fluids.  Pertinent  Medical History:  has Dysphagia; Gastritis and gastroduodenitis; Loss of weight; Complete heart block ([place]; and Constipation on their problem list.  Significant Hospital Events: Including procedures, antibiotic start and stop dates in addition to other pertinent events   11/22 admit.  Interim History / Subjective:  On 5 Levo with MAP 93. Receiving additional fluids. Cardiology at bedside.  Objective:  Blood pressure 124/85, pulse 80, temperature (!) 96.4 F (35.8 C), resp. rate 20, SpO2 100 %.       No intake or output data in the 24 hours ending 10/03/22 1619 There were no vitals filed for this visit.  Examination: General: Adult male, resting in bed, in NAD. Neuro:  Awake, dysarthric but able to move all extremities against resistance. HEENT: [place]/AT. Sclerae anicteric. EOMI. MM dry. Cardiovascular: Paced, regular, no M/R/G.  Lungs: Respirations even and unlabored.  CTA bilaterally, No W/R/R. Abdomen: BS x 4, soft, NT/ND.  Musculoskeletal: No gross deformities, no edema.  Skin: Intact, warm, no rashes.  Labs[facility]imaging personally [facility]  [facility]. MRI brain 11/22 >   Assessment & Plan:   Hypotension - presumed cardiac + volume depletion. SVT/VT followed by A.fib - Additional 1.5L fluids now. - Wean off Levophed. - Discussed with cardiology, cards to admit and EP to take to cath lab to address misplaced PPM lead.  Hypokalemia. - 4 runs K. - Follow BMP.  AMS - initial CT negative. Has ongoing dysarthria. - Would get brain MRI for completeness.   Rest per primary team. CCM will sign off. Please call back if we can be of further assistance.  Best practice (evaluated daily):  Per primary team.  Labs   CBC: Recent Labs  Lab 10/03/22 1259 10/03/22 1308  WBC 10.6*  --   NEUTROABS 8.2*  --   HGB 12.2* 15.0  HCT 42.3 44.0  MCV 73.6*  --   PLT 354  --     Basic Metabolic Panel: Recent Labs  Lab 10/03/22 1259 10/03/22 1308  NA 141 143  K 3.0* 3.1*  CL 106 107  CO2 21*  --   GLUCOSE 200* 199*  BUN 11 13  CREATININE 1.15 1.00  CALCIUM 9.1  --  GFR: CrCl cannot be calculated (Unknown ideal weight.). Recent Labs  Lab 10/03/22 1259  WBC 10.6*    Liver Function Tests: Recent Labs  Lab 10/03/22 1259  AST 34  ALT 15  ALKPHOS 77  BILITOT 0.6  PROT 7.6  ALBUMIN 3.6   No results for input(s): "LIPASE", "AMYLASE" in the last 168 hours. No results for input(s): "AMMONIA" in the last 168 hours.  ABG    Component Value Date/Time   TCO2 22 10/03/2022 1308     Coagulation Profile: Recent Labs  Lab 10/03/22 1259  INR 1.1    Cardiac Enzymes: No results for input(s): "CKTOTAL", "CKMB", "CKMBINDEX",  "TROPONINI" in the last 168 hours.  HbA1C: No results found for: "HGBA1C"  CBG: No results for input(s): "GLUCAP" in the last 168 hours.  Review of Systems:   Unable to obtain as pt is dysarthric.  Past Medical History:  He,  has a past medical history of Aspirin long-term use, DM (diabetes mellitus) ([place]), Dyspnea, GERD (gastroesophageal reflux disease), Hemorrhoids, internal (10/13/2007), Hiatal hernia (DEC 2008 LARGE), and Prostate cancer ([place]).   Surgical History:   Past Surgical History:  Procedure Laterality Date   BACK SURGERY     COLONOSCOPY  DEC 1751   COMPLICATED BY CONTAINED RECTAL PERFORATION   ESOPHAGOGASTRODUODENOSCOPY  05/29/2011   Procedure: ESOPHAGOGASTRODUODENOSCOPY (EGD);  Surgeon: Dorothyann Peng, MD;  [facility];  Service: Endoscopy;  Laterality: N/A;  Possible ED   ESOPHAGOGASTRODUODENOSCOPY N/A 02/07/2016   Procedure: ESOPHAGOGASTRODUODENOSCOPY (EGD);  Surgeon: Danie Binder, MD;  [facility];  Service: Endoscopy;  Laterality: N/A;  200 - moved to 3/28 @ 10:30 - office notified pt   ESOPHAGOGASTRODUODENOSCOPY (EGD) WITH ESOPHAGEAL DILATION N/A 10/20/2013   [facility]:ENIDPOEUM at the gastroesophageal junction/ Medium sized hiatal hernia/MILD Erosive gastritis. Biopsy showed chronic inflammation, no H. pylori, fragment of fundic gland polyp   MALONEY DILATION  05/29/2011   Procedure: MALONEY DILATION;  Surgeon: Dorothyann Peng, MD;  [facility];  Service: Endoscopy;  Laterality: N/A;   PACEMAKER IMPLANT N/A 08/13/2022   Procedure: PACEMAKER IMPLANT;  Surgeon: Vickie Epley, MD;  Location: [place] CV LAB;  Service: Cardiovascular;  Laterality: N/A;   PROSTATE SURGERY     prostate cancer   SAVORY DILATION  05/29/2011   Procedure: SAVORY DILATION;  Surgeon: Dorothyann Peng, MD;  [facility];  Service: Endoscopy;  Laterality: N/A;   SAVORY DILATION N/A 02/07/2016   Procedure: SAVORY DILATION;  Surgeon: Danie Binder, MD;   [facility];  Service: Endoscopy;  Laterality: N/A;   UPPER GASTROINTESTINAL ENDOSCOPY  DEC 2008   PN:TIRWER GASTRIC MUCOSA     Social History:   reports that he quit smoking about 49 years ago. His smoking use included cigarettes. He has never used smokeless tobacco. He reports that he does not drink alcohol and does not use drugs.   Family History:  His family history includes Cancer in his brother, brother, sister, and another family member; Heart disease in his brother and father. There is no history of Colon cancer or Colon polyps.   Allergies No Known Allergies   Home Medications  Prior to Admission medications   Medication Sig Start Date End Date Taking? Authorizing Provider  acetaminophen (TYLENOL) 500 MG tablet Take 500 mg by mouth every 6 (six) hours as needed for mild pain or moderate pain.    [provider]  ALLERGY RELIEF 10 MG tablet Take 10 mg  by mouth daily. 02/20/22   [provider]  gabapentin (NEURONTIN) 100 MG capsule Take 100 mg by mouth at bedtime. 04/25/22   [provider]  glipiZIDE (GLUCOTROL) 10 MG tablet Take 10 mg by mouth daily before breakfast.    [provider]  metFORMIN (GLUCOPHAGE) 850 MG tablet Take 1 tablet (850 mg total) by mouth 2 (two) times daily. 08/15/22   Mamie Levers, NP  Multiple Vitamins-Minerals (MEGA MULTI MEN PO) Take 1 tablet by mouth daily.    [provider]  omeprazole (PRILOSEC) 40 MG capsule Take 1 capsule (40 mg total) by mouth daily. 07/13/22   Eloise Harman, DO  simvastatin (ZOCOR) 20 MG tablet Take 20 mg by mouth at bedtime.      [provider]     Critical care time: 35 min.   Montey Hora, [place] [facility] For pager details, please see AMION or use Epic chat  After 1900, please call [facility] for cross coverage needs 10/03/2022, 4:19 PM

## 2022-10-03 NOTE — ED Notes (Signed)
Per Eulas Post, PA from cards. They will be admitting patient after cath lab

## 2022-10-03 NOTE — Consult Note (Signed)
Cardiology Consultation   Patient ID: Jose Chase MRN: [id number]; DOB: November 14, 1931  Admit date: 10/03/2022 Date of Consult: 10/03/2022  PCP:  Carrolyn Meiers, [place] Providers Cardiologist:     Patient Profile:   Jose Chase is a 88 y.o. male with a hx of DM, esophageal stricture, prostate Ca, advanced heart block > PPM  who is being seen 10/03/2022 for the evaluation of syncope and PM lead dislodgement at the request of Dr. Sallyanne Kuster.  History of Present Illness:   Jose Chase had done well until today, unusually weak, by reports seemed to be having some more difficult speech then usual, perhaps some SOB, CP. EMS reports no stroke like symptoms with them but did have an erratic heart rate, mostly tachycardic 120's or so with what appeared some VT as well.  Initially hypotensive in the ER, started on levophed that has since been weaned to off with stable BP.   Cardiology was consulted, given fairly new implant CXR reviewed and noting RV lead had dislodged/pulled back towards the TV and EP was asked to see as well.  Drs. Jose Chase and Jose Chase at bedside, mentation intact well, no clear neuro deficits, CT brain without acute findings.  The patient reports he felt well until today, wife agrees.  LABS K+ 3.0 > 3.1 BUN/Creat 13/1.00 WBC 10.6 H/H 12/43 > 15/44 Plts 354   Past Medical History:  Diagnosis Date   Aspirin long-term use    DM (diabetes mellitus) ([place])    Dyspnea    GERD (gastroesophageal reflux disease)    Hemorrhoids, internal 10/13/2007   Hiatal hernia DEC 2008 LARGE   Prostate cancer [place])     Past Surgical History:  Procedure Laterality Date   BACK SURGERY     COLONOSCOPY  DEC 2025   COMPLICATED BY CONTAINED RECTAL PERFORATION   ESOPHAGOGASTRODUODENOSCOPY  05/29/2011   Procedure: ESOPHAGOGASTRODUODENOSCOPY (EGD);  Surgeon: Dorothyann Peng, MD;  [facility];  Service: Endoscopy;  Laterality: N/A;  Possible ED    ESOPHAGOGASTRODUODENOSCOPY N/A 02/07/2016   Procedure: ESOPHAGOGASTRODUODENOSCOPY (EGD);  Surgeon: Danie Binder, MD;  [facility];  Service: Endoscopy;  Laterality: N/A;  200 - moved to 3/28 @ 10:30 - office notified pt   ESOPHAGOGASTRODUODENOSCOPY (EGD) WITH ESOPHAGEAL DILATION N/A 10/20/2013   [facility]:[id number] at the gastroesophageal junction/ Medium sized hiatal hernia/MILD Erosive gastritis. Biopsy showed chronic inflammation, no H. pylori, fragment of fundic gland polyp   MALONEY DILATION  05/29/2011   Procedure: MALONEY DILATION;  Surgeon: Dorothyann Peng, MD;  [facility];  Service: Endoscopy;  Laterality: N/A;   PACEMAKER IMPLANT N/A 08/13/2022   Procedure: PACEMAKER IMPLANT;  Surgeon: Vickie Epley, MD;  Location: [place] [facility];  Service: Cardiovascular;  Laterality: N/A;   PROSTATE SURGERY     prostate cancer   SAVORY DILATION  05/29/2011   Procedure: SAVORY DILATION;  Surgeon: Dorothyann Peng, MD;  [facility];  Service: Endoscopy;  Laterality: N/A;   SAVORY DILATION N/A 02/07/2016   Procedure: SAVORY DILATION;  Surgeon: Danie Binder, MD;  [facility];  Service: Endoscopy;  Laterality: N/A;   UPPER GASTROINTESTINAL ENDOSCOPY  DEC 2008   TD:VVOHYW GASTRIC MUCOSA     Home Medications:  Prior to Admission medications   Medication Sig Start Date End Date Taking? Authorizing Provider  acetaminophen (TYLENOL) 500 MG tablet Take 500 mg by mouth every 6 (six) hours as needed for mild  pain or moderate pain.    [provider]  ALLERGY RELIEF 10 MG tablet Take 10 mg by mouth daily. 02/20/22   [provider]  gabapentin (NEURONTIN) 100 MG capsule Take 100 mg by mouth at bedtime. 04/25/22   [provider]  glipiZIDE (GLUCOTROL) 10 MG tablet Take 10 mg by mouth daily before breakfast.    [provider]  metFORMIN (GLUCOPHAGE) 850 MG tablet Take 1 tablet (850 mg total) by mouth 2 (two) times daily. 08/15/22    Mamie Levers, NP  Multiple Vitamins-Minerals (MEGA MULTI MEN PO) Take 1 tablet by mouth daily.    [provider]  omeprazole (PRILOSEC) 40 MG capsule Take 1 capsule (40 mg total) by mouth daily. 07/13/22   Eloise Harman, DO  simvastatin (ZOCOR) 20 MG tablet Take 20 mg by mouth at bedtime.      [provider]    Inpatient Medications: Scheduled Meds:  chlorhexidine  60 mL Topical Once   chlorhexidine  60 mL Topical Once   gentamicin (GARAMYCIN) 80 mg in sodium chloride 0.9 % 500 mL irrigation  80 mg Irrigation On Call   sodium chloride flush  3 mL Intravenous Q12H   Continuous Infusions:  sodium chloride     sodium chloride      ceFAZolin (ANCEF) IV     norepinephrine (LEVOPHED) Adult infusion Stopped (10/03/22 1619)   potassium chloride     PRN Meds: sodium chloride flush  Allergies:   No Known Allergies  Social History:   Social History   Socioeconomic History   Marital status: Married    Spouse name: Not on file   Number of children: Not on file   Years of education: Not on file   Highest education level: Not on file  Occupational History   Not on file  Tobacco Use   Smoking status: Former    Types: Cigarettes    Quit date: 01/11/1973    Years since quitting: 49.7   Smokeless tobacco: Never   Tobacco comments:    Never smoked very much. Occasional smoker-once or twice a week. Quit in 1974.  Vaping Use   Vaping Use: Never used  Substance and Sexual Activity   Alcohol use: No    Alcohol/week: 0.0 standard drinks of alcohol   Drug use: No   Sexual activity: Not Currently  Other Topics Concern   Not on file  Social History Narrative   Not on file   Social Determinants of Health   Financial Resource Strain: Not on file  Food Insecurity: Not on file  Transportation Needs: Not on file  Physical Activity: Not on file  Stress: Not on file  Social Connections: Not on file  Intimate Partner Violence: Not on file    Family History:    Family History  Problem Relation Age of Onset   Heart disease Father    Heart disease Brother    Cancer Brother    Cancer Brother    Cancer Other    Cancer Sister    Colon cancer Neg Hx    Colon polyps Neg Hx      ROS:  Please see the history of present illness.  All other ROS reviewed and negative.     Physical Exam/Data:   Vitals:   10/03/22 1600 10/03/22 1610 10/03/22 1615 10/03/22 1620  BP: 122/77 124/89 124/85 117/77  Pulse: 81  80   Resp: (!) '22 20 20 20  '$ Temp:      SpO2:  100%  100%    No intake or output data in the 24 hours ending 10/03/22 1633    09/14/2022    7:52 AM 08/28/2022    1:46 PM 08/14/2022    5:17 AM  Last 3 Weights  Weight (lbs) 150 lb 6.4 oz 152 lb 142 lb 4.8 oz  Weight (kg) 68.221 kg 68.947 kg 64.547 kg     There is no height or weight on file to calculate BMI.  General:  Thin, chronically ill in appearance, in no acute distress HEENT: normal, no teeth Neck: no JVD Vascular: No carotid bruits Cardiac:  RRR; no murmurs, gallops or rubs Lungs:  CTA b/l, no wheezing, rhonchi or rales  Abd: soft, nontender  Ext: no edema Musculoskeletal:  No deformities, advanced atrophy Skin: warm and dry  Neuro:  no focal abnormalities noted Psych:  Normal affect   EKG:  The EKG was personally reviewed and demonstrates:    VPaced 114  Telemetry:  Telemetry was personally reviewed and demonstrates:   Currently SR with intact conduction with pacing off (back up)  Relevant CV Studies:  08/13/22: TTE 1. Left ventricular ejection fraction, by estimation, is 55 to 60%. The  left ventricle has normal function. The left ventricle has no regional  wall motion abnormalities. There is mild left ventricular hypertrophy.  Left ventricular diastolic parameters  are consistent with Grade I diastolic dysfunction (impaired relaxation).   2. Right ventricular systolic function is normal. The right ventricular  size is normal. There is normal pulmonary artery  systolic pressure.   3. The mitral valve is normal in structure. No evidence of mitral valve  regurgitation. No evidence of mitral stenosis.   4. The aortic valve is tricuspid. Aortic valve regurgitation is not  visualized. Aortic valve sclerosis is present, with no evidence of aortic  valve stenosis.   Laboratory Data:  High Sensitivity Troponin:   Recent Labs  Lab 10/03/22 1259  TROPONINIHS 9     Chemistry Recent Labs  Lab 10/03/22 1259 10/03/22 1308  NA 141 143  K 3.0* 3.1*  CL 106 107  CO2 21*  --   GLUCOSE 200* 199*  BUN 11 13  CREATININE 1.15 1.00  CALCIUM 9.1  --   GFRNONAA >60  --   ANIONGAP 14  --     Recent Labs  Lab 10/03/22 1259  PROT 7.6  ALBUMIN 3.6  AST 34  ALT 15  ALKPHOS 77  BILITOT 0.6   Lipids No results for input(s): "CHOL", "TRIG", "HDL", "LABVLDL", "LDLCALC", "CHOLHDL" in the last 168 hours.  Hematology Recent Labs  Lab 10/03/22 1259 10/03/22 1308  WBC 10.6*  --   RBC 5.75  --   HGB 12.2* 15.0  HCT 42.3 44.0  MCV 73.6*  --   MCH 21.2*  --   MCHC 28.8*  --   RDW 21.1*  --   PLT 354  --    Thyroid No results for input(s): "TSH", "FREET4" in the last 168 hours.  BNPNo results for input(s): "BNP", "PROBNP" in the last 168 hours.  DDimer No results for input(s): "DDIMER" in the last 168 hours.   Radiology/Studies:  CT Head Wo Contrast Result Date: 10/03/2022 CLINICAL DATA:  Neurologic deficit, stroke symptoms EXAM: CT HEAD WITHOUT CONTRAST TECHNIQUE: Contiguous axial images were obtained from the base of the skull through the vertex without intravenous contrast. RADIATION DOSE REDUCTION: This exam was performed according to the departmental dose-optimization program which includes automated exposure control, adjustment of  the mA and/or kV according to patient size and/or use of iterative reconstruction technique. COMPARISON:  None Available. FINDINGS: Brain: Scattered hypodensities within the periventricular white matter are compatible  with age-indeterminate small vessel ischemic changes. No other signs of acute infarct or hemorrhage. The lateral ventricles and midline structures are unremarkable. No acute extra-axial fluid collections. No mass effect. Vascular: No hyperdense vessel or unexpected calcification. Skull: Normal. Negative for fracture or focal lesion. Sinuses/Orbits: No acute finding. Other: None. IMPRESSION: 1. Age indeterminate small vessel ischemic changes in the white matter, likely chronic. 2. Otherwise no acute intracranial process. Electronically Signed   By: Randa Ngo M.D.   On: 10/03/2022 13:57   DG Chest Port 1 View Result Date: 10/03/2022 CLINICAL DATA:  Shortness of breath. EXAM: PORTABLE CHEST 1 VIEW COMPARISON:  August 14, 2022. FINDINGS: The heart size and mediastinal contours are within normal limits. Stable position of left-sided pacemaker. Both lungs are clear. The visualized skeletal structures are unremarkable. IMPRESSION: No active disease. Electronically Signed   By: Marijo Conception M.D.   On: 10/03/2022 13:22     Assessment and Plan:   Fall vs syncope Hypotension resolved Speech difficulty Resolved 4.    VT (by report) Likely lead provoked   His RV lead has pulled back with gross lead dislodgement by CXR His MS/speech back to baseline, likely hypotension 2/2 either LOC and bradycardia, or lead provoked VT.  BP is better He has conduction currently His device programmed VVI 30 with good capture but poor sensing. Outputs increased to 5v/1.78m and sensing reduced  He is being brought to the cath lab for lead revision  Dr. LQuentin Orediscussed with the patient and his family bedside, they are all agreeable   Risk Assessment/Risk Scores:   For questions or updates, please contact [place] consult [email or web address] for contact info under    Signed, RBaldwin Jamaica PA-C  10/03/2022 4:33 PM

## 2022-10-03 NOTE — ED Notes (Signed)
ED Provider notified of patient going to cath lab per cardiolgoist. Provider also notified of RN concern of dysarthria continuance.

## 2022-10-03 NOTE — ED Triage Notes (Addendum)
Pt BIB EMS, called out for poss stroke sx, on arrival EMS noted no neuro deficits aside from speech changes. LSN 0700 today. EMS noted HR to be ranging from 130-200 with normal mentation. EMS reports witnessed episode of sustained Vtach with a rate of 220. Pt self converted prior to intervention. SpO2 dropped to 66% during this episode. Last BP 60/28 manual. Given 848m NS. Pt remains tachy. Pacemaker placed 6 weeks ago.

## 2022-10-03 NOTE — H&P (Addendum)
Cardiology Admission History and Physical   Patient ID: JARRIN STALEY MRN: [id number]; DOB: April 16, 1932   Admission date: 10/03/2022  PCP:  Carrolyn Meiers, [place] [facility] Cardiologist:  Dr. Quentin Ore    Chief Complaint:  weakness  Patient Profile:   Jose Chase is a 88 y.o. male with orthostatic hypotension, DM2, history of esophageal stricture, prostate cancer and history of complete heart block s/p Abbott dual-chamber PPM on 08/13/2022 who is being seen 10/03/2022 for the evaluation of tachycardia and weakness.  History of Present Illness:   Jose Chase is a 88 year old male with past medical history of orthostatic hypotension, DM2, history of esophageal stricture, prostate cancer and history of complete heart block s/p Abbott dual-chamber PPM on 08/13/2022.  Initially after pacemaker implantation, he was doing well.  He was in his normal state of health last night when he went to sleep.  However this morning, when he woke up, he felt very weak.  He felt his heart was racing.  He tried to get up however fell onto the floor.  Family member found him on the floor with dysarthria.  When EMS arrived, heart rate was noted to be ranging from 130-200.  There was report of possible V. tach.  Patient self converted prior to intervention.  O2 saturation dropped down to 66% during the episode.  Blood pressure was 60/28 on manual check.  [facility].  He was placed on norepinephrine to help maintain blood pressure.  EKG showed possible ventricularly paced rhythm with tachycardia, widened QRS.  Previous EKG prior to pacemaker implantation showed narrow QRS.  Initial CT of the head showed age-indeterminate small vessel ischemic changes in the white matter, likely chronic, otherwise no acute intracranial process.  Chest x-ray however shows possible lead dislodgment.  Blood culture was obtained x 2.  Cardiology service  was consulted for further evaluation.  Prior to allergy service arrival, patient was evaluated by PCCM, his mentation has significantly improved after placed on pressor.  PCCM signed off.   Past Medical History:  Diagnosis Date   Aspirin long-term use    DM (diabetes mellitus) ([place])    Dyspnea    GERD (gastroesophageal reflux disease)    Hemorrhoids, internal 10/13/2007   Hiatal hernia DEC 2008 LARGE   Prostate cancer [facility])     Past Surgical History:  Procedure Laterality Date   BACK SURGERY     COLONOSCOPY  DEC 3790   COMPLICATED BY CONTAINED RECTAL PERFORATION   ESOPHAGOGASTRODUODENOSCOPY  05/29/2011   Procedure: ESOPHAGOGASTRODUODENOSCOPY (EGD);  Surgeon: Dorothyann Peng, MD;  [facility];  Service: Endoscopy;  Laterality: N/A;  Possible ED   ESOPHAGOGASTRODUODENOSCOPY N/A 02/07/2016   Procedure: ESOPHAGOGASTRODUODENOSCOPY (EGD);  Surgeon: Danie Binder, MD;  Location: [facility];  Service: Endoscopy;  Laterality: N/A;  200 - moved to 3/28 @ 10:30 - office notified pt   ESOPHAGOGASTRODUODENOSCOPY (EGD) WITH ESOPHAGEAL DILATION N/A 10/20/2013   WIO:[id number] at the gastroesophageal junction/ Medium sized hiatal hernia/MILD Erosive gastritis. Biopsy showed chronic inflammation, no H. pylori, fragment of fundic gland polyp   MALONEY DILATION  05/29/2011   Procedure: MALONEY DILATION;  Surgeon: Dorothyann Peng, MD;  Location: [facility];  Service: Endoscopy;  Laterality: N/A;   PACEMAKER IMPLANT N/A 08/13/2022   Procedure: PACEMAKER IMPLANT;  Surgeon: Vickie Epley, MD;  Location: [facility];  Service: Cardiovascular;  Laterality: N/A;   PROSTATE SURGERY  prostate cancer   SAVORY DILATION  05/29/2011   Procedure: SAVORY DILATION;  Surgeon: Dorothyann Peng, MD;  [facility];  Service: Endoscopy;  Laterality: N/A;   SAVORY DILATION N/A 02/07/2016   Procedure: SAVORY DILATION;  Surgeon: Danie Binder, MD;  Location: [facility];  Service:  Endoscopy;  Laterality: N/A;   UPPER GASTROINTESTINAL ENDOSCOPY  DEC 2008   DD:[facility]     Medications Prior to Admission: Prior to Admission medications   Medication Sig Start Date End Date Taking? Authorizing Provider  acetaminophen (TYLENOL) 500 MG tablet Take 500 mg by mouth every 6 (six) hours as needed for mild pain or moderate pain.    [provider]  ALLERGY RELIEF 10 MG tablet Take 10 mg by mouth daily. 02/20/22   [provider]  gabapentin (NEURONTIN) 100 MG capsule Take 100 mg by mouth at bedtime. 04/25/22   [provider]  glipiZIDE (GLUCOTROL) 10 MG tablet Take 10 mg by mouth daily before breakfast.    [provider]  metFORMIN (GLUCOPHAGE) 850 MG tablet Take 1 tablet (850 mg total) by mouth 2 (two) times daily. 08/15/22   Mamie Levers, NP  Multiple Vitamins-Minerals (MEGA MULTI MEN PO) Take 1 tablet by mouth daily.    [provider]  omeprazole (PRILOSEC) 40 MG capsule Take 1 capsule (40 mg total) by mouth daily. 07/13/22   Eloise Harman, DO  simvastatin (ZOCOR) 20 MG tablet Take 20 mg by mouth at bedtime.      [provider]     Allergies:   No Known Allergies  Social History:   Social History   Socioeconomic History   Marital status: Married    Spouse name: Not on file   Number of children: Not on file   Years of education: Not on file   Highest education level: Not on file  Occupational History   Not on file  Tobacco Use   Smoking status: Former    Types: Cigarettes    Quit date: 01/11/1973    Years since quitting: 49.7   Smokeless tobacco: Never   Tobacco comments:    Never smoked very much. Occasional smoker-once or twice a week. Quit in 1974.  Vaping Use   Vaping Use: Never used  Substance and Sexual Activity   Alcohol use: No    Alcohol/week: 0.0 standard drinks of alcohol   Drug use: No   Sexual activity: Not Currently  Other Topics Concern   Not on file  Social History  Narrative   Not on file   Social Determinants of Health   Financial Resource Strain: Not on file  Food Insecurity: Not on file  Transportation Needs: Not on file  Physical Activity: Not on file  Stress: Not on file  Social Connections: Not on file  Intimate Partner Violence: Not on file    Family History:   The patient's family history includes Cancer in his brother, brother, sister, and another family member; Heart disease in his brother and father. There is no history of Colon cancer or Colon polyps.    ROS:  Please see the history of present illness.  All other ROS reviewed and negative.     Physical Exam/Data:   Vitals:   10/03/22 1600 10/03/22 1610 10/03/22 1615 10/03/22 1620  BP: 122/77 124/89 124/85 117/77  Pulse: 81  80   Resp: (!) '22 20 20 20  '$ Temp:      SpO2: 100%  100%  No intake or output data in the 24 hours ending 10/03/22 1630    09/14/2022    7:52 AM 08/28/2022    1:46 PM 08/14/2022    5:17 AM  Last 3 Weights  Weight (lbs) 150 lb 6.4 oz 152 lb 142 lb 4.8 oz  Weight (kg) 68.221 kg 68.947 kg 64.547 kg     There is no height or weight on file to calculate BMI.  General:  Well nourished, well developed, in no acute distress HEENT: normal Neck: no JVD Vascular: No carotid bruits; Distal pulses 2+ bilaterally   Cardiac:  normal S1, S2; RRR; no murmur  Lungs:  clear to auscultation bilaterally, no wheezing, rhonchi or rales  Abd: soft, nontender, no hepatomegaly  Ext: no edema Musculoskeletal:  No deformities, BUE and BLE strength normal and equal Skin: warm and dry  Neuro:  CNs 2-12 intact, no focal abnormalities noted Psych:  Normal affect    EKG:  The ECG that was done and was personally reviewed and demonstrates V paced rhythm with widened QRS  Relevant CV Studies:  Echo 08/13/2022 1. Left ventricular ejection fraction, by estimation, is 55 to 60%. The  left ventricle has normal function. The left ventricle has no regional  wall motion  abnormalities. There is mild left ventricular hypertrophy.  Left ventricular diastolic parameters  are consistent with Grade I diastolic dysfunction (impaired relaxation).   2. Right ventricular systolic function is normal. The right ventricular  size is normal. There is normal pulmonary artery systolic pressure.   3. The mitral valve is normal in structure. No evidence of mitral valve  regurgitation. No evidence of mitral stenosis.   4. The aortic valve is tricuspid. Aortic valve regurgitation is not  visualized. Aortic valve sclerosis is present, with no evidence of aortic  valve stenosis.    Laboratory Data:  High Sensitivity Troponin:   Recent Labs  Lab 10/03/22 1259  TROPONINIHS 9      Chemistry Recent Labs  Lab 10/03/22 1259 10/03/22 1308  NA 141 143  K 3.0* 3.1*  CL 106 107  CO2 21*  --   GLUCOSE 200* 199*  BUN 11 13  CREATININE 1.15 1.00  CALCIUM 9.1  --   GFRNONAA >60  --   ANIONGAP 14  --     Recent Labs  Lab 10/03/22 1259  PROT 7.6  ALBUMIN 3.6  AST 34  ALT 15  ALKPHOS 77  BILITOT 0.6   Lipids No results for input(s): "CHOL", "TRIG", "HDL", "LABVLDL", "LDLCALC", "CHOLHDL" in the last 168 hours. Hematology Recent Labs  Lab 10/03/22 1259 10/03/22 1308  WBC 10.6*  --   RBC 5.75  --   HGB 12.2* 15.0  HCT 42.3 44.0  MCV 73.6*  --   MCH 21.2*  --   MCHC 28.8*  --   RDW 21.1*  --   PLT 354  --    Thyroid No results for input(s): "TSH", "FREET4" in the last 168 hours. BNPNo results for input(s): "BNP", "PROBNP" in the last 168 hours.  DDimer No results for input(s): "DDIMER" in the last 168 hours.   Radiology/Studies:  CT Head Wo Contrast  Result Date: 10/03/2022 CLINICAL DATA:  Neurologic deficit, stroke symptoms EXAM: CT HEAD WITHOUT CONTRAST TECHNIQUE: Contiguous axial images were obtained from the base of the skull through the vertex without intravenous contrast. RADIATION DOSE REDUCTION: This exam was performed according to the  departmental dose-optimization program which includes automated exposure control, adjustment of the mA and/or  kV according to patient size and/or use of iterative reconstruction technique. COMPARISON:  None Available. FINDINGS: Brain: Scattered hypodensities within the periventricular white matter are compatible with age-indeterminate small vessel ischemic changes. No other signs of acute infarct or hemorrhage. The lateral ventricles and midline structures are unremarkable. No acute extra-axial fluid collections. No mass effect. Vascular: No hyperdense vessel or unexpected calcification. Skull: Normal. Negative for fracture or focal lesion. Sinuses/Orbits: No acute finding. Other: None. IMPRESSION: 1. Age indeterminate small vessel ischemic changes in the white matter, likely chronic. 2. Otherwise no acute intracranial process. Electronically Signed   By: Randa Ngo M.D.   On: 10/03/2022 13:57   DG Chest Port 1 View  Result Date: 10/03/2022 CLINICAL DATA:  Shortness of breath. EXAM: PORTABLE CHEST 1 VIEW COMPARISON:  August 14, 2022. FINDINGS: The heart size and mediastinal contours are within normal limits. Stable position of left-sided pacemaker. Both lungs are clear. The visualized skeletal structures are unremarkable. IMPRESSION: No active disease. Electronically Signed   By: Marijo Conception M.D.   On: 10/03/2022 13:22     Assessment and Plan:   Weakness with dysarthria: CT of the head showed no acute process.  Will discuss with MD regarding obtaining MRI of the brain.  Likely related to severe hypotension and pacemaker dislodgment  Hypotension: Required temporary pressor, norepinephrine has since weaned off.    Pacemaker lead dislodgment: Seen by EP service, plan to take to Cath Lab today for lead exchange  DM2  Orthostatic hypotension: Will continue IV fluid   Risk Assessment/Risk Scores:         Severity of Illness: The appropriate patient status for this patient is OBSERVATION.  Observation status is judged to be reasonable and necessary in order to provide the required intensity of service to ensure the patient's safety. The patient's presenting symptoms, physical exam findings, and initial radiographic and laboratory data in the context of their medical condition is felt to place them at decreased risk for further clinical deterioration. Furthermore, it is anticipated that the patient will be medically stable for discharge from the hospital within 2 midnights of admission.    For questions or updates, please contact [place] Please consult [email or web address] for contact info under     Signed, Almyra Deforest, [place]  10/03/2022 4:30 PM    I have seen and examined the patient along with Almyra Deforest, PA .  I have reviewed the chart, notes and new data.  I agree with PA/NP's note.  Key new complaints: asymptomatic right now, had syncope and rapid palpitations earlier Key examination changes: normal CV exam except faint aortic ejection murmur Key new findings / data: ECG shows A sense V paced rhythm, but the QRS is broader than on the post implant tracing. CXR shows change in RV lead position, now parallel with RV floor, tip probably just beyond tricuspid annulus, rather than pointing upwards to ventricular septum. Device interrogation shows remarkably normal impedance (630 ohm, close to previous range) and pacing threshold (1.0V'@0'$ .23m), but there is clear evidence of undersensing and tested R waves are around 1 mV  PLAN: Findings are consistent with RV lead dislodgement, thankfully still in contact with RV endocardium. Turned down V sensitivity to 0.5 mV and turned RV lead output high. Plan for lead revision today.  MSanda Klein MD, [phone]/22/2023, 4:53 PM

## 2022-10-03 NOTE — ED Notes (Signed)
Per critical care, titrate down levo and start more fluids

## 2022-10-03 NOTE — ED Provider Notes (Signed)
[facility] EMERGENCY DEPARTMENT Provider Note   CSN: [id number] Arrival date & time: 10/03/22  1247     History  Chief Complaint  Patient presents with   Tachycardia    Jose Chase is a 88 y.o. male.  Patient is a 88 year old male who presents with weakness and possible stroke.  History is a bit confusing.  Per EMS and later from history from family, patient this morning and sat on the floor and felt very weak.  He was having a hard time speaking.  His wife says he does have a history of dysphagia and he has some speech deficits at baseline but she thought it was a little bit worse than normal.  The patient does say he is having a little harder time with the speech today.  He felt profusely weak.  He does report he was having some chest pain and shortness of breath earlier but denies any currently.  No abdominal pain.  No recent illnesses.  No cough or fevers.  He says he was feeling okay yesterday.  Per EMS, they did not find any strokelike symptoms other than the slurred speech but they did note that his heart rate was erratic.  It was mostly tachycardic and ranging between 130s to 200s.  They report that he had some runs of V. tach although they self converted prior to treatment.  He got some IV fluids but otherwise did not get medications by EMS.       Home Medications Prior to Admission medications   Medication Sig Start Date End Date Taking? Authorizing Provider  acetaminophen (TYLENOL) 500 MG tablet Take 500 mg by mouth every 6 (six) hours as needed for mild pain or moderate pain.    [provider]  ALLERGY RELIEF 10 MG tablet Take 10 mg by mouth daily. 02/20/22   [provider]  gabapentin (NEURONTIN) 100 MG capsule Take 100 mg by mouth at bedtime. 04/25/22   [provider]  glipiZIDE (GLUCOTROL) 10 MG tablet Take 10 mg by mouth daily before breakfast.    [provider]  metFORMIN (GLUCOPHAGE) 850 MG tablet Take 1  tablet (850 mg total) by mouth 2 (two) times daily. 08/15/22   Mamie Levers, NP  Multiple Vitamins-Minerals (MEGA MULTI MEN PO) Take 1 tablet by mouth daily.    [provider]  omeprazole (PRILOSEC) 40 MG capsule Take 1 capsule (40 mg total) by mouth daily. 07/13/22   Eloise Harman, DO  simvastatin (ZOCOR) 20 MG tablet Take 20 mg by mouth at bedtime.      [provider]      Allergies    Patient has no known allergies.    Review of Systems   Review of Systems  Constitutional:  Positive for fatigue. Negative for chills, diaphoresis and fever.  HENT:  Negative for congestion, rhinorrhea and sneezing.   Eyes: Negative.   Respiratory:  Positive for shortness of breath. Negative for cough and chest tightness.   Cardiovascular:  Positive for chest pain. Negative for leg swelling.  Gastrointestinal:  Negative for abdominal pain, blood in stool, diarrhea, nausea and vomiting.  Genitourinary:  Negative for difficulty urinating, flank pain, frequency and hematuria.  Musculoskeletal:  Negative for arthralgias and back pain.  Skin:  Negative for rash.  Neurological:  Positive for speech difficulty and weakness (Generalized). Negative for dizziness, numbness and headaches.    Physical Exam Updated Vital Signs BP 117/80   Pulse 67   Temp Marland Kitchen)  96.4 F (35.8 C)   Resp 18   SpO2 100%  Physical Exam Constitutional:      General: He is in acute distress.     Appearance: He is well-developed.  HENT:     Head: Normocephalic and atraumatic.  Eyes:     Pupils: Pupils are equal, round, and reactive to light.  Cardiovascular:     Rate and Rhythm: Regular rhythm. Tachycardia present.     Heart sounds: Normal heart sounds.  Pulmonary:     Effort: Pulmonary effort is normal. No respiratory distress.     Breath sounds: Normal breath sounds. No wheezing or rales.  Chest:     Chest wall: No tenderness.  Abdominal:     General: Bowel sounds are normal.     Palpations: Abdomen  is soft.     Tenderness: There is no abdominal tenderness. There is no guarding or rebound.  Musculoskeletal:        General: Normal range of motion.     Cervical back: Normal range of motion and neck supple.  Lymphadenopathy:     Cervical: No cervical adenopathy.  Skin:    General: Skin is warm and dry.     Findings: No rash.  Neurological:     Mental Status: He is alert and oriented to person, place, and time.     Comments: Motor 5/5 all extremities Sensation grossly intact to LT all extremities  no pronator drift No facial drooping, slight circular ring of the speech.  No aphasia  Visual fields full to confrontation      ED Results / Procedures / Treatments   Labs (all labs ordered are listed, but only abnormal results are displayed) Labs Reviewed  CBC WITH DIFFERENTIAL/PLATELET - Abnormal; Notable for the following components:      Result Value   WBC 10.6 (*)    Hemoglobin 12.2 (*)    MCV 73.6 (*)    MCH 21.2 (*)    MCHC 28.8 (*)    RDW 21.1 (*)    Neutro Abs 8.2 (*)    All other components within normal limits  COMPREHENSIVE METABOLIC PANEL - Abnormal; Notable for the following components:   Potassium 3.0 (*)    CO2 21 (*)    Glucose, Bld 200 (*)    All other components within normal limits  I-STAT CHEM 8, ED - Abnormal; Notable for the following components:   Potassium 3.1 (*)    Glucose, Bld 199 (*)    All other components within normal limits  CULTURE, BLOOD (ROUTINE X 2)  CULTURE, BLOOD (ROUTINE X 2)  PROTIME-INR  LACTIC ACID, PLASMA  LACTIC ACID, PLASMA  RAPID URINE DRUG SCREEN, HOSP PERFORMED  TROPONIN I (HIGH SENSITIVITY)  TROPONIN I (HIGH SENSITIVITY)    EKG EKG Interpretation  Date/Time:  Wednesday October 03 2022 13:05:43 EST Ventricular Rate:  111 PR Interval:    QRS Duration: 146 QT Interval:  448 QTC Calculation: 609 R Axis:   -63 Text Interpretation: Atrial fibrillation Left bundle branch block Baseline wander in lead(s) V5 V6  Partial missing lead(s): V5 Confirmed by Malvin Johns [phone]) on 10/03/2022 1:08:38 PM  Radiology CT Head Wo Contrast  Result Date: 10/03/2022 CLINICAL DATA:  Neurologic deficit, stroke symptoms EXAM: CT HEAD WITHOUT CONTRAST TECHNIQUE: Contiguous axial images were obtained from the base of the skull through the vertex without intravenous contrast. RADIATION DOSE REDUCTION: This exam was performed according to the departmental dose-optimization program which includes automated exposure control, adjustment of the  mA and/or kV according to patient size and/or use of iterative reconstruction technique. COMPARISON:  None Available. FINDINGS: Brain: Scattered hypodensities within the periventricular white matter are compatible with age-indeterminate small vessel ischemic changes. No other signs of acute infarct or hemorrhage. The lateral ventricles and midline structures are unremarkable. No acute extra-axial fluid collections. No mass effect. Vascular: No hyperdense vessel or unexpected calcification. Skull: Normal. Negative for fracture or focal lesion. Sinuses/Orbits: No acute finding. Other: None. IMPRESSION: 1. Age indeterminate small vessel ischemic changes in the white matter, likely chronic. 2. Otherwise no acute intracranial process. Electronically Signed   By: Randa Ngo M.D.   On: 10/03/2022 13:57   DG Chest Port 1 View  Result Date: 10/03/2022 CLINICAL DATA:  Shortness of breath. EXAM: PORTABLE CHEST 1 VIEW COMPARISON:  August 14, 2022. FINDINGS: The heart size and mediastinal contours are within normal limits. Stable position of left-sided pacemaker. Both lungs are clear. The visualized skeletal structures are unremarkable. IMPRESSION: No active disease. Electronically Signed   By: Marijo Conception M.D.   On: 10/03/2022 13:22    Procedures Procedures    Medications Ordered in ED Medications  norepinephrine (LEVOPHED) '4mg'$  in 249m (0.016 mg/mL) premix infusion (5 mcg/min Intravenous  New Bag/Given 10/03/22 1311)  potassium chloride (KLOR-CON) packet 40 mEq (has no administration in time range)  sodium chloride 0.9 % bolus 500 mL (has no administration in time range)  sodium chloride 0.9 % bolus 500 mL (0 mLs Intravenous Stopped 10/03/22 1353)    ED Course/ Medical Decision Making/ A&P                           Medical Decision Making Amount and/or Complexity of Data Reviewed Labs: ordered. Radiology: ordered.  Risk Prescription drug management.   Patient is a 88year old who presents with hypotension.  Initially is noted to be in what appears to be A-fib with RVR.  Although his heart rate had slowed considerably from what it had been per EMS.  EMS reported a run of V. tach although they do not have rhythm strips to demonstrate this.  His heart rate was in the 110s on arrival and he was markedly hypotensive with blood pressures in the 60s and 70s.  I did not feel that the hypotension was related related to the A-fib given that it was not markedly elevated and rate.  For this reason he was not cardioverted.  He was started on IV fluids and Levophed.  There is no suggestions of ACS.  His labs are grossly nonconcerning.  His potassium is little low at 3.0.  He is not markedly anemic.  He does have a little bit of slurring of his speech.  Reportedly his last known normal was 7 AM this morning although EMS reported that he had had some symptoms yesterday as well.  Even if the last known normal was 7 AM, he is out of the window for stroke activation.  He does not have any signs of LVO.  His head CT does not show any acute abnormality.  He may need an MRI once more stabilized.  No obvious signs of tamponade on bedside u/s.  No fever or other infectious type symptoms.  I discussed the findings with critical care who will admit the patient for further treatment.  CRITICAL CARE Performed by: MMalvin JohnsTotal critical care time: 80 minutes Critical care time was exclusive of  separately billable procedures and treating other patients. Critical  care was necessary to treat or prevent imminent or life-threatening deterioration. Critical care was time spent personally by me on the following activities: development of treatment plan with patient and/or surrogate as well as nursing, discussions with consultants, evaluation of patient's response to treatment, examination of patient, obtaining history from patient or surrogate, ordering and performing treatments and interventions, ordering and review of laboratory studies, ordering and review of radiographic studies, pulse oximetry and re-evaluation of patient's condition.   Final Clinical Impression(s) / ED Diagnoses Final diagnoses:  Atrial fibrillation with RVR (HCC)  Hypotension, unspecified hypotension type    Rx / DC Orders ED Discharge Orders     None         Malvin Johns, MD 10/03/22 1605

## 2022-10-04 ENCOUNTER — Observation Stay (HOSPITAL_COMMUNITY): Payer: Medicare Other

## 2022-10-04 DIAGNOSIS — Z7984 Long term (current) use of oral hypoglycemic drugs: Secondary | ICD-10-CM | POA: Diagnosis not present

## 2022-10-04 DIAGNOSIS — I442 Atrioventricular block, complete: Secondary | ICD-10-CM | POA: Diagnosis not present

## 2022-10-04 DIAGNOSIS — R001 Bradycardia, unspecified: Secondary | ICD-10-CM | POA: Diagnosis not present

## 2022-10-04 DIAGNOSIS — R2689 Other abnormalities of gait and mobility: Secondary | ICD-10-CM | POA: Diagnosis not present

## 2022-10-04 DIAGNOSIS — G9340 Encephalopathy, unspecified: Secondary | ICD-10-CM | POA: Diagnosis not present

## 2022-10-04 DIAGNOSIS — Z950 Presence of cardiac pacemaker: Secondary | ICD-10-CM | POA: Diagnosis not present

## 2022-10-04 DIAGNOSIS — E119 Type 2 diabetes mellitus without complications: Secondary | ICD-10-CM | POA: Diagnosis not present

## 2022-10-04 DIAGNOSIS — I951 Orthostatic hypotension: Secondary | ICD-10-CM | POA: Diagnosis not present

## 2022-10-04 DIAGNOSIS — R531 Weakness: Secondary | ICD-10-CM | POA: Diagnosis not present

## 2022-10-04 DIAGNOSIS — Z8546 Personal history of malignant neoplasm of prostate: Secondary | ICD-10-CM | POA: Diagnosis not present

## 2022-10-04 DIAGNOSIS — Z87891 Personal history of nicotine dependence: Secondary | ICD-10-CM | POA: Diagnosis not present

## 2022-10-04 DIAGNOSIS — T82120A Displacement of cardiac electrode, initial encounter: Secondary | ICD-10-CM | POA: Diagnosis not present

## 2022-10-04 LAB — BASIC METABOLIC PANEL
Anion gap: 9 (ref 5–15)
BUN: 13 mg/dL (ref 8–23)
CO2: 23 mmol/L (ref 22–32)
Calcium: 8.8 mg/dL — ABNORMAL LOW (ref 8.9–10.3)
Chloride: 106 mmol/L (ref 98–111)
Creatinine, Ser: 0.98 mg/dL (ref 0.61–1.24)
GFR, Estimated: >60 mL/min (ref >60)
Glucose, Bld: 131 mg/dL — ABNORMAL HIGH (ref 70–99)
Potassium: 4 mmol/L (ref 3.5–5.1)
Sodium: 138 mmol/L (ref 135–145)

## 2022-10-04 LAB — GLUCOSE, CAPILLARY
Glucose-Capillary: 135 mg/dL — ABNORMAL HIGH (ref 70–99)
Glucose-Capillary: 165 mg/dL — ABNORMAL HIGH (ref 70–99)

## 2022-10-04 MED ORDER — SODIUM CHLORIDE 0.9 % IV BOLUS
500.0000 mL | Freq: Once | INTRAVENOUS | Status: AC
  Administered 2022-10-04: 500 mL via INTRAVENOUS

## 2022-10-04 NOTE — Discharge Instructions (Addendum)
Per Dr. Quentin Ore, keep your arm in the sling for 4 DAYS!  Sleep in your recliner for 4 weeks!   Supplemental Discharge Instructions for  Pacemaker Patients  Activity No heavy lifting or vigorous activity with your left/right arm for 6 to 8 weeks.  Do not raise your left/right arm above your head for one week.  Gradually raise your affected arm as drawn below.           _         11/27                         11/28                         11/29                      11/30 _  NO DRIVING until cleared by your provider at follow-up. Please discuss with the electrical team at your wound check appointment.  WOUND CARE Keep the wound area clean and dry.  Do not get this area wet for one week. No showers for one week; you may shower on 10/10/2022    . The tape/steri-strips on your wound will fall off; do not pull them off.  No bandage is needed on the site.  DO  NOT apply any creams, oils, or ointments to the wound area. If you notice any drainage or discharge from the wound, any swelling or bruising at the site, or you develop a fever > 101? F after you are discharged home, call the office at once.  Special Instructions You are still able to use cellular telephones; use the ear opposite the side where you have your pacemaker.  Avoid carrying your cellular phone near your device. When traveling through airports, show security personnel your identification card to avoid being screened in the metal detectors.  Ask the security personnel to use the hand wand. Avoid arc welding equipment, MRI testing (magnetic resonance imaging), TENS units (transcutaneous nerve stimulators).  Call the office for questions about other devices. Avoid electrical appliances that are in poor condition or are not properly grounded. Microwave ovens are safe to be near or to operate.

## 2022-10-04 NOTE — Evaluation (Signed)
Physical Therapy Evaluation Patient Details Name: Jose Chase MRN: [id number] DOB: 08-22-1932 Today's Date: 10/04/2022  History of Present Illness  88 y.o. male presents to [facility] hospital on 10/03/2022 with tachycardia and weakness. Pt found to have pacemaker lead dislodgement, lead revision on 11/22. PMH includes orthostatic hypotension, DMII, esophageal stricture, prostate cancer.  Clinical Impression  Pt presents to PT at or near his reported baseline. Pt is able to briskly ambulate without use of an assistive device, as well as negotiate stairs. PT provides reinforcement of PPM precautions and sling use. PT recommending outpatient PT to provide continued opportunity for strengthening. Pt requires no further acute PT services at this time.       Recommendations for follow up therapy are one component of a multi-disciplinary discharge planning process, led by the attending physician.  Recommendations may be updated based on patient status, additional functional criteria and insurance authorization.  Follow Up Recommendations Outpatient PT      Assistance Recommended at Discharge PRN  Patient can return home with the following  A little help with bathing/dressing/bathroom;Assist for transportation    Equipment Recommendations None recommended by PT  Recommendations for Other Services       Functional Status Assessment Patient has not had a recent decline in their functional status     Precautions / Restrictions Precautions Precautions: ICD/Pacemaker Required Braces or Orthoses: Sling Restrictions Weight Bearing Restrictions: Yes LUE Weight Bearing: Non weight bearing Other Position/Activity Restrictions: NWB PPM precautions      Mobility  Bed Mobility Overal bed mobility: Modified Independent             General bed mobility comments: increased time    Transfers Overall transfer level: Independent                      Ambulation/Gait Ambulation/Gait  assistance: Independent Gait Distance (Feet): 400 Feet Assistive device: None Gait Pattern/deviations: Step-through pattern Gait velocity: functional Gait velocity interpretation: >2.62 ft/sec, indicative of community ambulatory   General Gait Details: steady step-through gait  Stairs Stairs: Yes Stairs assistance: Supervision Stair Management: One rail Right, Step to pattern Number of Stairs: 4 General stair comments: limited by IV length  Wheelchair Mobility    Modified Rankin (Stroke Patients Only)       Balance Overall balance assessment: Mild deficits observed, not formally tested                                           Pertinent Vitals/Pain Pain Assessment Pain Assessment: No/denies pain    Home Living Family/patient expects to be discharged to:: Private residence Living Arrangements: Spouse/significant other Available Help at Discharge: Family;Available 24 hours/day (spouse supervision only, gdtr lives next door) Type of Home: House Home Access: Stairs to enter Entrance Stairs-Rails: Right Entrance Stairs-Number of Steps: 6   Home Layout: One level Home Equipment: Cane - single point;Shower seat;Grab bars - toilet      Prior Function Prior Level of Function : Independent/Modified Independent;Driving             Mobility Comments: PRN use of cane for community mobility       Hand Dominance   Dominant Hand: Right    Extremity/Trunk Assessment   Upper Extremity Assessment Upper Extremity Assessment: LUE deficits/detail LUE Deficits / Details: restricted 2/2 PPM precautions    Lower Extremity Assessment Lower Extremity Assessment: Overall [facility]  for tasks assessed    Cervical / Trunk Assessment Cervical / Trunk Assessment: Normal  Communication   Communication: No difficulties  Cognition Arousal/Alertness: Awake/alert Behavior During Therapy: WFL for tasks assessed/performed Overall Cognitive Status: Within Functional  Limits for tasks assessed                                          General Comments General comments (skin integrity, edema, etc.): VSS on RA    Exercises     Assessment/Plan    PT Assessment All further PT needs can be met in the next venue of care  PT Problem List Decreased activity tolerance;Decreased strength       PT Treatment Interventions      PT Goals (Current goals can be found in the Care Plan section)       Frequency       Co-evaluation               AM-PAC PT "6 Clicks" Mobility  Outcome Measure Help needed turning from your back to your side while in a flat bed without using bedrails?: None Help needed moving from lying on your back to sitting on the side of a flat bed without using bedrails?: None Help needed moving to and from a bed to a chair (including a wheelchair)?: None Help needed standing up from a chair using your arms (e.g., wheelchair or bedside chair)?: None Help needed to walk in hospital room?: None Help needed climbing 3-5 steps with a railing? : A Little 6 Click Score: 23    End of Session   Activity Tolerance: Patient tolerated treatment well Patient left: in bed;with call bell/phone within reach Nurse Communication: Mobility status PT Visit Diagnosis: Other abnormalities of gait and mobility (R26.89)    Time: 1674-2552 PT Time Calculation (min) (ACUTE ONLY): 32 min   Charges:   PT Evaluation $PT Eval Low Complexity: 1 Low          Zenaida Niece, PT, DPT Acute Rehabilitation Office [phone]   Zenaida Niece 10/04/2022, 4:02 PM

## 2022-10-04 NOTE — Progress Notes (Signed)
Cardiology Brief Update  Patient and family report he continues to have trouble with nutrition at home and with ambulation. They would like him to be evaluated by physical therapy prior to discharge to see if we can help with his strength and more general failure to thrive.  Will consult PT now.  No discharge today.  Lysbeth Galas T. Quentin Ore, MD, [facility], [facility] Cardiac Electrophysiology

## 2022-10-04 NOTE — Discharge Summary (Addendum)
Discharge Summary    Patient ID: Jose Chase MRN: [id number]; DOB: Feb 05, 1932  Admit date: 10/03/2022 Discharge date: 10/04/2022  PCP:  Carrolyn Meiers, [place] Providers Electrophysiologist:  Vickie Epley, MD       Discharge Diagnoses    Principal Problem:   Pacemaker lead malfunction Active Problems:   Hypotension   Encephalopathy acute   Diabetes mellitus type 2 in nonobese [facility])   Symptomatic bradycardia   Cardiac pacemaker in situ   Diagnostic Studies/Procedures    Lead Revision/Repair: 10/03/2022 DESCRIPTION OF PROCEDURE: Informed written consent was obtained and the patient was brought to the electrophysiology lab in the fasting state. No sedation was used during today's procedure. The patient's left chest was prepped and draped in the usual sterile fashion by the EP lab staff. The skin overlying the left deltopectoral region was infiltrated with lidocaine for local analgesia. An incision was created over the left deltopectoral region. The patient's existing device was delivered from the pocket. The RV lead was freed from the underlying fascia. On fluoroscopy, the patient's previously placed RV lead was grossly dislodged to the TV annulus.  Lead Placement: The left axillary vein was cannulated using ultrasound guidance. Through the left axillary vein, a new RV pace/sense lead was advanced to the RV apical septum where it displayed excelling pacing and sensing thresholds with an acceptable impedance. The lead was secured to the pectoral fascia. This lead was secured to the patient's existing generator. The helix on the patient's previously positioned RV lead was withdrawn and this lead was removed from the body with gentle traction. The generator was returned to the pocket within an antibiotic envelope.  The pocket was closed in layers of absorbable suture. EBL < 87m. Steri-strips and a sterile dressing were applied.  CONCLUSIONS:  1.  Complete heart block s/p dual chamber permanent pacemaker 2. Gross dislodgement of the previously placed RV lead 3. Placement of a new RV lead in the apical septum and removal of the previously positioned lead 4. No early apparent complications.    History of Present Illness     Jose AMBROSIUSis a 88y.o. male with past medical history of CHB (s/p Abbott Dual Chamber PPM placement on 08/13/2022), Type 2 DM, gastritis, esophageal stricture and prostate cancer who [facility] for evaluation of worsening weakness and dysarthria.    He reported feeling profoundly weak starting earlier in the day and reported having intermittent dyspnea as well. Was found be tachycardic with heart rate ranging from the 130's to 200's by EMS report with reports of some sustained VT but converted without intervention. Upon arrival to the ED, Critical Care was consulted as he was hypotensive with SBP in the 60's and received IV fluids. Initial Lactic Acid was at 5.6 with repeat of 4.2. Was initially started on low-dose Levophed and underwent Head CT which showed no acute intracranial abnormalities. CXR showed possible lead dislodgment of his PPM and Cardiology was consulted for further evaluation.  Hospital Course     Consultants: Critical Care  His EKG tracing was reviewed by Dr. CSallyanne Kusteron admission and showed an A-sensed, V-paced rhythm but the QRS was broader than postimplant tracing and CXR did show a change in the RV lead position which was now parallel with RV floor and to probably beyond the tricuspid annulus. Overall, this was consistent with RV lead dislodgment and was recommend to undergo lead revision that day.This was performed  by Dr. Quentin Ore on 10/03/2022 and he underwent placement of a new RV lead in the apical septum with removal of the previously positioned lead.   He was evaluated by Dr. Quentin Ore this morning and reported feeling back to baseline. He was alert and oriented with  complete resolution of his prior AMS. Repeat CXR showed no evidence of a pneumothorax and normal heart size. It was recommended by Dr. Quentin Ore for him to wear his arm sling for 4 days along with sleeping in a recliner for 4 weeks. Critical Care had previously ordered a Brain MRI on the patient but Dr. Quentin Ore did not feel that this was needed and his case was reviewed with Critical Care and they reported having signed off the day prior. He was hypotensive this morning though not associated with any acute mental status changes or complaints. This was reviewed with Dr. Quentin Ore and the patient received an additional 500 mL fluid bolus. AM coverage PA [place] spoke with critical care who did not feel any additional workup was needed and remained signed off the case. He was observed for several hours and BP steadily came back up closer to baseline. He ambulated with the nurse tech and remained asymptomatic. Dr. Quentin Ore did recommend he be formally seen by PT prior to discharge. We were able to accomplish this this afternoon. PT felt he was at his baseline and felt he was stable for discharge with a plan for outpatient PT for continued strengthening but no home health needs identified. Per follow-up discussion with MD based on PT assessment this afternoon, Dr. Quentin Ore felt he was stable for discharge home today. [place] sent a staff message to the office to arrange for a wound check and EP follow-up. We did include on his discharge paperwork not to drive until cleared in follow-up given the acute issues in the hospital. Would recommend to review with MD at wound check, along with timing of when he would be appropriate to refer for outpatient PT as he will still have activity restrictions in place as outlined on AVS due to his PPM procedure. He was discharged home in stable condition on PTA meds per MD.   _____________  Discharge Vitals Blood pressure (!) 99/52, pulse 63, temperature (!) 97.4 F (36.3 C),  temperature source Oral, resp. rate 14, SpO2 100 %.  There were no vitals filed for this visit.  Labs & Radiologic Studies    CBC Recent Labs    10/03/22 1259 10/03/22 1308  WBC 10.6*  --   NEUTROABS 8.2*  --   HGB 12.2* 15.0  HCT 42.3 44.0  MCV 73.6*  --   PLT 354  --    Basic Metabolic Panel Recent Labs    10/03/22 2145 10/04/22 0123  NA 141 138  K 4.5 4.0  CL 109 106  CO2 21* 23  GLUCOSE 170* 131*  BUN 12 13  CREATININE 0.98 0.98  CALCIUM 8.9 8.8*   Liver Function Tests Recent Labs    10/03/22 1259  AST 34  ALT 15  ALKPHOS 77  BILITOT 0.6  PROT 7.6  ALBUMIN 3.6   No results for input(s): "LIPASE", "AMYLASE" in the last 72 hours. High Sensitivity Troponin:   Recent Labs  Lab 10/03/22 1259 10/03/22 1559  TROPONINIHS 9 302*    _____________  DG Chest 2 View  Result Date: 10/04/2022 CLINICAL DATA:  Pacemaker. EXAM: CHEST - 2 VIEW COMPARISON:  10/03/2022 FINDINGS: Again noted is a dual lead left chest cardiac pacemaker.  There has been revision of the RV lead. Both lungs are clear. Heart size is normal. Negative for pneumothorax. No large pleural effusions. Elevation of the right humeral head and suspect underlying rotator cuff disease. IMPRESSION: 1. Left chest pacemaker with revision of RV lead. 2. No acute cardiopulmonary disease. Electronically Signed   By: Markus Daft M.D.   On: 10/04/2022 09:30   EP PPM/ICD IMPLANT  Result Date: 10/03/2022  CONCLUSIONS:  1. Complete heart block s/p dual chamber permanent pacemaker  2. Gross dislodgement of the previously placed RV lead  3. Placement of a new RV lead in the apical septum and removal of the previously positioned lead  4. No early apparent complications.   CT Head Wo Contrast  Result Date: 10/03/2022 CLINICAL DATA:  Neurologic deficit, stroke symptoms EXAM: CT HEAD WITHOUT CONTRAST TECHNIQUE: Contiguous axial images were obtained from the base of the skull through the vertex without intravenous contrast.  RADIATION DOSE REDUCTION: This exam was performed according to the departmental dose-optimization program which includes automated exposure control, adjustment of the mA and/or kV according to patient size and/or use of iterative reconstruction technique. COMPARISON:  None Available. FINDINGS: Brain: Scattered hypodensities within the periventricular white matter are compatible with age-indeterminate small vessel ischemic changes. No other signs of acute infarct or hemorrhage. The lateral ventricles and midline structures are unremarkable. No acute extra-axial fluid collections. No mass effect. Vascular: No hyperdense vessel or unexpected calcification. Skull: Normal. Negative for fracture or focal lesion. Sinuses/Orbits: No acute finding. Other: None. IMPRESSION: 1. Age indeterminate small vessel ischemic changes in the white matter, likely chronic. 2. Otherwise no acute intracranial process. Electronically Signed   By: Randa Ngo M.D.   On: 10/03/2022 13:57   DG Chest Port 1 View  Result Date: 10/03/2022 CLINICAL DATA:  Shortness of breath. EXAM: PORTABLE CHEST 1 VIEW COMPARISON:  August 14, 2022. FINDINGS: The heart size and mediastinal contours are within normal limits. Stable position of left-sided pacemaker. Both lungs are clear. The visualized skeletal structures are unremarkable. IMPRESSION: No active disease. Electronically Signed   By: Marijo Conception M.D.   On: 10/03/2022 13:22     Disposition   Pt is being discharged home today in good condition.  Follow-up Plans & Appointments     Follow-up Information     Vickie Epley, MD Follow up.   Specialties: Cardiology, Radiology Why: The office will call you to arrange a follow-up. If you do not hear from them in 2-3 business days, please call the office. Contact information: [address] [place] [address] [phone]                Discharge Instructions     Diet - low sodium heart healthy   Complete  by: As directed    Increase activity slowly   Complete by: As directed    Please see attached sheet at the end of your After-Visit Summary for instructions on wound care, activity, and bathing.        Discharge Medications   Allergies as of 10/04/2022   No Known Allergies      Medication List     TAKE these medications    acetaminophen 500 MG tablet Commonly known as: TYLENOL Take 500 mg by mouth every 6 (six) hours as needed for mild pain or moderate pain.   Allergy Relief 10 MG tablet Generic drug: loratadine Take 10 mg by mouth daily.   gabapentin 100 MG capsule Commonly known  as: NEURONTIN Take 100 mg by mouth at bedtime.   glipiZIDE 10 MG tablet Commonly known as: GLUCOTROL Take 10 mg by mouth daily before breakfast.   MEGA MULTI MEN PO Take 1 tablet by mouth daily.   metFORMIN 850 MG tablet Commonly known as: GLUCOPHAGE Take 1 tablet (850 mg total) by mouth 2 (two) times daily.   omeprazole 40 MG capsule Commonly known as: PRILOSEC Take 1 capsule (40 mg total) by mouth daily.   simvastatin 20 MG tablet Commonly known as: ZOCOR Take 20 mg by mouth at bedtime.           Outstanding Labs/Studies   None  Duration of Discharge Encounter   Greater than 30 minutes including physician time.  Signed, Erma Heritage, PA-C 10/04/2022, 12:39 PM  Discharge summary was assumed by day team APP, updated as appropriate. Charlie Pitter, PA-C

## 2022-10-04 NOTE — Progress Notes (Addendum)
Rounding Note    Patient Name: Jose Chase Date of Encounter: 10/04/2022  [place] Cardiologist: None   Subjective   NAEO. Feeling well this AM.   Inpatient Medications    Scheduled Meds:  simvastatin  20 mg Oral QHS   sodium chloride flush  3 mL Intravenous Q12H   Continuous Infusions:  sodium chloride     sodium chloride     PRN Meds: acetaminophen, alum & mag hydroxide-simeth, nitroGLYCERIN, ondansetron (ZOFRAN) IV   Vital Signs    Vitals:   10/03/22 1748 10/03/22 2041 10/04/22 0003 10/04/22 0404  BP: 110/77 101/67 93/62 105/68  Pulse: 89 86 84 77  Resp: '19 19 15 18  '$ Temp:  98.4 F (36.9 C) 98.2 F (36.8 C) 97.9 F (36.6 C)  TempSrc:  Oral Oral Oral  SpO2: 100% 100% 99% 98%    Intake/Output Summary (Last 24 hours) at 10/04/2022 0814 Last data filed at 10/03/2022 2359 Gross per 24 hour  Intake 417.38 ml  Output 200 ml  Net 217.38 ml      09/14/2022    7:52 AM 08/28/2022    1:46 PM 08/14/2022    5:17 AM  Last 3 Weights  Weight (lbs) 150 lb 6.4 oz 152 lb 142 lb 4.8 oz  Weight (kg) 68.221 kg 68.947 kg 64.547 kg      Telemetry    Personally Reviewed  ECG    Personally Reviewed  Physical Exam   GEN: No acute distress.   Neck: No JVD Cardiac: RRR, no murmurs, rubs, or gallops. Pocket healing well. Respiratory: Clear to auscultation bilaterally. GI: Soft, nontender, non-distended  MS: No edema; No deformity. Neuro:  Nonfocal  Psych: Normal affect   Labs    High Sensitivity Troponin:   Recent Labs  Lab 10/03/22 1259 10/03/22 1559  TROPONINIHS 9 302*     Chemistry Recent Labs  Lab 10/03/22 1259 10/03/22 1308 10/03/22 2145 10/04/22 0123  NA 141 143 141 138  K 3.0* 3.1* 4.5 4.0  CL 106 107 109 106  CO2 21*  --  21* 23  GLUCOSE 200* 199* 170* 131*  BUN '11 13 12 13  '$ CREATININE 1.15 1.00 0.98 0.98  CALCIUM 9.1  --  8.9 8.8*  PROT 7.6  --   --   --   ALBUMIN 3.6  --   --   --   AST 34  --   --   --   ALT 15   --   --   --   ALKPHOS 77  --   --   --   BILITOT 0.6  --   --   --   GFRNONAA >60  --  >60 >60  ANIONGAP 14  --  11 9    Lipids No results for input(s): "CHOL", "TRIG", "HDL", "LABVLDL", "LDLCALC", "CHOLHDL" in the last 168 hours.  Hematology Recent Labs  Lab 10/03/22 1259 10/03/22 1308  WBC 10.6*  --   RBC 5.75  --   HGB 12.2* 15.0  HCT 42.3 44.0  MCV 73.6*  --   MCH 21.2*  --   MCHC 28.8*  --   RDW 21.1*  --   PLT 354  --    Thyroid No results for input(s): "TSH", "FREET4" in the last 168 hours.  BNPNo results for input(s): "BNP", "PROBNP" in the last 168 hours.  DDimer No results for input(s): "DDIMER" in the last 168 hours.   Radiology    EP PPM/ICD  IMPLANT  Result Date: 10/03/2022  CONCLUSIONS:  1. Complete heart block s/p dual chamber permanent pacemaker  2. Gross dislodgement of the previously placed RV lead  3. Placement of a new RV lead in the apical septum and removal of the previously positioned lead  4. No early apparent complications.   CT Head Wo Contrast  Result Date: 10/03/2022 CLINICAL DATA:  Neurologic deficit, stroke symptoms EXAM: CT HEAD WITHOUT CONTRAST TECHNIQUE: Contiguous axial images were obtained from the base of the skull through the vertex without intravenous contrast. RADIATION DOSE REDUCTION: This exam was performed according to the departmental dose-optimization program which includes automated exposure control, adjustment of the mA and/or kV according to patient size and/or use of iterative reconstruction technique. COMPARISON:  None Available. FINDINGS: Brain: Scattered hypodensities within the periventricular white matter are compatible with age-indeterminate small vessel ischemic changes. No other signs of acute infarct or hemorrhage. The lateral ventricles and midline structures are unremarkable. No acute extra-axial fluid collections. No mass effect. Vascular: No hyperdense vessel or unexpected calcification. Skull: Normal. Negative for  fracture or focal lesion. Sinuses/Orbits: No acute finding. Other: None. IMPRESSION: 1. Age indeterminate small vessel ischemic changes in the white matter, likely chronic. 2. Otherwise no acute intracranial process. Electronically Signed   By: Randa Ngo M.D.   On: 10/03/2022 13:57   DG Chest Port 1 View  Result Date: 10/03/2022 CLINICAL DATA:  Shortness of breath. EXAM: PORTABLE CHEST 1 VIEW COMPARISON:  August 14, 2022. FINDINGS: The heart size and mediastinal contours are within normal limits. Stable position of left-sided pacemaker. Both lungs are clear. The visualized skeletal structures are unremarkable. IMPRESSION: No active disease. Electronically Signed   By: Marijo Conception M.D.   On: 10/03/2022 13:22      Assessment & Plan    #Syncope #RV Lead Dislodgement Doing well after RV lead revision yesterday. Chest x ray shows stable lead position with adequate slack. Device check shows normal device function.  OK to discharge today once CCM clears. I suspect no need for MRI given complete resolution of mental status with improved pacing %.  Arm/shoulder restrictions discussed with the patient in detail this morning.   #Hypotension #Altered mental status Resolved. Suspect this was due to brady and tachyarrhythmias associated with the lead mobility.   I spoke with the wife this morning to update her regarding the plan and restrictions.   For questions or updates, please contact [place] Please consult [email or web address] for contact info under        Signed, Vickie Epley, MD  10/04/2022, 8:14 AM

## 2022-10-05 ENCOUNTER — Encounter (HOSPITAL_COMMUNITY): Payer: Self-pay | Admitting: Cardiology

## 2022-10-08 ENCOUNTER — Other Ambulatory Visit: Payer: Self-pay | Admitting: Student

## 2022-10-08 ENCOUNTER — Telehealth: Payer: Self-pay | Admitting: Internal Medicine

## 2022-10-08 ENCOUNTER — Telehealth: Payer: Self-pay

## 2022-10-08 DIAGNOSIS — R5381 Other malaise: Secondary | ICD-10-CM

## 2022-10-08 LAB — CULTURE, BLOOD (ROUTINE X 2)
Culture: NO GROWTH
Culture: NO GROWTH
Special Requests: ADEQUATE

## 2022-10-08 NOTE — Progress Notes (Signed)
Error

## 2022-10-08 NOTE — Telephone Encounter (Signed)
Per Dr. Wiliam Ke to proceed with outpatient therapy with usual restrictions for new lead placement.  Will route to AT to please orders.

## 2022-10-08 NOTE — Telephone Encounter (Signed)
Received the following request form Melina Copa, PA-C:   Hey device team, I helped with the discharge of this patient for Dr. Quentin Ore who needs a referral for outpatient PT for strengthening. He was admitted with an RV lead dislodgement so I am not sure that he is appropriate yet to proceed. He will have a wound check per usual recommendations - at the time of wound check, can you please review with Dr. Quentin Ore about whether he is OK to proceed with outpatient PT? He would need an outpatient referral at that time. Thank you!  Dayna   Note:  I have added to the appointment notes to review this with Dr. Quentin Ore at patient's scheduled Wound check on 10/17/22.   Copying this to Myrtie Hawk, RN as well as I see she will be covering [facility] that day.

## 2022-10-08 NOTE — Telephone Encounter (Signed)
Wife left a message that he has a procedure scheduled the first week in December and it needed to be cancelled.  He has a pacemaker and went back to his dr.

## 2022-10-08 NOTE — Telephone Encounter (Signed)
Called spouse and advised procedure cancelled. FYI to [employer]

## 2022-10-09 ENCOUNTER — Encounter (HOSPITAL_COMMUNITY): Payer: Self-pay | Admitting: Cardiology

## 2022-10-11 NOTE — Telephone Encounter (Signed)
Noted. Let's have him follow back up with [place] when he is ready to proceed.

## 2022-10-12 ENCOUNTER — Encounter (HOSPITAL_COMMUNITY): Payer: Medicare Other

## 2022-10-12 DIAGNOSIS — I442 Atrioventricular block, complete: Secondary | ICD-10-CM | POA: Diagnosis not present

## 2022-10-12 DIAGNOSIS — Z950 Presence of cardiac pacemaker: Secondary | ICD-10-CM | POA: Diagnosis not present

## 2022-10-12 DIAGNOSIS — R001 Bradycardia, unspecified: Secondary | ICD-10-CM | POA: Diagnosis not present

## 2022-10-12 DIAGNOSIS — I959 Hypotension, unspecified: Secondary | ICD-10-CM | POA: Diagnosis not present

## 2022-10-12 DIAGNOSIS — E1165 Type 2 diabetes mellitus with hyperglycemia: Secondary | ICD-10-CM | POA: Diagnosis not present

## 2022-10-12 NOTE — Telephone Encounter (Signed)
noted 

## 2022-10-15 ENCOUNTER — Ambulatory Visit (HOSPITAL_COMMUNITY): Admission: RE | Admit: 2022-10-15 | Payer: Medicare Other | Source: Home / Self Care

## 2022-10-15 ENCOUNTER — Encounter (HOSPITAL_COMMUNITY): Admission: RE | Payer: Self-pay | Source: Home / Self Care

## 2022-10-15 DIAGNOSIS — K219 Gastro-esophageal reflux disease without esophagitis: Secondary | ICD-10-CM | POA: Diagnosis not present

## 2022-10-15 DIAGNOSIS — E1165 Type 2 diabetes mellitus with hyperglycemia: Secondary | ICD-10-CM | POA: Diagnosis not present

## 2022-10-15 DIAGNOSIS — E785 Hyperlipidemia, unspecified: Secondary | ICD-10-CM | POA: Diagnosis not present

## 2022-10-15 DIAGNOSIS — Z0001 Encounter for general adult medical examination with abnormal findings: Secondary | ICD-10-CM | POA: Diagnosis not present

## 2022-10-15 SURGERY — ESOPHAGOGASTRODUODENOSCOPY (EGD) WITH PROPOFOL
Anesthesia: Monitor Anesthesia Care

## 2022-10-17 ENCOUNTER — Ambulatory Visit: Payer: Medicare Other | Attending: Interventional Cardiology

## 2022-10-17 DIAGNOSIS — I442 Atrioventricular block, complete: Secondary | ICD-10-CM | POA: Diagnosis not present

## 2022-10-17 DIAGNOSIS — Z950 Presence of cardiac pacemaker: Secondary | ICD-10-CM | POA: Diagnosis not present

## 2022-10-17 LAB — CUP PACEART INCLINIC DEVICE CHECK
Battery Remaining Longevity: 124 mo
Battery Voltage: 3.04 V
Brady Statistic RA Percent Paced: 7.1 %
Brady Statistic RV Percent Paced: 99.69 %
Date Time Interrogation Session: 20231206082333
Implantable Lead Connection Status: 753985
Implantable Lead Connection Status: 753985
Implantable Lead Implant Date: 20231002
Implantable Lead Implant Date: 20231002
Implantable Lead Location: 753859
Implantable Lead Location: 753860
Implantable Pulse Generator Implant Date: 20231002
Lead Channel Impedance Value: 400 Ohm
Lead Channel Impedance Value: 487.5 Ohm
Lead Channel Pacing Threshold Amplitude: 0.5 V
Lead Channel Pacing Threshold Amplitude: 0.5 V
Lead Channel Pacing Threshold Amplitude: 0.75 V
Lead Channel Pacing Threshold Amplitude: 0.75 V
Lead Channel Pacing Threshold Pulse Width: 0.5 ms
Lead Channel Pacing Threshold Pulse Width: 0.5 ms
Lead Channel Pacing Threshold Pulse Width: 0.5 ms
Lead Channel Pacing Threshold Pulse Width: 0.5 ms
Lead Channel Sensing Intrinsic Amplitude: 10 mV
Lead Channel Sensing Intrinsic Amplitude: 2.2 mV
Lead Channel Setting Pacing Amplitude: 1 V
Lead Channel Setting Pacing Amplitude: 1.5 V
Lead Channel Setting Pacing Pulse Width: 0.5 ms
Lead Channel Setting Sensing Sensitivity: 2 mV
Pulse Gen Model: 2272
Pulse Gen Serial Number: 8115141

## 2022-10-17 NOTE — Progress Notes (Signed)

## 2022-10-17 NOTE — Patient Instructions (Signed)

## 2022-10-19 DIAGNOSIS — Z0001 Encounter for general adult medical examination with abnormal findings: Secondary | ICD-10-CM | POA: Diagnosis not present

## 2022-10-19 DIAGNOSIS — E785 Hyperlipidemia, unspecified: Secondary | ICD-10-CM | POA: Diagnosis not present

## 2022-10-19 DIAGNOSIS — K219 Gastro-esophageal reflux disease without esophagitis: Secondary | ICD-10-CM | POA: Diagnosis not present

## 2022-10-19 DIAGNOSIS — E1165 Type 2 diabetes mellitus with hyperglycemia: Secondary | ICD-10-CM | POA: Diagnosis not present

## 2022-10-30 DIAGNOSIS — N400 Enlarged prostate without lower urinary tract symptoms: Secondary | ICD-10-CM | POA: Diagnosis not present

## 2022-10-30 DIAGNOSIS — I442 Atrioventricular block, complete: Secondary | ICD-10-CM | POA: Diagnosis not present

## 2022-10-30 DIAGNOSIS — Z1331 Encounter for screening for depression: Secondary | ICD-10-CM | POA: Diagnosis not present

## 2022-10-30 DIAGNOSIS — Z950 Presence of cardiac pacemaker: Secondary | ICD-10-CM | POA: Diagnosis not present

## 2022-10-30 DIAGNOSIS — Z1389 Encounter for screening for other disorder: Secondary | ICD-10-CM | POA: Diagnosis not present

## 2022-10-30 DIAGNOSIS — E785 Hyperlipidemia, unspecified: Secondary | ICD-10-CM | POA: Diagnosis not present

## 2022-10-30 DIAGNOSIS — E1165 Type 2 diabetes mellitus with hyperglycemia: Secondary | ICD-10-CM | POA: Diagnosis not present

## 2022-10-30 DIAGNOSIS — K219 Gastro-esophageal reflux disease without esophagitis: Secondary | ICD-10-CM | POA: Diagnosis not present

## 2022-11-13 ENCOUNTER — Ambulatory Visit (INDEPENDENT_AMBULATORY_CARE_PROVIDER_SITE_OTHER): Payer: Medicare Other

## 2022-11-13 DIAGNOSIS — I442 Atrioventricular block, complete: Secondary | ICD-10-CM

## 2022-11-13 LAB — CUP PACEART REMOTE DEVICE CHECK
Battery Remaining Longevity: 123 mo
Battery Remaining Percentage: 95.5 %
Battery Voltage: 3.04 V
Brady Statistic AP VP Percent: 11 %
Brady Statistic AP VS Percent: 1 %
Brady Statistic AS VP Percent: 89 %
Brady Statistic AS VS Percent: 1 %
Brady Statistic RA Percent Paced: 10 %
Brady Statistic RV Percent Paced: 99 %
Date Time Interrogation Session: 20240102040014
Implantable Lead Connection Status: 753985
Implantable Lead Connection Status: 753985
Implantable Lead Implant Date: 20231002
Implantable Lead Implant Date: 20231002
Implantable Lead Location: 753859
Implantable Lead Location: 753860
Implantable Pulse Generator Implant Date: 20231002
Lead Channel Impedance Value: 400 Ohm
Lead Channel Impedance Value: 480 Ohm
Lead Channel Pacing Threshold Amplitude: 0.625 V
Lead Channel Pacing Threshold Amplitude: 0.625 V
Lead Channel Pacing Threshold Pulse Width: 0.5 ms
Lead Channel Pacing Threshold Pulse Width: 0.5 ms
Lead Channel Sensing Intrinsic Amplitude: 1.9 mV
Lead Channel Sensing Intrinsic Amplitude: 11.1 mV
Lead Channel Setting Pacing Amplitude: 0.875
Lead Channel Setting Pacing Amplitude: 1.625
Lead Channel Setting Pacing Pulse Width: 0.5 ms
Lead Channel Setting Sensing Sensitivity: 2 mV
Pulse Gen Model: 2272
Pulse Gen Serial Number: 8115141

## 2022-11-26 ENCOUNTER — Encounter: Payer: Medicare Other | Admitting: Cardiology

## 2022-11-27 DIAGNOSIS — E119 Type 2 diabetes mellitus without complications: Secondary | ICD-10-CM | POA: Diagnosis not present

## 2022-11-30 DIAGNOSIS — E1165 Type 2 diabetes mellitus with hyperglycemia: Secondary | ICD-10-CM | POA: Diagnosis not present

## 2022-11-30 DIAGNOSIS — D508 Other iron deficiency anemias: Secondary | ICD-10-CM | POA: Diagnosis not present

## 2022-12-18 NOTE — Progress Notes (Signed)
Remote pacemaker transmission.   

## 2022-12-31 DIAGNOSIS — E1165 Type 2 diabetes mellitus with hyperglycemia: Secondary | ICD-10-CM | POA: Diagnosis not present

## 2022-12-31 DIAGNOSIS — D508 Other iron deficiency anemias: Secondary | ICD-10-CM | POA: Diagnosis not present

## 2023-01-07 NOTE — Progress Notes (Deleted)
  Electrophysiology Office Follow up Visit Note:    Date:  01/07/2023   ID:  Jose, Chase 06-17-1932, MRN [id number]  PCP:  Jose Meiers, MD  [facility] Cardiologist:  None  [facility] Electrophysiologist:  Jose Epley, MD    Interval History:    Jose Chase is a 89 y.o. male who presents for a follow up visit.   He had a lead revision for dislodged RV lead on October 03, 2022.  Remote interrogations after revision has shown stable device function.       Past medical, surgical, social and family history were reviewed.  ROS:   Please see the history of present illness.    All other systems reviewed and are negative.  EKGs/Labs/Other Studies Reviewed:    The following studies were reviewed today:  January 08, 2023 in clinic device interrogation personally reviewed ***    Physical Exam:    VS:  There were no vitals taken for this visit.    Wt Readings from Last 3 Encounters:  09/14/22 150 lb 6.4 oz (68.2 kg)  08/28/22 152 lb (68.9 kg)  08/14/22 142 lb 4.8 oz (64.5 kg)     GEN: *** Well nourished, well developed in no acute distress CARDIAC: ***RRR, no murmurs, rubs, gallops RESPIRATORY:  Clear to auscultation without rales, wheezing or rhonchi       ASSESSMENT:    1. Complete heart block (HCC)   2. Cardiac pacemaker in situ    PLAN:    In order of problems listed above:   #Complete heart block #Permanent pacemaker in situ Device functioning appropriately.  Continue remote monitoring.   Follow-up 1 year with APP.        Signed, Jose Mage, MD, [facility], [facility] 01/07/2023 10:14 PM    Electrophysiology [facility]

## 2023-01-08 ENCOUNTER — Encounter: Payer: Self-pay | Admitting: Cardiology

## 2023-01-08 ENCOUNTER — Ambulatory Visit: Payer: Medicare Other | Attending: Cardiology | Admitting: Cardiology

## 2023-01-08 VITALS — BP 122/64 | HR 75 | Ht 72.0 in | Wt 156.0 lb

## 2023-01-08 DIAGNOSIS — I442 Atrioventricular block, complete: Secondary | ICD-10-CM | POA: Insufficient documentation

## 2023-01-08 DIAGNOSIS — Z950 Presence of cardiac pacemaker: Secondary | ICD-10-CM | POA: Insufficient documentation

## 2023-01-08 NOTE — Patient Instructions (Signed)
Medication Instructions:  Your physician recommends that you continue on your current medications as directed. Please refer to the Current Medication list given to you today.  *If you need a refill on your cardiac medications before your next appointment, please call your pharmacy*  Follow-Up: At [facility], you and your health needs are our priority.  As part of our continuing mission to provide you with exceptional heart care, we have created designated Provider Care Teams.  These Care Teams include your primary Cardiologist (physician) and Advanced Practice Providers (APPs -  Physician Assistants and Nurse Practitioners) who all work together to provide you with the care you need, when you need it.  Your next appointment:   1 year(s)  Provider:   You may see Vickie Epley, MD or one of the following Advanced Practice Providers on your designated Care Team:   Tommye Standard, [place] Beryle Beams" Navarro, [place], NP

## 2023-01-08 NOTE — Progress Notes (Signed)
  Electrophysiology Office Follow up Visit Note:    Date:  01/08/2023   ID:  Jose Chase, Jose Chase May 27, 1932, MRN [id number]  PCP:  Carrolyn Meiers, MD  [facility] Cardiologist:  None  [facility] Electrophysiologist:  Vickie Epley, MD    Interval History:    Jose Chase is a 89 y.o. male who presents for a follow up visit.   He had a lead revision for dislodged RV lead on October 03, 2022.  Remote interrogations after revision has shown stable device function.  Today, he is accompanied by two family members. He denies any chest or shoulder discomfort relating to his device.  He will usually complete his own yard work with a Engineer, building services.  In clinic today his blood pressure was initially 148/70, and on recheck improved to 122/64.  He denies any palpitations, chest pain, shortness of breath, or peripheral edema. No lightheadedness, headaches, syncope, orthopnea, or PND.      Past medical, surgical, social and family history were reviewed.  ROS:   Please see the history of present illness.    All other systems reviewed and are negative.  EKGs/Labs/Other Studies Reviewed:    The following studies were reviewed today:  January 08, 2023 in clinic device interrogation personally reviewed Battery longevity 10.2 years Lead parameter stable Ventricular pacing greater than 99% Atrial pacing 15%    Physical Exam:    VS:  There were no vitals taken for this visit.    Wt Readings from Last 3 Encounters:  09/14/22 150 lb 6.4 oz (68.2 kg)  08/28/22 152 lb (68.9 kg)  08/14/22 142 lb 4.8 oz (64.5 kg)     GEN:  Well nourished, well developed in no acute distress CARDIAC: RRR, no murmurs, rubs, gallops. Pectoral pocket well healed. RESPIRATORY:  Clear to auscultation without rales, wheezing or rhonchi       ASSESSMENT:    1. Complete heart block (HCC)   2. Cardiac pacemaker in situ    PLAN:    In order of problems listed above:   #Complete  heart block #Permanent pacemaker in situ Device functioning appropriately.  Continue remote monitoring.   Follow-up 1 year with APP.   I,Mathew Stumpf,acting as a Education administrator for Vickie Epley, MD.,have documented all relevant documentation on the behalf of Vickie Epley, MD,as directed by  Vickie Epley, MD while in the presence of Vickie Epley, MD.  I, Vickie Epley, MD, have reviewed all documentation for this visit. The documentation on 01/08/23 for the exam, diagnosis, procedures, and orders are all accurate and complete.   Signed, Lars Mage, MD, [facility], [facility] 01/08/2023 8:05 AM    Electrophysiology [facility]

## 2023-01-29 DIAGNOSIS — D508 Other iron deficiency anemias: Secondary | ICD-10-CM | POA: Diagnosis not present

## 2023-01-29 DIAGNOSIS — E1165 Type 2 diabetes mellitus with hyperglycemia: Secondary | ICD-10-CM | POA: Diagnosis not present

## 2023-01-31 DIAGNOSIS — Z23 Encounter for immunization: Secondary | ICD-10-CM | POA: Diagnosis not present

## 2023-02-12 ENCOUNTER — Ambulatory Visit (INDEPENDENT_AMBULATORY_CARE_PROVIDER_SITE_OTHER): Payer: Medicare Other

## 2023-02-12 DIAGNOSIS — I442 Atrioventricular block, complete: Secondary | ICD-10-CM

## 2023-02-13 LAB — CUP PACEART REMOTE DEVICE CHECK
Battery Remaining Longevity: 116 mo
Battery Remaining Percentage: 95.5 %
Battery Voltage: 3.02 V
Brady Statistic AP VP Percent: 26 %
Brady Statistic AP VS Percent: 1 %
Brady Statistic AS VP Percent: 73 %
Brady Statistic AS VS Percent: 1 %
Brady Statistic RA Percent Paced: 25 %
Brady Statistic RV Percent Paced: 99 %
Date Time Interrogation Session: 20240402040013
Implantable Lead Connection Status: 753985
Implantable Lead Connection Status: 753985
Implantable Lead Implant Date: 20231002
Implantable Lead Implant Date: 20231002
Implantable Lead Location: 753859
Implantable Lead Location: 753860
Implantable Pulse Generator Implant Date: 20231002
Lead Channel Impedance Value: 390 Ohm
Lead Channel Impedance Value: 450 Ohm
Lead Channel Pacing Threshold Amplitude: 0.625 V
Lead Channel Pacing Threshold Amplitude: 0.75 V
Lead Channel Pacing Threshold Pulse Width: 0.5 ms
Lead Channel Pacing Threshold Pulse Width: 0.5 ms
Lead Channel Sensing Intrinsic Amplitude: 11.3 mV
Lead Channel Sensing Intrinsic Amplitude: 2.6 mV
Lead Channel Setting Pacing Amplitude: 1 V
Lead Channel Setting Pacing Amplitude: 1.625
Lead Channel Setting Pacing Pulse Width: 0.5 ms
Lead Channel Setting Sensing Sensitivity: 2 mV
Pulse Gen Model: 2272
Pulse Gen Serial Number: 8115141

## 2023-02-14 DIAGNOSIS — E785 Hyperlipidemia, unspecified: Secondary | ICD-10-CM | POA: Diagnosis not present

## 2023-02-14 DIAGNOSIS — I959 Hypotension, unspecified: Secondary | ICD-10-CM | POA: Diagnosis not present

## 2023-02-14 DIAGNOSIS — E1165 Type 2 diabetes mellitus with hyperglycemia: Secondary | ICD-10-CM | POA: Diagnosis not present

## 2023-02-14 DIAGNOSIS — K4091 Unilateral inguinal hernia, without obstruction or gangrene, recurrent: Secondary | ICD-10-CM | POA: Diagnosis not present

## 2023-02-25 ENCOUNTER — Other Ambulatory Visit: Payer: Self-pay | Admitting: *Deleted

## 2023-02-25 DIAGNOSIS — K4090 Unilateral inguinal hernia, without obstruction or gangrene, not specified as recurrent: Secondary | ICD-10-CM

## 2023-02-28 ENCOUNTER — Ambulatory Visit (INDEPENDENT_AMBULATORY_CARE_PROVIDER_SITE_OTHER): Payer: Medicare Other | Admitting: Surgery

## 2023-02-28 ENCOUNTER — Encounter: Payer: Self-pay | Admitting: Surgery

## 2023-02-28 ENCOUNTER — Encounter: Payer: Self-pay | Admitting: *Deleted

## 2023-02-28 VITALS — BP 108/64 | HR 60 | Temp 97.5°F | Resp 12 | Ht 72.0 in | Wt 149.0 lb

## 2023-02-28 DIAGNOSIS — K4090 Unilateral inguinal hernia, without obstruction or gangrene, not specified as recurrent: Secondary | ICD-10-CM | POA: Diagnosis not present

## 2023-03-01 NOTE — Progress Notes (Signed)
[facility] History and Physical  Reason for Referral: Inguinal hernia Referring Physician: Dr. Felecia Shelling  Chief Complaint   New Patient (Initial Visit)     Jose Chase is a 89 y.o. male.  HPI: Patient presents for evaluation of a right inguinal hernia.  He believes it has been present for at least 2 years, but recently it has been increasing in size.  He does have occasional stinging pains associated with this bulge.  He has never tried to reduce it, but when he lays down, the hernia self reduces.  He is tolerating a diet without nausea and vomiting, moving his bowels without issue.  He is concerned about undergoing anesthesia, as he was scheduled for an esophageal dilation back in November, but was told that he had a complete heart block and required pacemaker placement.  His past medical history is significant for heart block, diabetes, GERD, and hyperlipidemia.  He denies use of blood thinning medications.  His surgical history is significant for prostate surgery for cancer in 1996, back surgeries, and a left inguinal hernia repair very long ago.  He denies use of tobacco products, alcohol, and illicit drugs.  Past Medical History:  Diagnosis Date   Aspirin long-term use    DM (diabetes mellitus)    Dyspnea    GERD (gastroesophageal reflux disease)    Hemorrhoids, internal 10/13/2007   Hiatal hernia DEC 2008 LARGE   Prostate cancer     Past Surgical History:  Procedure Laterality Date   BACK SURGERY     COLONOSCOPY  DEC 2008   COMPLICATED BY CONTAINED RECTAL PERFORATION   ESOPHAGOGASTRODUODENOSCOPY  05/29/2011   Procedure: ESOPHAGOGASTRODUODENOSCOPY (EGD);  Surgeon: Arlyce Harman, MD;  [facility];  Service: Endoscopy;  Laterality: N/A;  Possible ED   ESOPHAGOGASTRODUODENOSCOPY N/A 02/07/2016   Procedure: ESOPHAGOGASTRODUODENOSCOPY (EGD);  Surgeon: West Bali, MD;  Location: [facility];  Service: Endoscopy;  Laterality: N/A;  200 - moved to 3/28  @ 10:30 - office notified pt   ESOPHAGOGASTRODUODENOSCOPY (EGD) WITH ESOPHAGEAL DILATION N/A 10/20/2013   JYN:WGNFAOZHY at the gastroesophageal junction/ Medium sized hiatal hernia/MILD Erosive gastritis. Biopsy showed chronic inflammation, no H. pylori, fragment of fundic gland polyp   LEAD REVISION/REPAIR N/A 10/03/2022   Procedure: LEAD REVISION/REPAIR;  Surgeon: Lanier Prude, MD;  Location: [facility] INVASIVE CV LAB;  Service: Cardiovascular;  Laterality: N/A;   MALONEY DILATION  05/29/2011   Procedure: Elease Hashimoto DILATION;  Surgeon: Arlyce Harman, MD;  Location: [facility];  Service: Endoscopy;  Laterality: N/A;   PACEMAKER IMPLANT N/A 08/13/2022   Procedure: PACEMAKER IMPLANT;  Surgeon: Lanier Prude, MD;  Location: [facility] INVASIVE CV LAB;  Service: Cardiovascular;  Laterality: N/A;   PROSTATE SURGERY     prostate cancer   SAVORY DILATION  05/29/2011   Procedure: SAVORY DILATION;  Surgeon: Arlyce Harman, MD;  [facility];  Service: Endoscopy;  Laterality: N/A;   SAVORY DILATION N/A 02/07/2016   Procedure: SAVORY DILATION;  Surgeon: West Bali, MD;  Location: [facility];  Service: Endoscopy;  Laterality: N/A;   UPPER GASTROINTESTINAL ENDOSCOPY  DEC 2008   QM:VHQION GASTRIC MUCOSA    Family History  Problem Relation Age of Onset   Heart disease Father    Heart disease Brother    Cancer Brother    Cancer Brother    Cancer Other    Cancer Sister    Colon cancer Neg Hx    Colon polyps Neg  Hx     Social History   Tobacco Use   Smoking status: Former    Types: Cigarettes    Quit date: 01/11/1973    Years since quitting: 50.1   Smokeless tobacco: Never   Tobacco comments:    Never smoked very much. Occasional smoker-once or twice a week. Quit in 1974.  Vaping Use   Vaping Use: Never used  Substance Use Topics   Alcohol use: No    Alcohol/week: 0.0 standard drinks of alcohol   Drug use: No    Medications: I have reviewed the patient's current  medications. Allergies as of 02/28/2023   No Known Allergies      Medication List        Accurate as of February 28, 2023 11:59 PM. If you have any questions, ask your nurse or doctor.          acetaminophen 500 MG tablet Commonly known as: TYLENOL Take 500 mg by mouth every 6 (six) hours as needed for mild pain or moderate pain.   Allergy Relief 10 MG tablet Generic drug: loratadine Take 10 mg by mouth daily.   gabapentin 100 MG capsule Commonly known as: NEURONTIN Take 100 mg by mouth at bedtime.   glipiZIDE 10 MG tablet Commonly known as: GLUCOTROL Take 10 mg by mouth daily before breakfast.   MEGA MULTI MEN PO Take 1 tablet by mouth daily.   metFORMIN 850 MG tablet Commonly known as: GLUCOPHAGE Take 1 tablet (850 mg total) by mouth 2 (two) times daily.   omeprazole 40 MG capsule Commonly known as: PRILOSEC Take 1 capsule (40 mg total) by mouth daily.   simvastatin 20 MG tablet Commonly known as: ZOCOR Take 20 mg by mouth at bedtime.         ROS:  Constitutional: negative for chills, fatigue, and fevers Eyes: negative for visual disturbance and pain Ears, nose, mouth, throat, and face: positive for sinus problems, negative for ear drainage and sore throat Respiratory: positive for shortness of breath, negative for cough and wheezing Cardiovascular: negative for chest pain and palpitations Gastrointestinal: positive for abdominal pain and nausea, negative for reflux symptoms and vomiting Genitourinary:negative for dysuria and frequency Integument/breast: negative for dryness and rash Hematologic/lymphatic: negative for bleeding and lymphadenopathy Musculoskeletal:negative for back pain and neck pain Neurological: positive for dizziness Endocrine: negative for temperature intolerance  Blood pressure 108/64, pulse 60, temperature (!) 97.5 F (36.4 C), temperature source Oral, resp. rate 12, height 6' (1.829 m), weight 149 lb (67.6 kg), SpO2 98  %. Physical Exam Vitals reviewed.  Constitutional:      Appearance: Normal appearance.  HENT:     Head: Normocephalic and atraumatic.  Eyes:     Extraocular Movements: Extraocular movements intact.     Pupils: Pupils are equal, round, and reactive to light.  Cardiovascular:     Rate and Rhythm: Normal rate and regular rhythm.  Pulmonary:     Effort: Pulmonary effort is normal.     Breath sounds: Normal breath sounds.  Abdominal:     Comments: Abdomen soft, nondistended, no percussion tenderness, nontender to palpation; no rigidity, guarding, rebound tenderness; soft and reducible right inguinal hernia, midline cicatrix from prostate surgery, scar from open left inguinal hernia repair, no evidence of left inguinal hernia  Musculoskeletal:        General: Normal range of motion.     Cervical back: Normal range of motion.  Skin:    General: Skin is warm and dry.  Neurological:  General: No focal deficit present.     Mental Status: He is alert and oriented to person, place, and time.  Psychiatric:        Mood and Affect: Mood normal.        Behavior: Behavior normal.     Results: No results found for this or any previous visit (from the past 48 hour(s)).  No results found.   Assessment & Plan:  VANDERBILT RANIERI is a 89 y.o. male who presents for evaluation of a right inguinal hernia  -I explained the pathophysiology of hernias, and why we recommend surgical repair. -The risk and benefits of open right inguinal hernia were discussed including but not limited to bleeding, infection, injury to surrounding structures, need for additional procedures, hernia recurrence, and lung or heart complication from anesthesia.   -Patient expressed that he is concerned that he is to undergo anesthesia.  Advised him that we would reach out to his cardiologist for clearance for surgery, to verify that he is not of significantly increased risk for this procedure -We also discussed the option of  nonoperatively monitoring the hernia and operating if necessary in the future -Advised the patient that he needs to present to the emergency department if his hernia becomes hard, painful, nonreducible, and he develops nausea, vomiting, and obstipation. -Information provided to the patient regarding inguinal hernias -I will call the patient once cardiology has made their recommendations to discuss proceeding with surgery versus nonoperative monitoring of the hernia  All questions were answered to the satisfaction of the patient and family.  Theophilus Kinds, DO [facility] [address] [place], [place] [address] [phone] (office)

## 2023-03-05 ENCOUNTER — Telehealth: Payer: Self-pay | Admitting: *Deleted

## 2023-03-05 NOTE — Telephone Encounter (Signed)
Pt has been scheduled for a tele visit 03/12/23, consent on file / medications reconciled.

## 2023-03-05 NOTE — Telephone Encounter (Signed)
   Pre-operative Risk Assessment    Patient Name: Jose Chase  DOB: 1932/03/16 MRN: [id number]      Request for Surgical Clearance    Procedure:   OPEN INGUINAL HERNIA REPAIR WITH MESH  Date of Surgery:  Clearance TBD                                Surgeon:  DR. Theophilus Kinds Surgeon's Group or Practice Name:  [facility] Phone number:  [phone] Fax number:  [phone]   Type of Clearance Requested:   - Medical ; NO MEDICATIONS LISTED AS NEEDING TO BE HELD   Type of Anesthesia:  General    Additional requests/questions:    Jose Chase   03/05/2023, 12:53 PM

## 2023-03-05 NOTE — Telephone Encounter (Signed)
Primary Cardiologist:None   Preoperative team, please contact this patient and set up a phone call appointment for further preoperative risk assessment. Please obtain consent and complete medication review. Thank you for your help.   I confirm that guidance regarding antiplatelet and oral anticoagulation therapy has been completed and, if necessary, noted below (none requested).  Levi Aland, NP-C  03/05/2023, 1:27 PM [address], [address] Office [phone] Fax [phone]

## 2023-03-05 NOTE — Telephone Encounter (Signed)
Pt has been scheduled for a tele visit 03/12/23, consent on file / medications reconciled.     Patient Consent for Virtual Visit        Jose Chase has provided verbal consent on 03/05/2023 for a virtual visit (video or telephone).   CONSENT FOR VIRTUAL VISIT FOR:  Jose Chase  By participating in this virtual visit I agree to the following:  I hereby voluntarily request, consent and authorize [facility] and its employed or contracted physicians, physician assistants, nurse practitioners or other licensed health care professionals (the Practitioner), to provide me with telemedicine health care services (the "Services") as deemed necessary by the treating Practitioner. I acknowledge and consent to receive the Services by the Practitioner via telemedicine. I understand that the telemedicine visit will involve communicating with the Practitioner through live audiovisual communication technology and the disclosure of certain medical information by electronic transmission. I acknowledge that I have been given the opportunity to request an in-person assessment or other available alternative prior to the telemedicine visit and am voluntarily participating in the telemedicine visit.  I understand that I have the right to withhold or withdraw my consent to the use of telemedicine in the course of my care at any time, without affecting my right to future care or treatment, and that the Practitioner or I may terminate the telemedicine visit at any time. I understand that I have the right to inspect all information obtained and/or recorded in the course of the telemedicine visit and may receive copies of available information for a reasonable fee.  I understand that some of the potential risks of receiving the Services via telemedicine include:  Delay or interruption in medical evaluation due to technological equipment failure or disruption; Information transmitted may not be sufficient (e.g.  poor resolution of images) to allow for appropriate medical decision making by the Practitioner; and/or  In rare instances, security protocols could fail, causing a breach of personal health information.  Furthermore, I acknowledge that it is my responsibility to provide information about my medical history, conditions and care that is complete and accurate to the best of my ability. I acknowledge that Practitioner's advice, recommendations, and/or decision may be based on factors not within their control, such as incomplete or inaccurate data provided by me or distortions of diagnostic images or specimens that may result from electronic transmissions. I understand that the practice of medicine is not an exact science and that Practitioner makes no warranties or guarantees regarding treatment outcomes. I acknowledge that a copy of this consent can be made available to me via my patient portal [facility] MyChart), or I can request a printed copy by calling the office of [facility].    I understand that my insurance will be billed for this visit.   I have read or had this consent read to me. I understand the contents of this consent, which adequately explains the benefits and risks of the Services being provided via telemedicine.  I have been provided ample opportunity to ask questions regarding this consent and the Services and have had my questions answered to my satisfaction. I give my informed consent for the services to be provided through the use of telemedicine in my medical care

## 2023-03-10 NOTE — Progress Notes (Unsigned)
Virtual Visit via Telephone Note   Because of Jose Chase's co-morbid illnesses, he is at least at moderate risk for complications without adequate follow up.  This format is felt to be most appropriate for this patient at this time.  The patient did not have access to video technology/had technical difficulties with video requiring transitioning to audio format only (telephone).  All issues noted in this document were discussed and addressed.  No physical exam could be performed with this format.  Please refer to the patient's chart for his consent to telehealth for [facility].  Evaluation Performed:  Preoperative cardiovascular risk assessment _____________   Date:  03/10/2023   Patient ID:  Jose Chase [birth year], MRN [id number] Patient Location:  Home Provider location:   Office  Primary Care Provider:  Benetta Spar, MD Primary Cardiologist:  None  Chief Complaint / Patient Profile   89 y.o. y/o male with a h/o complete heart block s/p PPM, esophageal stricture, prostate cancer, DM type II, hiatal hernia who is pending open inguinal hernia repair with mesh and presents today for telephonic preoperative cardiovascular risk assessment.  History of Present Illness    Jose Chase is a 89 y.o. male who presents via audio/video conferencing for a telehealth visit today.  Pt was last seen in cardiology clinic on Dr. Lalla Brothers by 12/14/2022.  At that time Jose Chase was doing well with. The patient is now pending procedure as outlined above. Since his last visit, he has been doing well with no new cardiac complaints.  He is able to complete greater than 4 METS of activity without any difficulty.   He denies chest pain, shortness of breath, lower extremity edema, fatigue, palpitations, melena, hematuria, hemoptysis, diaphoresis, weakness, presyncope, syncope, orthopnea, and PND.    Past Medical History    Past Medical History:  Diagnosis Date   Aspirin  long-term use    DM (diabetes mellitus) (HCC)    Dyspnea    GERD (gastroesophageal reflux disease)    Hemorrhoids, internal 10/13/2007   Hiatal hernia DEC 2008 LARGE   Prostate cancer [facility])    Past Surgical History:  Procedure Laterality Date   BACK SURGERY     COLONOSCOPY  DEC 2008   COMPLICATED BY CONTAINED RECTAL PERFORATION   ESOPHAGOGASTRODUODENOSCOPY  05/29/2011   Procedure: ESOPHAGOGASTRODUODENOSCOPY (EGD);  Surgeon: Arlyce Harman, MD;  Location: [facility]O [facility];  Service: Endoscopy;  Laterality: N/A;  Possible ED   ESOPHAGOGASTRODUODENOSCOPY N/A 02/07/2016   Procedure: ESOPHAGOGASTRODUODENOSCOPY (EGD);  Surgeon: West Bali, MD;  Location: [facility];  Service: Endoscopy;  Laterality: N/A;  200 - moved to 3/28 @ 10:30 - office notified pt   ESOPHAGOGASTRODUODENOSCOPY (EGD) WITH ESOPHAGEAL DILATION N/A 10/20/2013   NWG:[id number] at the gastroesophageal junction/ Medium sized hiatal hernia/MILD Erosive gastritis. Biopsy showed chronic inflammation, no H. pylori, fragment of fundic gland polyp   LEAD REVISION/REPAIR N/A 10/03/2022   Procedure: LEAD REVISION/REPAIR;  Surgeon: Lanier Prude, MD;  Location: [facility] INVASIVE CV LAB;  Service: Cardiovascular;  Laterality: N/A;   MALONEY DILATION  05/29/2011   Procedure: Elease Hashimoto DILATION;  Surgeon: Arlyce Harman, MD;  Location: [facility];  Service: Endoscopy;  Laterality: N/A;   PACEMAKER IMPLANT N/A 08/13/2022   Procedure: PACEMAKER IMPLANT;  Surgeon: Lanier Prude, MD;  Location: [facility] INVASIVE CV LAB;  Service: Cardiovascular;  Laterality: N/A;   PROSTATE SURGERY     prostate cancer   SAVORY DILATION  05/29/2011  Procedure: SAVORY DILATION;  Surgeon: Arlyce Harman, MD;  Location: [facility];  Service: Endoscopy;  Laterality: N/A;   SAVORY DILATION N/A 02/07/2016   Procedure: SAVORY DILATION;  Surgeon: West Bali, MD;  Location: [facility];  Service: Endoscopy;  Laterality: N/A;   UPPER GASTROINTESTINAL ENDOSCOPY  DEC  2008   [id number] GASTRIC MUCOSA    Allergies  No Known Allergies  Home Medications    Prior to Admission medications   Medication Sig Start Date End Date Taking? Authorizing Provider  acetaminophen (TYLENOL) 500 MG tablet Take 500 mg by mouth every 6 (six) hours as needed for mild pain or moderate pain.    [provider]  ALLERGY RELIEF 10 MG tablet Take 10 mg by mouth daily. 02/20/22   [provider]  gabapentin (NEURONTIN) 100 MG capsule Take 100 mg by mouth at bedtime. 04/25/22   [provider]  glipiZIDE (GLUCOTROL) 10 MG tablet Take 10 mg by mouth daily before breakfast.    [provider]  metFORMIN (GLUCOPHAGE) 850 MG tablet Take 1 tablet (850 mg total) by mouth 2 (two) times daily. 08/15/22   Sherie Don, NP  Multiple Vitamins-Minerals (MEGA MULTI MEN PO) Take 1 tablet by mouth daily.    [provider]  omeprazole (PRILOSEC) 40 MG capsule Take 1 capsule (40 mg total) by mouth daily. 07/13/22   Lanelle Bal, DO  simvastatin (ZOCOR) 20 MG tablet Take 20 mg by mouth at bedtime.      [provider]    Physical Exam    Vital Signs:  CLINTEN HOWK does not have vital signs available for review today.  Given telephonic nature of communication, physical exam is limited. AAOx3. NAD. Normal affect.  Speech and respirations are unlabored.  Accessory Clinical Findings    None  Assessment & Plan    1.  Preoperative Cardiovascular Risk Assessment:  RCRI score of 0.9%  The patient affirms he has been doing well without any new cardiac symptoms. They are able to achieve 4 METS without cardiac limitations. Therefore, based on ACC/AHA guidelines, the patient would be at acceptable risk for the planned procedure without further cardiovascular testing. The patient was advised that if he develops new symptoms prior to surgery to contact our office to arrange for a follow-up visit, and he verbalized understanding.   The patient  was advised that if he develops new symptoms prior to surgery to contact our office to arrange for a follow-up visit, and he verbalized understanding.  Patient currently not on any blood thinning medications to hold  A copy of this note will be routed to requesting surgeon.  Time:   Today, I have spent 6 minutes with the patient with telehealth technology discussing medical history, symptoms, and management plan.     Napoleon Form, Leodis Rains, NP  03/10/2023, 7:02 PM

## 2023-03-12 ENCOUNTER — Ambulatory Visit: Payer: Medicare Other | Attending: Cardiovascular Disease

## 2023-03-12 DIAGNOSIS — Z01810 Encounter for preprocedural cardiovascular examination: Secondary | ICD-10-CM | POA: Diagnosis not present

## 2023-03-13 ENCOUNTER — Telehealth (INDEPENDENT_AMBULATORY_CARE_PROVIDER_SITE_OTHER): Payer: Medicare Other | Admitting: Surgery

## 2023-03-13 DIAGNOSIS — K4090 Unilateral inguinal hernia, without obstruction or gangrene, not specified as recurrent: Secondary | ICD-10-CM

## 2023-03-13 NOTE — Telephone Encounter (Signed)
[facility]  Called to speak with the patient regarding if he would want to proceed with surgery for his right inguinal hernia.  I called and spoke with his wife, Mariano Doshi.  She expressed that he does want to proceed with surgery, as he spoke with the cardiologist who said he was low risk for a cardiac complication from the surgery.  I advised that we will tentatively schedule him for surgery on 5/9 for an open right inguinal hernia repair with mesh.  We have previously discussed all of the risks and benefits associated with the surgery, and they had no questions at this time.  I advised that my office will call to give them more details regarding his preoperative appointment.  I did advise that this telephone encounter could incur a charge to their insurance.  All questions were answered to their expressed satisfaction.   Theophilus Kinds, DO [facility] [address] [place] [place], [place] [address] [phone] (office)

## 2023-03-19 DIAGNOSIS — D508 Other iron deficiency anemias: Secondary | ICD-10-CM | POA: Diagnosis not present

## 2023-03-19 DIAGNOSIS — E1165 Type 2 diabetes mellitus with hyperglycemia: Secondary | ICD-10-CM | POA: Diagnosis not present

## 2023-03-19 NOTE — Patient Instructions (Addendum)
Your procedure is scheduled on: 03/21/2023  Report to [facility] Main Entrance at  6:00   AM.  Call this number if you have problems the morning of surgery: [phone]   Remember:   Do not Eat or Drink after midnight         No Smoking the morning of surgery  :  Take these medicines the morning of surgery with A SIP OF WATER: Gabapentin and omeprazole   Do not wear jewelry, make-up or nail polish.  Do not wear lotions, powders, or perfumes. You may wear deodorant.  Do not shave 48 hours prior to surgery. Men may shave face and neck.  Do not bring valuables to the hospital.  Contacts, dentures or bridgework may not be worn into surgery.  Leave suitcase in the car. After surgery it may be brought to your room.  For patients admitted to the hospital, checkout time is 11:00 AM the day of discharge.   Patients discharged the day of surgery will not be allowed to drive home.    Special Instructions: Shower using CHG night before surgery and shower the day of surgery use CHG.  Use special wash - you have one bottle of CHG for all showers.  You should use approximately 1/2 of the bottle for each shower. How to Use Chlorhexidine Before Surgery Chlorhexidine gluconate (CHG) is a germ-killing (antiseptic) solution that is used to clean the skin. It can get rid of the bacteria that normally live on the skin and can keep them away for about 24 hours. To clean your skin with CHG, you may be given: A CHG solution to use in the shower or as part of a sponge bath. A prepackaged cloth that contains CHG. Cleaning your skin with CHG may help lower the risk for infection: While you are staying in the intensive care unit of the hospital. If you have a vascular access, such as a central line, to provide short-term or long-term access to your veins. If you have a catheter to drain urine from your bladder. If you are on a ventilator. A ventilator is a machine that helps you breathe by moving air in and out  of your lungs. After surgery. What are the risks? Risks of using CHG include: A skin reaction. Hearing loss, if CHG gets in your ears and you have a perforated eardrum. Eye injury, if CHG gets in your eyes and is not rinsed out. The CHG product catching fire. Make sure that you avoid smoking and flames after applying CHG to your skin. Do not use CHG: If you have a chlorhexidine allergy or have previously reacted to chlorhexidine. On babies younger than 37 months of age. How to use CHG solution Use CHG only as told by your health care provider, and follow the instructions on the label. Use the full amount of CHG as directed. Usually, this is one bottle. During a shower Follow these steps when using CHG solution during a shower (unless your health care provider gives you different instructions): Start the shower. Use your normal soap and shampoo to wash your face and hair. Turn off the shower or move out of the shower stream. Pour the CHG onto a clean washcloth. Do not use any type of brush or rough-edged sponge. Starting at your neck, lather your body down to your toes. Make sure you follow these instructions: If you will be having surgery, pay special attention to the part of your body where you will be having surgery.  Scrub this area for at least 1 minute. Do not use CHG on your head or face. If the solution gets into your ears or eyes, rinse them well with water. Avoid your genital area. Avoid any areas of skin that have broken skin, cuts, or scrapes. Scrub your back and under your arms. Make sure to wash skin folds. Let the lather sit on your skin for 1-2 minutes or as long as told by your health care provider. Thoroughly rinse your entire body in the shower. Make sure that all body creases and crevices are rinsed well. Dry off with a clean towel. Do not put any substances on your body afterward--such as powder, lotion, or perfume--unless you are told to do so by your health care  provider. Only use lotions that are recommended by the manufacturer. Put on clean clothes or pajamas. If it is the night before your surgery, sleep in clean sheets.  During a sponge bath Follow these steps when using CHG solution during a sponge bath (unless your health care provider gives you different instructions): Use your normal soap and shampoo to wash your face and hair. Pour the CHG onto a clean washcloth. Starting at your neck, lather your body down to your toes. Make sure you follow these instructions: If you will be having surgery, pay special attention to the part of your body where you will be having surgery. Scrub this area for at least 1 minute. Do not use CHG on your head or face. If the solution gets into your ears or eyes, rinse them well with water. Avoid your genital area. Avoid any areas of skin that have broken skin, cuts, or scrapes. Scrub your back and under your arms. Make sure to wash skin folds. Let the lather sit on your skin for 1-2 minutes or as long as told by your health care provider. Using a different clean, wet washcloth, thoroughly rinse your entire body. Make sure that all body creases and crevices are rinsed well. Dry off with a clean towel. Do not put any substances on your body afterward--such as powder, lotion, or perfume--unless you are told to do so by your health care provider. Only use lotions that are recommended by the manufacturer. Put on clean clothes or pajamas. If it is the night before your surgery, sleep in clean sheets. How to use CHG prepackaged cloths Only use CHG cloths as told by your health care provider, and follow the instructions on the label. Use the CHG cloth on clean, dry skin. Do not use the CHG cloth on your head or face unless your health care provider tells you to. When washing with the CHG cloth: Avoid your genital area. Avoid any areas of skin that have broken skin, cuts, or scrapes. Before surgery Follow these steps  when using a CHG cloth to clean before surgery (unless your health care provider gives you different instructions): Using the CHG cloth, vigorously scrub the part of your body where you will be having surgery. Scrub using a back-and-forth motion for 3 minutes. The area on your body should be completely wet with CHG when you are done scrubbing. Do not rinse. Discard the cloth and let the area air-dry. Do not put any substances on the area afterward, such as powder, lotion, or perfume. Put on clean clothes or pajamas. If it is the night before your surgery, sleep in clean sheets.  For general bathing Follow these steps when using CHG cloths for general bathing (unless your health care  provider gives you different instructions). Use a separate CHG cloth for each area of your body. Make sure you wash between any folds of skin and between your fingers and toes. Wash your body in the following order, switching to a new cloth after each step: The front of your neck, shoulders, and chest. Both of your arms, under your arms, and your hands. Your stomach and groin area, avoiding the genitals. Your right leg and foot. Your left leg and foot. The back of your neck, your back, and your buttocks. Do not rinse. Discard the cloth and let the area air-dry. Do not put any substances on your body afterward--such as powder, lotion, or perfume--unless you are told to do so by your health care provider. Only use lotions that are recommended by the manufacturer. Put on clean clothes or pajamas. Contact a health care provider if: Your skin gets irritated after scrubbing. You have questions about using your solution or cloth. You swallow any chlorhexidine. Call your local poison control center ([phone] in the [place] Get help right away if: Your eyes itch badly, or they become very red or swollen. Your skin itches badly and is red or swollen. Your hearing changes. You have trouble seeing. You have swelling or  tingling in your mouth or throat. You have trouble breathing. These symptoms may represent a serious problem that is an emergency. Do not wait to see if the symptoms will go away. Get medical help right away. Call your local emergency services (911 in the [place] Do not drive yourself to the hospital. Summary Chlorhexidine gluconate (CHG) is a germ-killing (antiseptic) solution that is used to clean the skin. Cleaning your skin with CHG may help to lower your risk for infection. You may be given CHG to use for bathing. It may be in a bottle or in a prepackaged cloth to use on your skin. Carefully follow your health care provider's instructions and the instructions on the product label. Do not use CHG if you have a chlorhexidine allergy. Contact your health care provider if your skin gets irritated after scrubbing. This information is not intended to replace advice given to you by your health care provider. Make sure you discuss any questions you have with your health care provider. Document Revised: 02/26/2022 Document Reviewed: 01/09/2021 Elsevier Patient Education  2023 Elsevier Inc. Open Hernia Repair, Adult, Care After What can I expect after the procedure? After the procedure, it is common to have: Mild discomfort. Slight bruising. Mild swelling. Pain in the belly (abdomen). A small amount of blood from the cut from surgery (incision). Follow these instructions at home: Your doctor may give you more specific instructions. If you have problems, call your doctor. Medicines Take over-the-counter and prescription medicines only as told by your doctor. If told, take steps to prevent problems with pooping (constipation). You may need to: Drink enough fluid to keep your pee (urine) pale yellow. Take medicines. You will be told what medicines to take. Eat foods that are high in fiber. These include beans, whole grains, and fresh fruits and vegetables. Limit foods that are high in fat and  sugar. These include fried or sweet foods. Ask your doctor if you should avoid driving or using machines while you are taking your medicine. Incision care  Follow instructions from your doctor about how to take care of your incision. Make sure you: Wash your hands with soap and water for at least 20 seconds before and after you change your bandage (dressing). If you  cannot use soap and water, use hand sanitizer. Change your bandage. Leave stitches or skin glue in place for at least 2 weeks. Leave tape strips alone unless you are told to take them off. You may trim the edges of the tape strips if they curl up. Check your incision every day for signs of infection. Check for: More redness, swelling, or pain. More fluid or blood. Warmth. Pus or a bad smell. Wear loose, soft clothing while your incision heals. Activity  Rest as told by your doctor. Do not lift anything that is heavier than 10 lb (4.5 kg), or the limit that you are told. Do not play contact sports until your doctor says that this is safe. If you were given a sedative during your procedure, do not drive or use machines until your doctor says that it is safe. A sedative is a medicine that helps you relax. Return to your normal activities when your doctor says that it is safe. General instructions Do not take baths, swim, or use a hot tub. Ask your doctor about taking showers or sponge baths. Hold a pillow over your belly when you cough or sneeze. This helps with pain. Do not smoke or use any products that contain nicotine or tobacco. If you need help quitting, ask your doctor. Keep all follow-up visits. Contact a doctor if: You have any of these signs of infection in or around your incision: More redness, swelling, or pain. More fluid or blood. Warmth. Pus. A bad smell. You have a fever or chills. You have blood in your poop (stool). You have not pooped (had a bowel movement) in 2-3 days. Medicine does not help your  pain. Get help right away if: You have chest pain, or you are short of breath. You feel faint or light-headed. You have very bad pain. You vomit and your pain is worse. You have pain, swelling, or redness in a leg. These symptoms may be an emergency. Get help right away. Call your local emergency services (911 in the [place] Do not wait to see if the symptoms will go away. Do not drive yourself to the hospital. Summary After this procedure, it is common to have mild discomfort, slight bruising, and mild swelling. Follow instructions from your doctor about how to take care of your cut from surgery (incision). Check every day for signs of infection. Do not lift heavy objects or play contact sports until your doctor says it is safe. Return to your normal activities as told by your doctor. This information is not intended to replace advice given to you by your health care provider. Make sure you discuss any questions you have with your health care provider. Document Revised: 06/13/2020 Document Reviewed: 06/13/2020 Elsevier Patient Education  2023 Elsevier Inc. General Anesthesia, Adult, Care After The following information offers guidance on how to care for yourself after your procedure. Your health care provider may also give you more specific instructions. If you have problems or questions, contact your health care provider. What can I expect after the procedure? After the procedure, it is common for people to: Have pain or discomfort at the IV site. Have nausea or vomiting. Have a sore throat or hoarseness. Have trouble concentrating. Feel cold or chills. Feel weak, sleepy, or tired (fatigue). Have soreness and body aches. These can affect parts of the body that were not involved in surgery. Follow these instructions at home: For the time period you were told by your health care provider:  Rest. Do  not participate in activities where you could fall or become injured. Do not drive or  use machinery. Do not drink alcohol. Do not take sleeping pills or medicines that cause drowsiness. Do not make important decisions or sign legal documents. Do not take care of children on your own. General instructions Drink enough fluid to keep your urine pale yellow. If you have sleep apnea, surgery and certain medicines can increase your risk for breathing problems. Follow instructions from your health care provider about wearing your sleep device: Anytime you are sleeping, including during daytime naps. While taking prescription pain medicines, sleeping medicines, or medicines that make you drowsy. Return to your normal activities as told by your health care provider. Ask your health care provider what activities are safe for you. Take over-the-counter and prescription medicines only as told by your health care provider. Do not use any products that contain nicotine or tobacco. These products include cigarettes, chewing tobacco, and vaping devices, such as e-cigarettes. These can delay incision healing after surgery. If you need help quitting, ask your health care provider. Contact a health care provider if: You have nausea or vomiting that does not get better with medicine. You vomit every time you eat or drink. You have pain that does not get better with medicine. You cannot urinate or have bloody urine. You develop a skin rash. You have a fever. Get help right away if: You have trouble breathing. You have chest pain. You vomit blood. These symptoms may be an emergency. Get help right away. Call 911. Do not wait to see if the symptoms will go away. Do not drive yourself to the hospital. Summary After the procedure, it is common to have a sore throat, hoarseness, nausea, vomiting, or to feel weak, sleepy, or fatigue. For the time period you were told by your health care provider, do not drive or use machinery. Get help right away if you have difficulty breathing, have chest pain,  or vomit blood. These symptoms may be an emergency. This information is not intended to replace advice given to you by your health care provider. Make sure you discuss any questions you have with your health care provider. Document Revised: 01/26/2022 Document Reviewed: 01/26/2022 Elsevier Patient Education  2023 [employer].

## 2023-03-20 ENCOUNTER — Encounter (HOSPITAL_COMMUNITY)
Admission: RE | Admit: 2023-03-20 | Discharge: 2023-03-20 | Disposition: A | Discharge status: Home / Self Care | Payer: Medicare Other | Source: Ambulatory Visit | Attending: Surgery | Admitting: Surgery

## 2023-03-20 ENCOUNTER — Encounter (HOSPITAL_COMMUNITY): Payer: Self-pay

## 2023-03-20 VITALS — BP 108/64 | HR 60 | Temp 97.5°F | Resp 18 | Ht 72.0 in | Wt 149.0 lb

## 2023-03-20 DIAGNOSIS — Z01818 Encounter for other preprocedural examination: Secondary | ICD-10-CM

## 2023-03-20 DIAGNOSIS — Z01812 Encounter for preprocedural laboratory examination: Secondary | ICD-10-CM | POA: Insufficient documentation

## 2023-03-20 DIAGNOSIS — E119 Type 2 diabetes mellitus without complications: Secondary | ICD-10-CM | POA: Insufficient documentation

## 2023-03-20 HISTORY — DX: Cardiac arrhythmia, unspecified: I49.9

## 2023-03-20 HISTORY — DX: Presence of cardiac pacemaker: Z95.0

## 2023-03-20 LAB — HEMOGLOBIN A1C
Hgb A1c MFr Bld: 7.9 % — ABNORMAL HIGH (ref 4.8–5.6)
Mean Plasma Glucose: 180.03 mg/dL

## 2023-03-20 LAB — BASIC METABOLIC PANEL
Anion gap: 10 (ref 5–15)
BUN: 21 mg/dL (ref 8–23)
CO2: 23 mmol/L (ref 22–32)
Calcium: 8.9 mg/dL (ref 8.9–10.3)
Chloride: 105 mmol/L (ref 98–111)
Creatinine, Ser: 1.06 mg/dL (ref 0.61–1.24)
GFR, Estimated: >60 mL/min (ref >60)
Glucose, Bld: 94 mg/dL (ref 70–99)
Potassium: 4.1 mmol/L (ref 3.5–5.1)
Sodium: 138 mmol/L (ref 135–145)

## 2023-03-20 LAB — CBC WITH DIFFERENTIAL/PLATELET
Abs Immature Granulocytes: 0.01 10*3/uL (ref 0.00–0.07)
Basophils Absolute: 0.1 10*3/uL (ref 0.0–0.1)
Basophils Relative: 1 %
Eosinophils Absolute: 0.4 10*3/uL (ref 0.0–0.5)
Eosinophils Relative: 6 %
HCT: 36.1 % — ABNORMAL LOW (ref 39.0–52.0)
Hemoglobin: 10.6 g/dL — ABNORMAL LOW (ref 13.0–17.0)
Immature Granulocytes: 0 %
Lymphocytes Relative: 22 %
Lymphs Abs: 1.5 10*3/uL (ref 0.7–4.0)
MCH: 20.6 pg — ABNORMAL LOW (ref 26.0–34.0)
MCHC: 29.4 g/dL — ABNORMAL LOW (ref 30.0–36.0)
MCV: 70.1 fL — ABNORMAL LOW (ref 80.0–100.0)
Monocytes Absolute: 0.5 10*3/uL (ref 0.1–1.0)
Monocytes Relative: 7 %
Neutro Abs: 4.4 10*3/uL (ref 1.7–7.7)
Neutrophils Relative %: 64 %
Platelets: 326 10*3/uL (ref 150–400)
RBC: 5.15 MIL/uL (ref 4.22–5.81)
RDW: 20.2 % — ABNORMAL HIGH (ref 11.5–15.5)
WBC: 6.8 10*3/uL (ref 4.0–10.5)
nRBC: 0 % (ref 0.0–0.2)

## 2023-03-21 ENCOUNTER — Ambulatory Visit (HOSPITAL_COMMUNITY): Payer: Medicare Other | Admitting: Certified Registered"

## 2023-03-21 ENCOUNTER — Other Ambulatory Visit: Payer: Self-pay

## 2023-03-21 ENCOUNTER — Encounter (HOSPITAL_COMMUNITY)
Admission: RE | Disposition: A | Discharge status: Home / Self Care | Payer: Self-pay | Source: Ambulatory Visit | Attending: Surgery

## 2023-03-21 ENCOUNTER — Ambulatory Visit (HOSPITAL_BASED_OUTPATIENT_CLINIC_OR_DEPARTMENT_OTHER): Payer: Medicare Other | Admitting: Certified Registered"

## 2023-03-21 ENCOUNTER — Ambulatory Visit (HOSPITAL_COMMUNITY)
Admission: RE | Admit: 2023-03-21 | Discharge: 2023-03-21 | Disposition: A | Discharge status: Home / Self Care | Payer: Medicare Other | Source: Ambulatory Visit | Attending: Surgery | Admitting: Surgery

## 2023-03-21 DIAGNOSIS — K4090 Unilateral inguinal hernia, without obstruction or gangrene, not specified as recurrent: Secondary | ICD-10-CM | POA: Insufficient documentation

## 2023-03-21 DIAGNOSIS — K449 Diaphragmatic hernia without obstruction or gangrene: Secondary | ICD-10-CM | POA: Diagnosis not present

## 2023-03-21 DIAGNOSIS — E119 Type 2 diabetes mellitus without complications: Secondary | ICD-10-CM | POA: Diagnosis not present

## 2023-03-21 DIAGNOSIS — I442 Atrioventricular block, complete: Secondary | ICD-10-CM | POA: Diagnosis not present

## 2023-03-21 DIAGNOSIS — Z87891 Personal history of nicotine dependence: Secondary | ICD-10-CM | POA: Insufficient documentation

## 2023-03-21 DIAGNOSIS — Z950 Presence of cardiac pacemaker: Secondary | ICD-10-CM | POA: Diagnosis not present

## 2023-03-21 DIAGNOSIS — K219 Gastro-esophageal reflux disease without esophagitis: Secondary | ICD-10-CM | POA: Insufficient documentation

## 2023-03-21 DIAGNOSIS — Z8546 Personal history of malignant neoplasm of prostate: Secondary | ICD-10-CM | POA: Diagnosis not present

## 2023-03-21 DIAGNOSIS — E785 Hyperlipidemia, unspecified: Secondary | ICD-10-CM | POA: Insufficient documentation

## 2023-03-21 DIAGNOSIS — Z7984 Long term (current) use of oral hypoglycemic drugs: Secondary | ICD-10-CM | POA: Diagnosis not present

## 2023-03-21 HISTORY — PX: INGUINAL HERNIA REPAIR: SHX194

## 2023-03-21 LAB — GLUCOSE, CAPILLARY
Glucose-Capillary: 98 mg/dL (ref 70–99)
Glucose-Capillary: 102 mg/dL — ABNORMAL HIGH (ref 70–99)

## 2023-03-21 SURGERY — REPAIR, HERNIA, INGUINAL, ADULT
Anesthesia: General | Site: Groin | Laterality: Right

## 2023-03-21 MED ORDER — FENTANYL CITRATE (PF) 100 MCG/2ML IJ SOLN
INTRAMUSCULAR | Status: AC
  Filled 2023-03-21: qty 2

## 2023-03-21 MED ORDER — FENTANYL CITRATE (PF) 100 MCG/2ML IJ SOLN
INTRAMUSCULAR | Status: DC | PRN
  Administered 2023-03-21 (×2): 50 ug via INTRAVENOUS

## 2023-03-21 MED ORDER — ONDANSETRON HCL 4 MG/2ML IJ SOLN
INTRAMUSCULAR | Status: DC | PRN
  Administered 2023-03-21: 4 mg via INTRAVENOUS

## 2023-03-21 MED ORDER — ONDANSETRON HCL 4 MG/2ML IJ SOLN
INTRAMUSCULAR | Status: AC
  Filled 2023-03-21: qty 2

## 2023-03-21 MED ORDER — DOCUSATE SODIUM 100 MG PO CAPS: 100.0000 mg | ORAL_CAPSULE | Freq: Two times a day (BID) | ORAL | 2 refills | Status: DC

## 2023-03-21 MED ORDER — SUGAMMADEX SODIUM 200 MG/2ML IV SOLN
INTRAVENOUS | Status: DC | PRN
  Administered 2023-03-21: 200 mg via INTRAVENOUS

## 2023-03-21 MED ORDER — BUPIVACAINE LIPOSOME 1.3 % IJ SUSP
INTRAMUSCULAR | Status: AC
  Filled 2023-03-21: qty 20

## 2023-03-21 MED ORDER — LIDOCAINE HCL (PF) 2 % IJ SOLN
INTRAMUSCULAR | Status: DC | PRN
  Administered 2023-03-21: 80 mg via INTRADERMAL

## 2023-03-21 MED ORDER — OXYCODONE HCL 5 MG/5ML PO SOLN: 5.0000 mg | Freq: Once | ORAL | Status: DC | PRN

## 2023-03-21 MED ORDER — LIDOCAINE HCL (PF) 2 % IJ SOLN
INTRAMUSCULAR | Status: AC
  Filled 2023-03-21: qty 5

## 2023-03-21 MED ORDER — PROPOFOL 10 MG/ML IV BOLUS
INTRAVENOUS | Status: AC
  Filled 2023-03-21: qty 20

## 2023-03-21 MED ORDER — PHENYLEPHRINE 80 MCG/ML (10ML) SYRINGE FOR IV PUSH (FOR BLOOD PRESSURE SUPPORT)
PREFILLED_SYRINGE | INTRAVENOUS | Status: AC
  Filled 2023-03-21: qty 10

## 2023-03-21 MED ORDER — BUPIVACAINE HCL (PF) 0.5 % IJ SOLN
INTRAMUSCULAR | Status: DC | PRN
  Administered 2023-03-21: 30 mL

## 2023-03-21 MED ORDER — CHLORHEXIDINE GLUCONATE CLOTH 2 % EX PADS: 6.0000 | MEDICATED_PAD | Freq: Once | CUTANEOUS | Status: DC

## 2023-03-21 MED ORDER — PROPOFOL 10 MG/ML IV BOLUS
INTRAVENOUS | Status: DC | PRN
  Administered 2023-03-21: 180 mg via INTRAVENOUS

## 2023-03-21 MED ORDER — ORAL CARE MOUTH RINSE: 15.0000 mL | Freq: Once | OROMUCOSAL | Status: AC

## 2023-03-21 MED ORDER — ROCURONIUM BROMIDE 10 MG/ML (PF) SYRINGE
PREFILLED_SYRINGE | INTRAVENOUS | Status: AC
  Filled 2023-03-21: qty 10

## 2023-03-21 MED ORDER — LACTATED RINGERS IV SOLN: INTRAVENOUS | Status: DC

## 2023-03-21 MED ORDER — ACETAMINOPHEN 500 MG PO TABS: 1000.0000 mg | ORAL_TABLET | Freq: Four times a day (QID) | ORAL | 0 refills | Status: AC

## 2023-03-21 MED ORDER — OXYCODONE HCL 5 MG PO TABS: 5.0000 mg | ORAL_TABLET | Freq: Four times a day (QID) | ORAL | 0 refills | Status: DC | PRN

## 2023-03-21 MED ORDER — ONDANSETRON HCL 4 MG/2ML IJ SOLN: 4.0000 mg | Freq: Once | INTRAMUSCULAR | Status: DC | PRN

## 2023-03-21 MED ORDER — ROCURONIUM BROMIDE 10 MG/ML (PF) SYRINGE
PREFILLED_SYRINGE | INTRAVENOUS | Status: DC | PRN
  Administered 2023-03-21: 50 mg via INTRAVENOUS
  Administered 2023-03-21: 10 mg via INTRAVENOUS

## 2023-03-21 MED ORDER — SODIUM CHLORIDE 0.9 % IR SOLN
Status: DC | PRN
  Administered 2023-03-21: 1000 mL

## 2023-03-21 MED ORDER — SEVOFLURANE IN SOLN
RESPIRATORY_TRACT | Status: AC
  Filled 2023-03-21: qty 250

## 2023-03-21 MED ORDER — BUPIVACAINE HCL (PF) 0.5 % IJ SOLN
INTRAMUSCULAR | Status: AC
  Filled 2023-03-21: qty 30

## 2023-03-21 MED ORDER — CHLORHEXIDINE GLUCONATE 0.12 % MT SOLN
15.0000 mL | Freq: Once | OROMUCOSAL | Status: AC
  Administered 2023-03-21: 15 mL via OROMUCOSAL

## 2023-03-21 MED ORDER — CEFAZOLIN SODIUM-DEXTROSE 2-4 GM/100ML-% IV SOLN
2.0000 g | INTRAVENOUS | Status: AC
  Administered 2023-03-21: 2 g via INTRAVENOUS
  Filled 2023-03-21: qty 100

## 2023-03-21 MED ORDER — PHENYLEPHRINE HCL (PRESSORS) 10 MG/ML IV SOLN
INTRAVENOUS | Status: DC | PRN
  Administered 2023-03-21: 80 ug via INTRAVENOUS
  Administered 2023-03-21: 40 ug via INTRAVENOUS
  Administered 2023-03-21: 80 ug via INTRAVENOUS
  Administered 2023-03-21: 40 ug via INTRAVENOUS
  Administered 2023-03-21 (×2): 80 ug via INTRAVENOUS

## 2023-03-21 MED ORDER — OXYCODONE HCL 5 MG PO TABS: 5.0000 mg | ORAL_TABLET | Freq: Once | ORAL | Status: DC | PRN

## 2023-03-21 MED ORDER — FENTANYL CITRATE PF 50 MCG/ML IJ SOSY: 25.0000 ug | PREFILLED_SYRINGE | INTRAMUSCULAR | Status: DC | PRN

## 2023-03-21 SURGICAL SUPPLY — 32 items
ADH SKN CLS APL DERMABOND .7 (GAUZE/BANDAGES/DRESSINGS) ×1
CLOTH BEACON ORANGE TIMEOUT ST (SAFETY) ×1 IMPLANT
COVER LIGHT HANDLE STERIS (MISCELLANEOUS) ×2 IMPLANT
DERMABOND ADVANCED .7 DNX12 (GAUZE/BANDAGES/DRESSINGS) ×1 IMPLANT
DRAIN PENROSE 0.5X18 (DRAIN) ×1 IMPLANT
ELECT REM PT RETURN 9FT ADLT (ELECTROSURGICAL) ×1
ELECTRODE REM PT RTRN 9FT ADLT (ELECTROSURGICAL) ×1 IMPLANT
GAUZE SPONGE 4X4 12PLY STRL (GAUZE/BANDAGES/DRESSINGS) ×1 IMPLANT
GLOVE BIO SURGEON STRL SZ7 (GLOVE) IMPLANT
GLOVE BIOGEL PI IND STRL 6.5 (GLOVE) ×1 IMPLANT
GLOVE BIOGEL PI IND STRL 7.0 (GLOVE) ×2 IMPLANT
GLOVE SURG SS PI 6.5 STRL IVOR (GLOVE) ×2 IMPLANT
GOWN STRL REUS W/TWL LRG LVL3 (GOWN DISPOSABLE) ×3 IMPLANT
INST SET MINOR GENERAL (KITS) ×1 IMPLANT
KIT TURNOVER KIT A (KITS) ×1 IMPLANT
MANIFOLD NEPTUNE II (INSTRUMENTS) ×1 IMPLANT
MESH PRE-SHAPE KEYHOLE 1.8X4 (Mesh General) IMPLANT
NS IRRIG 1000ML POUR BTL (IV SOLUTION) ×1 IMPLANT
PACK MINOR (CUSTOM PROCEDURE TRAY) ×1 IMPLANT
PAD ARMBOARD 7.5X6 YLW CONV (MISCELLANEOUS) ×1 IMPLANT
SET BASIN LINEN APH (SET/KITS/TRAYS/PACK) ×1 IMPLANT
SOL PREP PROV IODINE SCRUB 4OZ (MISCELLANEOUS) ×1 IMPLANT
SUT MNCRL AB 4-0 PS2 18 (SUTURE) ×1 IMPLANT
SUT NOVA NAB GS-22 2 2-0 T-19 (SUTURE) IMPLANT
SUT SILK 2 0 FSL 18 (SUTURE) ×1 IMPLANT
SUT SILK 3 0 (SUTURE)
SUT SILK 3-0 18XBRD TIE 12 (SUTURE) IMPLANT
SUT VIC AB 2-0 CT1 27 (SUTURE) ×1
SUT VIC AB 2-0 CT1 TAPERPNT 27 (SUTURE) ×1 IMPLANT
SUT VIC AB 3-0 SH 27 (SUTURE) ×2
SUT VIC AB 3-0 SH 27X BRD (SUTURE) ×1 IMPLANT
SUT VICRYL AB 3 0 TIES (SUTURE) IMPLANT

## 2023-03-21 NOTE — Progress Notes (Signed)
[facility]  Spoke with the patient's wife in the consultation room.  I explained that he tolerated the procedure without difficulty.  He has dissolvable stitches under the skin with overlying skin glue.  This will flake off in 10 to 14 days.  I discharged him home with a prescription for narcotic pain medication that they should take as needed for pain.  I also want him taking scheduled Tylenol.  If they take the narcotic pain medication, they should take a stool softener as well.  The patient will follow-up with me in 2 weeks.  All questions were answered to her expressed satisfaction.  Zulma Court, DO [facility] [address] [place], [place] [address] [phone] (office)  

## 2023-03-21 NOTE — Op Note (Signed)
[facility] Operative Note  03/21/23  Preoperative Diagnosis: Right inguinal hernia    Postoperative Diagnosis: Same   Procedure(s) Performed: Open right inguinal hernia repair with mesh   Surgeon: Theophilus Kinds, DO    Assistants: Cecile Sheerer, RNFA   Anesthesia: General endotracheal   Anesthesiologist: Windell Norfolk, MD    Specimens: None   Estimated Blood Loss: Minimal   Blood Replacement: None    Complications: None   Wound Class:Clean   Operative Indications: Patient is an 89 year old male who presents for open right inguinal hernia repair with mesh.  He was recently noted to have an inguinal hernia, and it is starting to cause him pain, so he would like it repaired at this time.  He is agreeable to surgery.  All risks and benefits of performing this procedure were discussed with the patient including pain, infection, bleeding, damage to the surrounding structures, and need for more procedures or surgery. The patient voiced understanding of the procedure, all questions were sought and answered, and consent was obtained.  Findings: Indirect right inguinal hernia with weakening of the inguinal floor   Procedure: The patient was taken to the operating room and placed supine. General endotracheal anesthesia was induced. Intravenous antibiotics were administered per protocol.  A time out was preformed verifying the correct patient, procedure, site, positioning and implants.  The right groin and scrotum were prepared and draped in the usual sterile fashion.   An incision was made in a natural skin crease between the pubic tubercle and the anterior superior iliac spine.  The incision was deepened with electrocautery through Scarpa's and Camper's fascia until the aponeurosis of the external oblique was encountered.  This was cleaned and the external ring was exposed.  An incision was made in the midportion of the external oblique aponeurosis in the  direction of its fibers. The ilioinguinal nerve was not identified.  Flaps of the external oblique were developed cephalad and inferiorly.    The cord was identified and it was gently dissented free at the pubic tubercle and encircled with a Penrose drain.  Attention was then directed at the anteromedial aspect of the cord, where an indirect hernia sac was identified.  The sac was carefully dissected free from the cord down to the level of the internal ring.  The vas and testicular vessels were identified and protected from harm.  Once the sac was dissected free from the cords, the Penrose was placed around the cord which was retracted inferiorly out of the field of view.  The sac was entered and noted to contain fat, which was reduced.  The hernia sac was closed with purse string stitch. The hernia was reduced into the internal ring without difficulty.  The cord lipoma was excised.  Attention was then turned to the floor of the canal, which was grossly weakened without any defined defect or sac.  The Bard Keyhole Mesh was sutured to the inguinal ligament inferiorly starting at the pubic tubercle using 2-0 Novafil interrupted sutures.  The mesh was sutured superiorly to the conjoint tendon using 2-0 Novafil interrupted sutures.  Care was taken to ensure the mesh was placed in a relaxed fashion to avoid excessive tension and no neurovascular structures were caught in the repair.  Laterally the tails of the mesh were crossed and the internal ring was recreated, allowing for passage of cords without tension.   Hemostasis was adequate.  The Penrose was removed.  The incision was localized with marcaine.  The external oblique aponeurosis was closed with a 2-0 Vicryl suture in a running fashion, taking care to not catch the ilioinguinal nerve in the suture line.  Scarpa's fashion was closed with 3-0 Vicryl suture in a running fashion.  The dermis was closed with interrupted 3-0 Vicryl sutures. The skin was closed with  a subcuticular 4-0 Monocryl suture.  Dermabond was applied.   The testis was gently pulled down into its anatomic position in the scrotum.  The patient tolerated the procedure well and was taken to the PACU in stable condition. All counts were correct at the end of the case.        Theophilus Kinds, DO  [facility] [address] [place], [place] [address] [phone] (office)

## 2023-03-21 NOTE — Discharge Instructions (Signed)
Ambulatory Surgery Discharge Instructions  General Anesthesia or Sedation Do not drive or operate heavy machinery for 24 hours.  Do not consume alcohol, tranquilizers, sleeping medications, or any non-prescribed medications for 24 hours. Do not make important decisions or sign any important papers in the next 24 hours. You should have someone with you tonight at home.  Activity  You are advised to go directly home from the hospital.  Restrict your activities and rest for a day.  Resume light activity tomorrow. No heavy lifting over 10 lbs or strenuous exercise.  Fluids and Diet Begin with clear liquids, bouillon, dry toast, soda crackers.  If not nauseated, you may go to a regular diet when you desire.  Greasy and spicy foods are not advised.  Medications  If you have not had a bowel movement in 24 hours, take 2 tablespoons over the counter Milk of mag.             You May resume your blood thinners tomorrow (Aspirin, coumadin, or other).  You are being discharged with prescriptions for Opioid/Narcotic Medications: There are some specific considerations for these medications that you should know. Opioid Meds have risks & benefits. Addiction to these meds is always a concern with prolonged use Take medication only as directed Do not drive while taking narcotic pain medication Do not crush tablets or capsules Do not use a different container than medication was dispensed in Lock the container of medication in a cool, dry place out of reach of children and pets. Opioid medication can cause addiction Do not share with anyone else (this is a felony) Do not store medications for future use. Dispose of them properly.     Disposal:  Find a [facility] household drug take back site near you.  If you can't get to a drug take back site, use the recipe below as a last resort to dispose of expired, unused or unwanted drugs. Disposal  (Do not dispose chemotherapy drugs this way, talk to your  prescribing doctor instead.) Step 1: Mix drugs (do not crush) with dirt, kitty litter, or used coffee grounds and add a small amount of water to dissolve any solid medications. Step 2: Seal drugs in plastic bag. Step 3: Place plastic bag in trash. Step 4: Take prescription container and scratch out personal information, then recycle or throw away.  Operative Site  You have a liquid bandage over your incisions, this will begin to flake off in about a week. Ok to shower tomorrow. Keep wound clean and dry. No baths or swimming. No lifting more than 10 pounds.  Contact Information: If you have questions or concerns, please call our office, [phone], Monday- Thursday 8AM-5PM and Friday 8AM-12Noon.  If it is after hours or on the weekend, please call [facility] Main Number, [phone], and ask to speak to the surgeon on call for Dr. Khalia Gong at [place].   SPECIFIC COMPLICATIONS TO WATCH FOR: Inability to urinate Fever over 101? F by mouth Nausea and vomiting lasting longer than 24 hours. Pain not relieved by medication ordered Swelling around the operative site Increased redness, warmth, hardness, around operative area Numbness, tingling, or cold fingers or toes Blood -soaked dressing, (small amounts of oozing may be normal) Increasing and progressive drainage from surgical area or exam site  

## 2023-03-21 NOTE — Transfer of Care (Signed)
Immediate Anesthesia Transfer of Care Note  Patient: Jose Chase  Procedure(s) Performed: HERNIA REPAIR INGUINAL ADULT W/ MESH (Right: Groin)  Patient Location: PACU  Anesthesia Type:General  Level of Consciousness: drowsy and patient cooperative  Airway & Oxygen Therapy: Patient Spontanous Breathing and Patient connected to face mask oxygen  Post-op Assessment: Report given to RN and Post -op Vital signs reviewed and stable  Post vital signs: Reviewed and stable  Last Vitals:  Vitals Value Taken Time  BP 142/76 03/21/23 0908  Temp    Pulse 59 03/21/23 0910  Resp 12 03/21/23 0910  SpO2 100 % 03/21/23 0910  Vitals shown include unvalidated device data.  Last Pain:  Vitals:   03/21/23 0648  TempSrc: Oral  PainSc: 0-No pain      Patients Stated Pain Goal: 5 (03/21/23 1610)  Complications: No notable events documented.

## 2023-03-21 NOTE — Anesthesia Procedure Notes (Signed)
Procedure Name: Intubation Date/Time: 03/21/2023 7:44 AM  Performed by: Oletha Cruel, CRNAPre-anesthesia Checklist: Patient identified, Emergency Drugs available, Suction available, Patient being monitored and Timeout performed Patient Re-evaluated:Patient Re-evaluated prior to induction Oxygen Delivery Method: Circle system utilized Preoxygenation: Pre-oxygenation with 100% oxygen Induction Type: IV induction Ventilation: Mask ventilation without difficulty and Oral airway inserted - appropriate to patient size Laryngoscope Size: Mac and 4 Grade View: Grade I Tube type: Oral Endobronchial tube: Right Tube size: 7.0 mm Number of attempts: 1 Airway Equipment and Method: Stylet Placement Confirmation: ETT inserted through vocal cords under direct vision, positive ETCO2, breath sounds checked- equal and bilateral and CO2 detector Secured at: 22 cm Tube secured with: Tape Dental Injury: Teeth and Oropharynx as per pre-operative assessment  Comments: Patient edentulous.

## 2023-03-21 NOTE — Anesthesia Preprocedure Evaluation (Signed)
Anesthesia Evaluation  Patient identified by MRN, date of birth, ID band Patient awake    Reviewed: Allergy & Precautions, H&P , NPO status , Patient's Chart, lab work & pertinent test results, reviewed documented beta blocker date and time   Airway Mallampati: II  TM Distance: >3 FB Neck ROM: full    Dental no notable dental hx.    Pulmonary neg pulmonary ROS, shortness of breath, former smoker   Pulmonary exam normal breath sounds clear to auscultation       Cardiovascular Exercise Tolerance: Good negative cardio ROS + dysrhythmias + pacemaker  Rhythm:regular Rate:Normal     Neuro/Psych  Neuromuscular disease negative neurological ROS  negative psych ROS   GI/Hepatic negative GI ROS, Neg liver ROS, hiatal hernia,GERD  ,,  Endo/Other  negative endocrine ROSdiabetes    Renal/GU negative Renal ROS  negative genitourinary   Musculoskeletal   Abdominal   Peds  Hematology negative hematology ROS (+)   Anesthesia Other Findings   Reproductive/Obstetrics negative OB ROS                             Anesthesia Physical Anesthesia Plan  ASA: 2  Anesthesia Plan: General and General LMA   Post-op Pain Management:    Induction:   PONV Risk Score and Plan: Ondansetron  Airway Management Planned:   Additional Equipment:   Intra-op Plan:   Post-operative Plan:   Informed Consent: I have reviewed the patients History and Physical, chart, labs and discussed the procedure including the risks, benefits and alternatives for the proposed anesthesia with the patient or authorized representative who has indicated his/her understanding and acceptance.     Dental Advisory Given  Plan Discussed with: CRNA  Anesthesia Plan Comments:        Anesthesia Quick Evaluation

## 2023-03-21 NOTE — H&P (Signed)
[facility] History and Physical   Reason for Referral: Inguinal hernia Referring Physician: Dr. Felecia Shelling   Chief Complaint   New Patient (Initial Visit)        Jose Chase is a 89 y.o. male.  HPI: Patient presents for evaluation of a right inguinal hernia.  He believes it has been present for at least 2 years, but recently it has been increasing in size.  He does have occasional stinging pains associated with this bulge.  He has never tried to reduce it, but when he lays down, the hernia self reduces.  He is tolerating a diet without nausea and vomiting, moving his bowels without issue.  He is concerned about undergoing anesthesia, as he was scheduled for an esophageal dilation back in November, but was told that he had a complete heart block and required pacemaker placement.  His past medical history is significant for heart block, diabetes, GERD, and hyperlipidemia.  He denies use of blood thinning medications.  His surgical history is significant for prostate surgery for cancer in 1996, back surgeries, and a left inguinal hernia repair very long ago.  He denies use of tobacco products, alcohol, and illicit drugs.       Past Medical History:  Diagnosis Date   Aspirin long-term use     DM (diabetes mellitus)     Dyspnea     GERD (gastroesophageal reflux disease)     Hemorrhoids, internal 10/13/2007   Hiatal hernia DEC 2008 LARGE   Prostate cancer             Past Surgical History:  Procedure Laterality Date   BACK SURGERY       COLONOSCOPY   DEC 2008    COMPLICATED BY CONTAINED RECTAL PERFORATION   ESOPHAGOGASTRODUODENOSCOPY   05/29/2011    Procedure: ESOPHAGOGASTRODUODENOSCOPY (EGD);  Surgeon: Arlyce Harman, MD;  Location: [facility];  Service: Endoscopy;  Laterality: N/A;  Possible ED   ESOPHAGOGASTRODUODENOSCOPY N/A 02/07/2016    Procedure: ESOPHAGOGASTRODUODENOSCOPY (EGD);  Surgeon: West Bali, MD;  Location: [facility];  Service: Endoscopy;   Laterality: N/A;  200 - moved to 3/28 @ 10:30 - office notified pt   ESOPHAGOGASTRODUODENOSCOPY (EGD) WITH ESOPHAGEAL DILATION N/A 10/20/2013    WUJ:[id number] at the gastroesophageal junction/ Medium sized hiatal hernia/MILD Erosive gastritis. Biopsy showed chronic inflammation, no H. pylori, fragment of fundic gland polyp   LEAD REVISION/REPAIR N/A 10/03/2022    Procedure: LEAD REVISION/REPAIR;  Surgeon: Lanier Prude, MD;  Location: [facility] INVASIVE CV LAB;  Service: Cardiovascular;  Laterality: N/A;   MALONEY DILATION   05/29/2011    Procedure: Elease Hashimoto DILATION;  Surgeon: Arlyce Harman, MD;  Location: [facility];  Service: Endoscopy;  Laterality: N/A;   PACEMAKER IMPLANT N/A 08/13/2022    Procedure: PACEMAKER IMPLANT;  Surgeon: Lanier Prude, MD;  Location: [facility] INVASIVE CV LAB;  Service: Cardiovascular;  Laterality: N/A;   PROSTATE SURGERY        prostate cancer   SAVORY DILATION   05/29/2011    Procedure: SAVORY DILATION;  Surgeon: Arlyce Harman, MD;  Location: [facility];  Service: Endoscopy;  Laterality: N/A;   SAVORY DILATION N/A 02/07/2016    Procedure: SAVORY DILATION;  Surgeon: West Bali, MD;  Location: [facility];  Service: Endoscopy;  Laterality: N/A;   UPPER GASTROINTESTINAL ENDOSCOPY   DEC 2008    [id number] GASTRIC MUCOSA           Family History  Problem Relation Age of Onset   Heart disease Father     Heart disease Brother     Cancer Brother     Cancer Brother     Cancer Other     Cancer Sister     Colon cancer Neg Hx     Colon polyps Neg Hx        Social History         Tobacco Use   Smoking status: Former      Types: Cigarettes      Quit date: 01/11/1973      Years since quitting: 50.1   Smokeless tobacco: Never   Tobacco comments:      Never smoked very much. Occasional smoker-once or twice a week. Quit in 1974.  Vaping Use   Vaping Use: Never used  Substance Use Topics   Alcohol use: No      Alcohol/week: 0.0 standard drinks of alcohol    Drug use: No      Medications: I have reviewed the patient's current medications. Allergies as of 02/28/2023   No Known Allergies         Medication List           Accurate as of February 28, 2023 11:59 PM. If you have any questions, ask your nurse or doctor.              acetaminophen 500 MG tablet Commonly known as: TYLENOL Take 500 mg by mouth every 6 (six) hours as needed for mild pain or moderate pain.    Allergy Relief 10 MG tablet Generic drug: loratadine Take 10 mg by mouth daily.    gabapentin 100 MG capsule Commonly known as: NEURONTIN Take 100 mg by mouth at bedtime.    glipiZIDE 10 MG tablet Commonly known as: GLUCOTROL Take 10 mg by mouth daily before breakfast.    MEGA MULTI MEN PO Take 1 tablet by mouth daily.    metFORMIN 850 MG tablet Commonly known as: GLUCOPHAGE Take 1 tablet (850 mg total) by mouth 2 (two) times daily.    omeprazole 40 MG capsule Commonly known as: PRILOSEC Take 1 capsule (40 mg total) by mouth daily.    simvastatin 20 MG tablet Commonly known as: ZOCOR Take 20 mg by mouth at bedtime.               ROS:  Constitutional: negative for chills, fatigue, and fevers Eyes: negative for visual disturbance and pain Ears, nose, mouth, throat, and face: positive for sinus problems, negative for ear drainage and sore throat Respiratory: positive for shortness of breath, negative for cough and wheezing Cardiovascular: negative for chest pain and palpitations Gastrointestinal: positive for abdominal pain and nausea, negative for reflux symptoms and vomiting Genitourinary:negative for dysuria and frequency Integument/breast: negative for dryness and rash Hematologic/lymphatic: negative for bleeding and lymphadenopathy Musculoskeletal:negative for back pain and neck pain Neurological: positive for dizziness Endocrine: negative for temperature intolerance   Blood pressure 108/64, pulse 60, temperature (!) 97.5 F (36.4 C),  temperature source Oral, resp. rate 12, height 6' (1.829 m), weight 149 lb (67.6 kg), SpO2 98 %. Physical Exam Vitals reviewed.  Constitutional:      Appearance: Normal appearance.  HENT:     Head: Normocephalic and atraumatic.  Eyes:     Extraocular Movements: Extraocular movements intact.     Pupils: Pupils are equal, round, and reactive to light.  Cardiovascular:     Rate and Rhythm: Normal rate and regular rhythm.  Pulmonary:  Effort: Pulmonary effort is normal.     Breath sounds: Normal breath sounds.  Abdominal:     Comments: Abdomen soft, nondistended, no percussion tenderness, nontender to palpation; no rigidity, guarding, rebound tenderness; soft and reducible right inguinal hernia, midline cicatrix from prostate surgery, scar from open left inguinal hernia repair, no evidence of left inguinal hernia  Musculoskeletal:        General: Normal range of motion.     Cervical back: Normal range of motion.  Skin:    General: Skin is warm and dry.  Neurological:     General: No focal deficit present.     Mental Status: He is alert and oriented to person, place, and time.  Psychiatric:        Mood and Affect: Mood normal.        Behavior: Behavior normal.        Results: Lab Results Last 48 Hours  No results found for this or any previous visit (from the past 48 hour(s)).     Imaging Results (Last 48 hours)  No results found.       Assessment & Plan:  LANCELOT LAPEYROUSE is a 89 y.o. male who presents for evaluation of a right inguinal hernia   -I explained the pathophysiology of hernias, and why we recommend surgical repair. -The risk and benefits of open right inguinal hernia were discussed including but not limited to bleeding, infection, injury to surrounding structures, need for additional procedures, hernia recurrence, and lung or heart complication from anesthesia.   -Patient cleared for surgery from cardiology standpoint -Patient tentatively scheduled for surgery  on 5/9 -Advised the patient that he needs to present to the emergency department if his hernia becomes hard, painful, nonreducible, and he develops nausea, vomiting, and obstipation. -Information provided to the patient regarding inguinal hernias   All questions were answered to the satisfaction of the patient and family.   Theophilus Kinds, DO [facility] [address] [place], [place] [address] [phone] (office)

## 2023-03-22 NOTE — Anesthesia Postprocedure Evaluation (Signed)
Anesthesia Post Note  Patient: Jose Chase  Procedure(s) Performed: HERNIA REPAIR INGUINAL ADULT W/ MESH (Right: Groin)  Patient location during evaluation: Phase II Anesthesia Type: General Level of consciousness: awake Pain management: pain level controlled Vital Signs Assessment: post-procedure vital signs reviewed and stable Respiratory status: spontaneous breathing and respiratory function stable Cardiovascular status: blood pressure returned to baseline and stable Postop Assessment: no headache and no apparent nausea or vomiting Anesthetic complications: no Comments: Late entry   No notable events documented.   Last Vitals:  Vitals:   03/21/23 1000 03/21/23 1020  BP: (!) 172/80 (!) 169/76  Pulse: (!) 59 (!) 59  Resp: 14 16  Temp:  (!) 36.4 C  SpO2: 100% 100%    Last Pain:  Vitals:   03/21/23 1020  TempSrc: Oral  PainSc: 0-No pain                 Windell Norfolk

## 2023-03-27 ENCOUNTER — Encounter (HOSPITAL_COMMUNITY): Payer: Self-pay | Admitting: Surgery

## 2023-03-27 NOTE — Progress Notes (Signed)
Remote pacemaker transmission.   

## 2023-04-03 ENCOUNTER — Encounter: Payer: Self-pay | Admitting: Surgery

## 2023-04-03 ENCOUNTER — Encounter: Payer: Medicare Other | Admitting: Surgery

## 2023-04-03 ENCOUNTER — Ambulatory Visit (INDEPENDENT_AMBULATORY_CARE_PROVIDER_SITE_OTHER): Payer: Medicare Other | Admitting: Surgery

## 2023-04-03 VITALS — BP 101/57 | HR 59 | Temp 97.9°F | Resp 14 | Ht 72.0 in | Wt 148.0 lb

## 2023-04-03 DIAGNOSIS — Z09 Encounter for follow-up examination after completed treatment for conditions other than malignant neoplasm: Secondary | ICD-10-CM

## 2023-04-03 NOTE — Progress Notes (Signed)
[facility] Note   HPI:  89 y.o. Male presents to clinic for post-op follow-up status post open right inguinal hernia repair with mesh on 5/9.  Patient states that he has been doing well since surgery.  He initially had some swelling to his surgical site but this has since improved.  He denies any significant pain at his incision site.  He is still having some issues with swallowing, but this was a problem prior to surgery secondary to a Schatzki ring.  He denies any issues with his incision site.  Denies fevers and chills.  Review of Systems:  All other review of systems: otherwise negative   Vital Signs:  BP (!) 101/57   Pulse (!) 59   Temp 97.9 F (36.6 C) (Oral)   Resp 14   Ht 6' (1.829 m)   Wt 148 lb (67.1 kg)   SpO2 96%   BMI 20.07 kg/m    Physical Exam:  Physical Exam Vitals reviewed.  Constitutional:      Appearance: Normal appearance.  Abdominal:     Comments: Abdomen soft, nondistended, no percussion tenderness, nontender to palpation; no rigidity, guarding, rebound tenderness; Right inguinal incision healing well with skin glue in place, mild edema, nontender to palpation  Neurological:     Mental Status: He is alert.     Laboratory studies: None  Imaging:  None  Assessment:  89 y.o. yo Male who presents for follow-up status post open right inguinal hernia repair with mesh on 5/9  Plan:  -Patient overall doing very well.  He is tolerating a diet, moving his bowels, and pain is adequately controlled -Advised that he can put antibiotic ointment on the incision site prior to showering to help loosen remaining skin glue -We have reached out to Dr. Luvenia Starch office about scheduling follow-up for the patient so that he can potentially be scheduled for EGD with dilation -Follow up as needed  All of the above recommendations were discussed with the patient and patient's family, and all of patient's and family's questions were answered to their  expressed satisfaction.  Theophilus Kinds, DO [facility] [address] [place], [place] [address] [phone] (office)

## 2023-04-13 NOTE — Progress Notes (Unsigned)
GI Office Note    Referring Provider: Benetta Spar* Primary Care Physician:  Benetta Spar, MD  Primary Gastroenterologist:Charles K. Marletta Lor, DO   Chief Complaint   No chief complaint on file.   History of Present Illness   Jose Chase is a 89 y.o. male presenting today for follow up. Last seen 09/2022 for dysphagia. EGD has been postponed due to cardiac issues. Ultimately underwent pacemaker placement. 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   He had to have lead revision for dislodged RV lead 09/2022. Last seen by cardiology 12/2022. Had inguinal hernia repair 03/21/23.       Medications   Current Outpatient Medications  Medication Sig Dispense Refill   acetaminophen (TYLENOL) 500 MG tablet Take 2 tablets (1,000 mg total) by mouth every 6 (six) hours. 30 tablet 0   bismuth subsalicylate (PEPTO BISMOL) 262 MG/15ML suspension Take 30 mLs by mouth every 6 (six) hours as needed for diarrhea or loose stools.     docusate sodium (COLACE) 100 MG capsule Take 1 capsule (100 mg total) by mouth 2 (two) times daily. 60 capsule 2   gabapentin (NEURONTIN) 100 MG capsule Take 100 mg by mouth at bedtime.     glipiZIDE (GLUCOTROL) 10 MG tablet Take 10 mg by mouth daily before breakfast.     loratadine (CLARITIN) 10 MG tablet Take 10 mg by mouth daily.     metFORMIN (GLUCOPHAGE) 850 MG tablet Take 1 tablet (850 mg total) by mouth 2 (two) times daily. 30 tablet 11   Multiple Vitamins-Minerals (MEGA MULTI MEN PO) Take 1 tablet by mouth 3 (three) times a week.     omeprazole (PRILOSEC) 40 MG capsule Take 1 capsule (40 mg total) by mouth daily. 90 capsule 3   oxyCODONE (ROXICODONE) 5 MG immediate release tablet Take 1 tablet (5 mg total) by mouth every 6 (six) hours as needed. (Patient not taking: Reported on 04/03/2023) 16 tablet 0   simvastatin (ZOCOR) 20 MG tablet Take 20 mg by mouth at bedtime.       No current facility-administered  medications for this visit.    Allergies   Allergies as of 04/15/2023   (No Known Allergies)     Past Medical History   Past Medical History:  Diagnosis Date   Aspirin long-term use    DM (diabetes mellitus) (HCC)    Dyspnea    Dysrhythmia    GERD (gastroesophageal reflux disease)    Hemorrhoids, internal 10/13/2007   Hiatal hernia DEC 2008 LARGE   Presence of permanent cardiac pacemaker    Prostate cancer [facility])     Past Surgical History   Past Surgical History:  Procedure Laterality Date   BACK SURGERY     COLONOSCOPY  DEC 2008   COMPLICATED BY CONTAINED RECTAL PERFORATION   ESOPHAGOGASTRODUODENOSCOPY  05/29/2011   Procedure: ESOPHAGOGASTRODUODENOSCOPY (EGD);  Surgeon: Arlyce Harman, MD;  [facility];  Service: Endoscopy;  Laterality: N/A;  Possible ED   ESOPHAGOGASTRODUODENOSCOPY N/A 02/07/2016   Procedure: ESOPHAGOGASTRODUODENOSCOPY (EGD);  Surgeon: West Bali, MD;  [facility];  Service: Endoscopy;  Laterality: N/A;  200 - moved to 3/28 @ 10:30 - office notified pt   ESOPHAGOGASTRODUODENOSCOPY (EGD) WITH ESOPHAGEAL DILATION N/A 10/20/2013   WUJ:[id number] at the gastroesophageal junction/ Medium sized hiatal hernia/MILD Erosive gastritis. Biopsy showed chronic inflammation, no H. pylori, fragment of fundic gland polyp   INGUINAL HERNIA REPAIR Right 03/21/2023   Procedure:  HERNIA REPAIR INGUINAL ADULT W/ MESH;  Surgeon: Lewie Chamber, DO;  Location: [facility];  Service: General;  Laterality: Right;   LEAD REVISION/REPAIR N/A 10/03/2022   Procedure: LEAD REVISION/REPAIR;  Surgeon: Lanier Prude, MD;  [facility]: [facility] INVAS[facility] CV LAB;  Service: Cardiovascular;  Laterality: N/A;   MALONEY DILATION  05/29/2011   Procedure: Elease Hashimoto DILATION;  Surgeon: Arlyce Harman, MD;  [facility]: [facility];  Service: Endoscopy;  Laterality: N/A;   PACEMAKER IMPLANT N/A 08/13/2022   Procedure: PACEMAKER IMPLANT;  Surgeon: Lanier Prude, MD;  Location:  [facility] INVASIVE CV LAB;  Service: Cardiovascular;  Laterality: N/A;   PROSTATE SURGERY     prostate cancer   SAVORY DILATION  05/29/2011   Procedure: SAVORY DILATION;  Surgeon: Arlyce Harman, MD;  [facility];  Service: Endoscopy;  Laterality: N/A;   SAVORY DILATION N/A 02/07/2016   Procedure: SAVORY DILATION;  Surgeon: West Bali, MD;  [facility];  Service: Endoscopy;  Laterality: N/A;   UPPER GASTROINTESTINAL ENDOSCOPY  DEC 2008   ZO:XWRUEA GASTRIC MUCOSA    Past Family History   Family History  Problem Relation Age of Onset   Heart disease Father    Heart disease Brother    Cancer Brother    Cancer Brother    Cancer Other    Cancer Sister    Colon cancer Neg Hx    Colon polyps Neg Hx     Past Social History   Social History   Socioeconomic History   Marital status: Married    Spouse name: Not on file   Number of children: Not on file   Years of education: Not on file   Highest education level: Not on file  Occupational History   Not on file  Tobacco Use   Smoking status: Former    Types: Cigarettes    Quit date: 01/11/1973    Years since quitting: 50.2   Smokeless tobacco: Never   Tobacco comments:    Never smoked very much. Occasional smoker-once or twice a week. Quit in 1974.  Vaping Use   Vaping Use: Never used  Substance and Sexual Activity   Alcohol use: No    Alcohol/week: 0.0 standard drinks of alcohol   Drug use: No   Sexual activity: Not Currently  Other Topics Concern   Not on file  Social History Narrative   Not on file   Social Determinants of Health   Financial Resource Strain: Not on file  Food Insecurity: Not on file  Transportation Needs: Not on file  Physical Activity: Not on file  Stress: Not on file  Social Connections: Not on file  Intimate Partner Violence: Not on file    Review of Systems   General: Negative for anorexia, weight loss, fever, chills, fatigue, weakness. ENT: Negative for hoarseness,  difficulty swallowing , nasal congestion. CV: Negative for chest pain, angina, palpitations, dyspnea on exertion, peripheral edema.  Respiratory: Negative for dyspnea at rest, dyspnea on exertion, cough, sputum, wheezing.  GI: See history of present illness. GU:  Negative for dysuria, hematuria, urinary incontinence, urinary frequency, nocturnal urination.  Endo: Negative for unusual weight change.     Physical Exam   There were no vitals taken for this visit.   General: Well-nourished, well-developed in no acute distress.  Eyes: No icterus. Mouth: Oropharyngeal mucosa moist and pink , no lesions erythema or exudate. Lungs: Clear to auscultation bilaterally.  Heart: Regular rate and rhythm, no murmurs  rubs or gallops.  Abdomen: Bowel sounds are normal, nontender, nondistended, no hepatosplenomegaly or masses,  no abdominal bruits or hernia , no rebound or guarding.  Rectal: ***  Extremities: No lower extremity edema. No clubbing or deformities. Neuro: Alert and oriented x 4   Skin: Warm and dry, no jaundice.   Psych: Alert and cooperative, normal mood and affect.  Labs   *** Imaging Studies   No results found.  Assessment       PLAN   ***   Leanna Battles. Melvyn Neth, MHS, PA-C [facility]

## 2023-04-13 NOTE — H&P (View-Only) (Signed)
   GI Office Note    Referring Provider: Fanta, Tesfaye Demissie* Primary Care Physician:  Fanta, Tesfaye Demissie, MD  Primary Gastroenterologist:Charles K. Carver, DO   Chief Complaint   No chief complaint on file.   History of Present Illness   Jose Chase is a 89 y.o. male presenting today for follow up. Last seen 09/2022 for dysphagia. EGD has been postponed due to cardiac issues. Ultimately underwent pacemaker placement. 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   He had to have lead revision for dislodged RV lead 09/2022. Last seen by cardiology 12/2022. Had inguinal hernia repair 03/21/23.       Medications   Current Outpatient Medications  Medication Sig Dispense Refill   acetaminophen (TYLENOL) 500 MG tablet Take 2 tablets (1,000 mg total) by mouth every 6 (six) hours. 30 tablet 0   bismuth subsalicylate (PEPTO BISMOL) 262 MG/15ML suspension Take 30 mLs by mouth every 6 (six) hours as needed for diarrhea or loose stools.     docusate sodium (COLACE) 100 MG capsule Take 1 capsule (100 mg total) by mouth 2 (two) times daily. 60 capsule 2   gabapentin (NEURONTIN) 100 MG capsule Take 100 mg by mouth at bedtime.     glipiZIDE (GLUCOTROL) 10 MG tablet Take 10 mg by mouth daily before breakfast.     loratadine (CLARITIN) 10 MG tablet Take 10 mg by mouth daily.     metFORMIN (GLUCOPHAGE) 850 MG tablet Take 1 tablet (850 mg total) by mouth 2 (two) times daily. 30 tablet 11   Multiple Vitamins-Minerals (MEGA MULTI MEN PO) Take 1 tablet by mouth 3 (three) times a week.     omeprazole (PRILOSEC) 40 MG capsule Take 1 capsule (40 mg total) by mouth daily. 90 capsule 3   oxyCODONE (ROXICODONE) 5 MG immediate release tablet Take 1 tablet (5 mg total) by mouth every 6 (six) hours as needed. (Patient not taking: Reported on 04/03/2023) 16 tablet 0   simvastatin (ZOCOR) 20 MG tablet Take 20 mg by mouth at bedtime.       No current facility-administered  medications for this visit.    Allergies   Allergies as of 04/15/2023   (No Known Allergies)     Past Medical History   Past Medical History:  Diagnosis Date   Aspirin long-term use    DM (diabetes mellitus) (HCC)    Dyspnea    Dysrhythmia    GERD (gastroesophageal reflux disease)    Hemorrhoids, internal 10/13/2007   Hiatal hernia DEC 2008 LARGE   Presence of permanent cardiac pacemaker    Prostate cancer (HCC)     Past Surgical History   Past Surgical History:  Procedure Laterality Date   BACK SURGERY     COLONOSCOPY  DEC 2008   COMPLICATED BY CONTAINED RECTAL PERFORATION   ESOPHAGOGASTRODUODENOSCOPY  05/29/2011   Procedure: ESOPHAGOGASTRODUODENOSCOPY (EGD);  Surgeon: Sandi M Fields, MD;  [facility];  Service: Endoscopy;  Laterality: N/A;  Possible ED   ESOPHAGOGASTRODUODENOSCOPY N/A 02/07/2016   Procedure: ESOPHAGOGASTRODUODENOSCOPY (EGD);  Surgeon: Sandi L Fields, MD;  [facility];  Service: Endoscopy;  Laterality: N/A;  200 - moved to 3/28 @ 10:30 - office notified pt   ESOPHAGOGASTRODUODENOSCOPY (EGD) WITH ESOPHAGEAL DILATION N/A 10/20/2013   SLF:Stricture at the gastroesophageal junction/ Medium sized hiatal hernia/MILD Erosive gastritis. Biopsy showed chronic inflammation, no H. pylori, fragment of fundic gland polyp   INGUINAL HERNIA REPAIR Right 03/21/2023   Procedure:   HERNIA REPAIR INGUINAL ADULT W/ MESH;  Surgeon: Pappayliou, Catherine A, DO;  Location: [facility];  Service: General;  Laterality: Right;   LEAD REVISION/REPAIR N/A 10/03/2022   Procedure: LEAD REVISION/REPAIR;  Surgeon: Lambert, Cameron T, MD;  [facility] IN[facility];  Service: Cardiovascular;  Laterality: N/A;   MALONEY DILATION  05/29/2011   Procedure: MALONEY DILATION;  Surgeon: Sandi M Fields, MD;  [facility];  Service: Endoscopy;  Laterality: N/A;   PACEMAKER IMPLANT N/A 08/13/2022   Procedure: PACEMAKER IMPLANT;  Surgeon: Lambert, Cameron T, MD;  Location:  [facility];  Service: Cardiovascular;  Laterality: N/A;   PROSTATE SURGERY     prostate cancer   SAVORY DILATION  05/29/2011   Procedure: SAVORY DILATION;  Surgeon: Sandi M Fields, MD;  [facility];  Service: Endoscopy;  Laterality: N/A;   SAVORY DILATION N/A 02/07/2016   Procedure: SAVORY DILATION;  Surgeon: Sandi L Fields, MD;  [facility];  Service: Endoscopy;  Laterality: N/A;   UPPER GASTROINTESTINAL ENDOSCOPY  DEC 2008   Bx:BENIGN GASTRIC MUCOSA    Past Family History   Family History  Problem Relation Age of Onset   Heart disease Father    Heart disease Brother    Cancer Brother    Cancer Brother    Cancer Other    Cancer Sister    Colon cancer Neg Hx    Colon polyps Neg Hx     Past Social History   Social History   Socioeconomic History   Marital status: Married    Spouse name: Not on file   Number of children: Not on file   Years of education: Not on file   Highest education level: Not on file  Occupational History   Not on file  Tobacco Use   Smoking status: Former    Types: Cigarettes    Quit date: 01/11/1973    Years since quitting: 50.2   Smokeless tobacco: Never   Tobacco comments:    Never smoked very much. Occasional smoker-once or twice a week. Quit in 1974.  Vaping Use   Vaping Use: Never used  Substance and Sexual Activity   Alcohol use: No    Alcohol/week: 0.0 standard drinks of alcohol   Drug use: No   Sexual activity: Not Currently  Other Topics Concern   Not on file  Social History Narrative   Not on file   Social Determinants of Health   Financial Resource Strain: Not on file  Food Insecurity: Not on file  Transportation Needs: Not on file  Physical Activity: Not on file  Stress: Not on file  Social Connections: Not on file  Intimate Partner Violence: Not on file    Review of Systems   General: Negative for anorexia, weight loss, fever, chills, fatigue, weakness. ENT: Negative for hoarseness,  difficulty swallowing , nasal congestion. CV: Negative for chest pain, angina, palpitations, dyspnea on exertion, peripheral edema.  Respiratory: Negative for dyspnea at rest, dyspnea on exertion, cough, sputum, wheezing.  GI: See history of present illness. GU:  Negative for dysuria, hematuria, urinary incontinence, urinary frequency, nocturnal urination.  Endo: Negative for unusual weight change.     Physical Exam   There were no vitals taken for this visit.   General: Well-nourished, well-developed in no acute distress.  Eyes: No icterus. Mouth: Oropharyngeal mucosa moist and pink , no lesions erythema or exudate. Lungs: Clear to auscultation bilaterally.  Heart: Regular rate and rhythm, no murmurs   rubs or gallops.  Abdomen: Bowel sounds are normal, nontender, nondistended, no hepatosplenomegaly or masses,  no abdominal bruits or hernia , no rebound or guarding.  Rectal: ***  Extremities: No lower extremity edema. No clubbing or deformities. Neuro: Alert and oriented x 4   Skin: Warm and dry, no jaundice.   Psych: Alert and cooperative, normal mood and affect.  Labs   *** Imaging Studies   No results found.  Assessment       PLAN   ***   Chandlar Guice S. Maytal Mijangos, MHS, PA-C [facility]  

## 2023-04-15 ENCOUNTER — Encounter: Payer: Self-pay | Admitting: *Deleted

## 2023-04-15 ENCOUNTER — Telehealth: Payer: Self-pay | Admitting: *Deleted

## 2023-04-15 ENCOUNTER — Ambulatory Visit (INDEPENDENT_AMBULATORY_CARE_PROVIDER_SITE_OTHER): Payer: Medicare Other | Admitting: Gastroenterology

## 2023-04-15 ENCOUNTER — Encounter: Payer: Self-pay | Admitting: Gastroenterology

## 2023-04-15 VITALS — BP 108/64 | HR 60 | Temp 97.4°F | Ht 72.0 in | Wt 150.0 lb

## 2023-04-15 DIAGNOSIS — K222 Esophageal obstruction: Secondary | ICD-10-CM | POA: Diagnosis not present

## 2023-04-15 DIAGNOSIS — D649 Anemia, unspecified: Secondary | ICD-10-CM | POA: Diagnosis not present

## 2023-04-15 DIAGNOSIS — R1319 Other dysphagia: Secondary | ICD-10-CM | POA: Diagnosis not present

## 2023-04-15 NOTE — Patient Instructions (Signed)
Upper endoscopy to be scheduled.  Continue omeprazole 40mg  daily.

## 2023-04-15 NOTE — Telephone Encounter (Signed)
Advised pt's wife (on [place]) of pre-op appointment date and time. (04/26/23 at 12:45 pm)

## 2023-04-25 NOTE — Patient Instructions (Signed)
Jose Chase  04/25/2023     @PREFPERIOPPHARMACY @   Your procedure is scheduled on 04/29/2023.  Report to [facility] at 11:45 A.M.  Call this number if you have problems the morning of surgery:  [phone]  If you experience any cold or flu symptoms such as cough, fever, chills, shortness of breath, etc. between now and your scheduled surgery, please notify us at the above number.   Remember:   Please follow the diet instructions given to you by Dr Queen [facility] office.      Take these medicines the morning of surgery with A SIP OF WATER : Neurontin Claritin Omeprazole and Flomax    Do not wear jewelry, make-up or nail polish, including gel polish,  artificial nails, or any other type of covering on natural nails (fingers and  toes).  Do not wear lotions, powders, or perfumes, or deodorant.  Do not shave 48 hours prior to surgery.  Men may shave face and neck.  Do not bring valuables to the hospital.  [facility] is not responsible for any belongings or valuables.  Contacts, dentures or bridgework may not be worn into surgery.  Leave your suitcase in the car.  After surgery it may be brought to your room.  For patients admitted to the hospital, discharge time will be determined by your treatment team.  Patients discharged the day of surgery will not be allowed to drive home.   Name and phone number of your driver:   Family Special instructions:  N/A  Please read over the following fact sheets that you were given. Care and Recovery After Surgery  Upper Endoscopy, Adult Upper endoscopy is a procedure to look inside the upper GI (gastrointestinal) tract. The upper GI tract is made up of: The esophagus. This is the part of the body that moves food from your mouth to your stomach. The stomach. The duodenum. This is the first part of your small intestine. This procedure is also called esophagogastroduodenoscopy (EGD) or gastroscopy. In this procedure, your health care provider  passes a thin, flexible tube (endoscope) through your mouth and down your esophagus into your stomach and into your duodenum. A small camera is attached to the end of the tube. Images from the camera appear on a monitor in the exam room. During this procedure, your health care provider may also remove a small piece of tissue to be sent to a lab and examined under a microscope (biopsy). Your health care provider may do an upper endoscopy to diagnose cancers of the upper GI tract. You may also have this procedure to find the cause of other conditions, such as: Stomach pain. Heartburn. Pain or problems when swallowing. Nausea and vomiting. Stomach bleeding. Stomach ulcers. Tell a health care provider about: Any allergies you have. All medicines you are taking, including vitamins, herbs, eye drops, creams, and over-the-counter medicines. Any problems you or family members have had with anesthetic medicines. Any bleeding problems you have. Any surgeries you have had. Any medical conditions you have. Whether you are pregnant or may be pregnant. What are the risks? Your healthcare provider will talk with you about risks. These may include: Infection. Bleeding. Allergic reactions to medicines. A tear or hole (perforation) in the esophagus, stomach, or duodenum. What happens before the procedure? When to stop eating and drinking Follow instructions from your health care provider about what you may eat and drink. These may include: 8 hours before your procedure Stop eating most foods. Do not eat  meat, fried foods, or fatty foods. Eat only light foods, such as toast or crackers. All liquids are okay except energy drinks and alcohol. 6 hours before your procedure Stop eating. Drink only clear liquids, such as water, clear fruit juice, black coffee, plain tea, and sports drinks. Do not drink energy drinks or alcohol. 2 hours before your procedure Stop drinking all liquids. You may be allowed to  take medicines with small sips of water. If you do not follow your health care provider's instructions, your procedure may be delayed or canceled. Medicines Ask your health care provider about: Changing or stopping your regular medicines. This is especially important if you are taking diabetes medicines or blood thinners. Taking medicines such as aspirin and ibuprofen. These medicines can thin your blood. Do not take these medicines unless your health care provider tells you to take them. Taking over-the-counter medicines, vitamins, herbs, and supplements. General instructions If you will be going home right after the procedure, plan to have a responsible adult: Take you home from the hospital or clinic. You will not be allowed to drive. Care for you for the time you are told. What happens during the procedure?  An IV will be inserted into one of your veins. You may be given one or more of the following: A medicine to help you relax (sedative). A medicine to numb the throat (local anesthetic). You will lie on your left side on an exam table. Your health care provider will pass the endoscope through your mouth and down your esophagus. Your health care provider will use the scope to check the inside of your esophagus, stomach, and duodenum. Biopsies may be taken. The endoscope will be removed. The procedure may vary among health care providers and hospitals. What happens after the procedure? Your blood pressure, heart rate, breathing rate, and blood oxygen level will be monitored until you leave the hospital or clinic. When your throat is no longer numb, you may be given some fluids to drink. If you were given a sedative during the procedure, it can affect you for several hours. Do not drive or operate machinery until your health care provider says that it is safe. It is up to you to get the results of your procedure. Ask your health care provider, or the department that is doing the  procedure, when your results will be ready. Contact a health care provider if you: Have a sore throat that lasts longer than 1 day. Have a fever. Get help right away if you: Vomit blood or your vomit looks like coffee grounds. Have bloody, black, or tarry stools. Have a very bad sore throat or you cannot swallow. Have difficulty breathing or very bad pain in your chest or abdomen. These symptoms may be an emergency. Get help right away. Call 911. Do not wait to see if the symptoms will go away. Do not drive yourself to the hospital. Summary Upper endoscopy is a procedure to look inside the upper GI tract. During the procedure, an IV will be inserted into one of your veins. You may be given a medicine to help you relax. The endoscope will be passed through your mouth and down your esophagus. Follow instructions from your health care provider about what you can eat and drink. This information is not intended to replace advice given to you by your health care provider. Make sure you discuss any questions you have with your health care provider. Document Revised: 02/07/2022 Document Reviewed: 02/07/2022 Elsevier Patient Education  2024 [employer]  Monitored Anesthesia Care Anesthesia refers to the techniques, procedures, and medicines that help a person stay safe and comfortable during surgery. Monitored anesthesia care, or sedation, is one type of anesthesia. You may have sedation if you do not need to be asleep for your procedure. Procedures that use sedation may include: Surgery to remove cataracts from your eyes. A dental procedure. A biopsy. This is when a tissue sample is removed and looked at under a microscope. You will be watched closely during your procedure. Your level of sedation or type of anesthesia may be changed to fit your needs. Tell a health care provider about: Any allergies you have. All medicines you are taking, including vitamins, herbs, eye drops, creams, and  over-the-counter medicines. Any problems you or family members have had with anesthesia. Any bleeding problems you have. Any surgeries you have had. Any medical conditions or illnesses you have. This includes sleep apnea, cough, fever, or the flu. Whether you are pregnant or may be pregnant. Whether you use cigarettes, alcohol, or drugs. Any use of steroids, whether by mouth or as a cream. What are the risks? Your health care provider will talk with you about risks. These may include: Getting too much medicine (oversedation). Nausea. Allergic reactions to medicines. Trouble breathing. If this happens, a breathing tube may be used to help you breathe. It will be removed when you are awake and breathing on your own. Heart trouble. Lung trouble. Confusion that gets better with time (emergence delirium). What happens before the procedure? When to stop eating and drinking Follow instructions from your health care provider about what you may eat and drink. These may include: 8 hours before your procedure Stop eating most foods. Do not eat meat, fried foods, or fatty foods. Eat only light foods, such as toast or crackers. All liquids are okay except energy drinks and alcohol. 6 hours before your procedure Stop eating. Drink only clear liquids, such as water, clear fruit juice, black coffee, plain tea, and sports drinks. Do not drink energy drinks or alcohol. 2 hours before your procedure Stop drinking all liquids. You may be allowed to take medicines with small sips of water. If you do not follow your health care provider's instructions, your procedure may be delayed or canceled. Medicines Ask your health care provider about: Changing or stopping your regular medicines. These include any diabetes medicines or blood thinners you take. Taking medicines such as aspirin and ibuprofen. These medicines can thin your blood. Do not take them unless your health care provider tells you to. Taking  over-the-counter medicines, vitamins, herbs, and supplements. Testing You may have an exam or testing. You may have a blood or urine sample taken. General instructions Do not use any products that contain nicotine or tobacco for at least 4 weeks before the procedure. These products include cigarettes, chewing tobacco, and vaping devices, such as e-cigarettes. If you need help quitting, ask your health care provider. If you will be going home right after the procedure, plan to have a responsible adult: Take you home from the hospital or clinic. You will not be allowed to drive. Care for you for the time you are told. What happens during the procedure?  Your blood pressure, heart rate, breathing, level of pain, and blood oxygen level will be monitored. An IV will be inserted into one of your veins. You may be given: A sedative. This helps you relax. Anesthesia. This will: Numb certain areas of your body. Make you  fall asleep for surgery. You will be given medicines as needed to keep you comfortable. The more medicine you are given, the deeper your level of sedation will be. Your level of sedation may be changed to fit your needs. There are three levels of sedation: Mild sedation. At this level, you may feel awake and relaxed. You will be able to follow directions. Moderate sedation. At this level, you will be sleepy. You may not remember the procedure. Deep sedation. At this level, you will be asleep. You will not remember the procedure. How you get the medicines will depend on your age and the procedure. They may be given as: A pill. This may be taken by mouth (orally) or inserted into the rectum. An injection. This may be into a vein or muscle. A spray through the nose. After your procedure is over, the medicine will be stopped. The procedure may vary among health care providers and hospitals. What happens after the procedure? Your blood pressure, heart rate, breathing rate, and blood  oxygen level will be monitored until you leave the hospital or clinic. You may feel sleepy, clumsy, or nauseous. You may not remember what happened during or after the procedure. Sedation can affect you for several hours. Do not drive or use machinery until your health care provider says that it is safe. This information is not intended to replace advice given to you by your health care provider. Make sure you discuss any questions you have with your health care provider. Document Revised: 03/26/2022 Document Reviewed: 03/26/2022 Elsevier Patient Education  2024 [employer].

## 2023-04-26 ENCOUNTER — Encounter (HOSPITAL_COMMUNITY)
Admission: RE | Admit: 2023-04-26 | Discharge: 2023-04-26 | Disposition: A | Discharge status: Home / Self Care | Payer: Medicare Other | Source: Ambulatory Visit | Attending: Internal Medicine | Admitting: Internal Medicine

## 2023-04-26 VITALS — BP 89/53 | HR 59 | Temp 98.3°F | Ht 72.0 in | Wt 145.0 lb

## 2023-04-26 DIAGNOSIS — E119 Type 2 diabetes mellitus without complications: Secondary | ICD-10-CM | POA: Insufficient documentation

## 2023-04-26 DIAGNOSIS — R9431 Abnormal electrocardiogram [ECG] [EKG]: Secondary | ICD-10-CM | POA: Insufficient documentation

## 2023-04-26 DIAGNOSIS — Z950 Presence of cardiac pacemaker: Secondary | ICD-10-CM | POA: Diagnosis not present

## 2023-04-26 DIAGNOSIS — Z01818 Encounter for other preprocedural examination: Secondary | ICD-10-CM | POA: Insufficient documentation

## 2023-04-26 LAB — BASIC METABOLIC PANEL
Anion gap: 8 (ref 5–15)
BUN: 13 mg/dL (ref 8–23)
CO2: 23 mmol/L (ref 22–32)
Calcium: 8.9 mg/dL (ref 8.9–10.3)
Chloride: 108 mmol/L (ref 98–111)
Creatinine, Ser: 1.15 mg/dL (ref 0.61–1.24)
GFR, Estimated: >60 mL/min (ref >60)
Glucose, Bld: 249 mg/dL — ABNORMAL HIGH (ref 70–99)
Potassium: 4 mmol/L (ref 3.5–5.1)
Sodium: 139 mmol/L (ref 135–145)

## 2023-04-29 ENCOUNTER — Encounter (HOSPITAL_COMMUNITY): Payer: Self-pay

## 2023-04-29 ENCOUNTER — Ambulatory Visit (HOSPITAL_BASED_OUTPATIENT_CLINIC_OR_DEPARTMENT_OTHER): Payer: Medicare Other | Admitting: Anesthesiology

## 2023-04-29 ENCOUNTER — Other Ambulatory Visit: Payer: Self-pay

## 2023-04-29 ENCOUNTER — Encounter (HOSPITAL_COMMUNITY)
Admission: RE | Disposition: A | Discharge status: Home / Self Care | Payer: Self-pay | Source: Ambulatory Visit | Attending: Internal Medicine

## 2023-04-29 ENCOUNTER — Ambulatory Visit (HOSPITAL_COMMUNITY)
Admission: RE | Admit: 2023-04-29 | Discharge: 2023-04-29 | Disposition: A | Discharge status: Home / Self Care | Payer: Medicare Other | Source: Ambulatory Visit | Attending: Internal Medicine | Admitting: Internal Medicine

## 2023-04-29 ENCOUNTER — Ambulatory Visit (HOSPITAL_COMMUNITY): Payer: Medicare Other | Admitting: Anesthesiology

## 2023-04-29 DIAGNOSIS — K222 Esophageal obstruction: Secondary | ICD-10-CM

## 2023-04-29 DIAGNOSIS — K449 Diaphragmatic hernia without obstruction or gangrene: Secondary | ICD-10-CM | POA: Diagnosis not present

## 2023-04-29 DIAGNOSIS — R0602 Shortness of breath: Secondary | ICD-10-CM | POA: Insufficient documentation

## 2023-04-29 DIAGNOSIS — Q399 Congenital malformation of esophagus, unspecified: Secondary | ICD-10-CM | POA: Diagnosis not present

## 2023-04-29 DIAGNOSIS — E119 Type 2 diabetes mellitus without complications: Secondary | ICD-10-CM | POA: Diagnosis not present

## 2023-04-29 DIAGNOSIS — K3189 Other diseases of stomach and duodenum: Secondary | ICD-10-CM

## 2023-04-29 DIAGNOSIS — K317 Polyp of stomach and duodenum: Secondary | ICD-10-CM | POA: Diagnosis not present

## 2023-04-29 DIAGNOSIS — R1310 Dysphagia, unspecified: Secondary | ICD-10-CM | POA: Diagnosis not present

## 2023-04-29 DIAGNOSIS — K219 Gastro-esophageal reflux disease without esophagitis: Secondary | ICD-10-CM | POA: Insufficient documentation

## 2023-04-29 DIAGNOSIS — Z7984 Long term (current) use of oral hypoglycemic drugs: Secondary | ICD-10-CM | POA: Diagnosis not present

## 2023-04-29 DIAGNOSIS — I38 Endocarditis, valve unspecified: Secondary | ICD-10-CM | POA: Insufficient documentation

## 2023-04-29 DIAGNOSIS — D649 Anemia, unspecified: Secondary | ICD-10-CM | POA: Diagnosis not present

## 2023-04-29 DIAGNOSIS — Z87891 Personal history of nicotine dependence: Secondary | ICD-10-CM | POA: Insufficient documentation

## 2023-04-29 DIAGNOSIS — I358 Other nonrheumatic aortic valve disorders: Secondary | ICD-10-CM | POA: Diagnosis not present

## 2023-04-29 DIAGNOSIS — Z950 Presence of cardiac pacemaker: Secondary | ICD-10-CM | POA: Diagnosis not present

## 2023-04-29 HISTORY — PX: ESOPHAGOGASTRODUODENOSCOPY (EGD) WITH PROPOFOL: SHX5813

## 2023-04-29 HISTORY — PX: BIOPSY: SHX5522

## 2023-04-29 HISTORY — PX: BALLOON DILATION: SHX5330

## 2023-04-29 LAB — GLUCOSE, CAPILLARY: Glucose-Capillary: 98 mg/dL (ref 70–99)

## 2023-04-29 SURGERY — ESOPHAGOGASTRODUODENOSCOPY (EGD) WITH PROPOFOL
Anesthesia: General

## 2023-04-29 MED ORDER — LACTATED RINGERS IV SOLN: INTRAVENOUS | Status: DC

## 2023-04-29 MED ORDER — LIDOCAINE HCL (CARDIAC) PF 100 MG/5ML IV SOSY
PREFILLED_SYRINGE | INTRAVENOUS | Status: DC | PRN
  Administered 2023-04-29: 50 mg via INTRAVENOUS

## 2023-04-29 MED ORDER — PROPOFOL 10 MG/ML IV BOLUS
INTRAVENOUS | Status: DC | PRN
  Administered 2023-04-29: 30 mg via INTRAVENOUS
  Administered 2023-04-29: 40 mg via INTRAVENOUS
  Administered 2023-04-29: 70 mg via INTRAVENOUS
  Administered 2023-04-29: 40 mg via INTRAVENOUS

## 2023-04-29 NOTE — Anesthesia Postprocedure Evaluation (Signed)
Anesthesia Post Note  Patient: Jose Chase  Procedure(s) Performed: ESOPHAGOGASTRODUODENOSCOPY (EGD) WITH PROPOFOL BALLOON DILATION BIOPSY  Patient location during evaluation: Phase II Anesthesia Type: General Level of consciousness: awake and alert and oriented Pain management: pain level controlled Vital Signs Assessment: post-procedure vital signs reviewed and stable Respiratory status: spontaneous breathing, nonlabored ventilation and respiratory function stable Cardiovascular status: blood pressure returned to baseline and stable Postop Assessment: no apparent nausea or vomiting Anesthetic complications: no  No notable events documented.   Last Vitals:  Vitals:   04/29/23 1219 04/29/23 1349  BP: (!) 167/81 (!) 121/58  Pulse: 63 68  Resp: 19 (!) 21  Temp: 36.4 C 36.4 C  SpO2: 100% 100%    Last Pain:  Vitals:   04/29/23 1349  TempSrc: Axillary  PainSc: 0-No pain                 [name] C Michal Strzelecki

## 2023-04-29 NOTE — Anesthesia Procedure Notes (Signed)
Date/Time: 04/29/2023 1:35 PM  Performed by: Julian Reil, CRNAPre-anesthesia Checklist: Patient identified, Emergency Drugs available, Suction available and Patient being monitored Patient Re-evaluated:Patient Re-evaluated prior to induction Oxygen Delivery Method: Nasal cannula Induction Type: IV induction Placement Confirmation: positive ETCO2

## 2023-04-29 NOTE — Discharge Instructions (Addendum)
EGD Discharge instructions Please read the instructions outlined below and refer to this sheet in the next few weeks. These discharge instructions provide you with general information on caring for yourself after you leave the hospital. Your doctor may also give you specific instructions. While your treatment has been planned according to the most current medical practices available, unavoidable complications occasionally occur. If you have any problems or questions after discharge, please call your doctor. ACTIVITY You may resume your regular activity but move at a slower pace for the next 24 hours.  Take frequent rest periods for the next 24 hours.  Walking will help expel (get rid of) the air and reduce the bloated feeling in your abdomen.  No driving for 24 hours (because of the anesthesia (medicine) used during the test).  You may shower.  Do not sign any important legal documents or operate any machinery for 24 hours (because of the anesthesia used during the test).  NUTRITION Drink plenty of fluids.  You may resume your normal diet.  Begin with a light meal and progress to your normal diet.  Avoid alcoholic beverages for 24 hours or as instructed by your caregiver.  MEDICATIONS You may resume your normal medications unless your caregiver tells you otherwise.  WHAT YOU CAN EXPECT TODAY You may experience abdominal discomfort such as a feeling of fullness or "gas" pains.  FOLLOW-UP Your doctor will discuss the results of your test with you.  SEEK IMMEDIATE MEDICAL ATTENTION IF ANY OF THE FOLLOWING OCCUR: Excessive nausea (feeling sick to your stomach) and/or vomiting.  Severe abdominal pain and distention (swelling).  Trouble swallowing.  Temperature over 101 F (37.8 C).  Rectal bleeding or vomiting of blood.    Your upper endoscopy revealed a large hiatal hernia.  You did have a stricture at the end portion of your esophagus which I stretched out today.  Hopefully this helps with  feeling of food getting stuck.  One small polyp which I removed. Also took biopsies of your stomach.  We will call with these results.  Continue on omeprazole daily.  Follow-up in GI office in 2 to 3 months.  I hope you have a great rest of your week!  Hennie Duos. Marletta Lor, D.O. [facility]

## 2023-04-29 NOTE — Interval H&P Note (Signed)
History and Physical Interval Note:  04/29/2023 1:03 PM  Jose Chase  has presented today for surgery, with the diagnosis of dysphagia,esophageal stricture on barium study.  The various methods of treatment have been discussed with the patient and family. After consideration of risks, benefits and other options for treatment, the patient has consented to  Procedure(s) with comments: ESOPHAGOGASTRODUODENOSCOPY (EGD) WITH PROPOFOL (N/A) - 1:45 pm, asa 3 BALLOON DILATION (N/A) as a surgical intervention.  The patient's history has been reviewed, patient examined, no change in status, stable for surgery.  I have reviewed the patient's chart and labs.  Questions were answered to the patient's satisfaction.     Lanelle Bal

## 2023-04-29 NOTE — Anesthesia Preprocedure Evaluation (Signed)
Anesthesia Evaluation  Patient identified by MRN, date of birth, ID band Patient awake    Reviewed: Allergy & Precautions, H&P , NPO status , Patient's Chart, lab work & pertinent test results  Airway Mallampati: II  TM Distance: >3 FB Neck ROM: Full    Dental no notable dental hx. (+) Edentulous Upper, Edentulous Lower   Pulmonary shortness of breath and with exertion, former smoker   Pulmonary exam normal breath sounds clear to auscultation       Cardiovascular Normal cardiovascular exam+ dysrhythmias + pacemaker  Rhythm:Regular Rate:Normal  1. Left ventricular ejection fraction, by estimation, is 55 to 60%. The  left ventricle has normal function. The left ventricle has no regional  wall motion abnormalities. There is mild left ventricular hypertrophy.  Left ventricular diastolic parameters  are consistent with Grade I diastolic dysfunction (impaired relaxation).   2. Right ventricular systolic function is normal. The right ventricular  size is normal. There is normal pulmonary artery systolic pressure.   3. The mitral valve is normal in structure. No evidence of mitral valve  regurgitation. No evidence of mitral stenosis.   4. The aortic valve is tricuspid. Aortic valve regurgitation is not  visualized. Aortic valve sclerosis is present, with no evidence of aortic  valve stenosis.   26-Apr-2023 13:04:39 [facility]-OPS ROUTINE RECORD [birth year] (32 yr) Male Black Vent. rate 60 BPM PR interval 186 ms QRS duration 164 ms QT/QTcB 494/494 ms P-R-T axes 66 -83 87 AV dual-paced rhythm Abnormal ECG PREVIOUS ECG IS PRESENT Confirmed by Carylon Perches [id number]) on 04/27/2023 9:24:09 AM   Neuro/Psych  Neuromuscular disease  negative psych ROS   GI/Hepatic Neg liver ROS, hiatal hernia,GERD  Medicated and Controlled,,  Endo/Other  diabetes, Well Controlled, Type 2, Oral Hypoglycemic Agents    Renal/GU negative Renal  ROS  negative genitourinary   Musculoskeletal negative musculoskeletal ROS (+)    Abdominal   Peds negative pediatric ROS (+)  Hematology  (+) Blood dyscrasia, anemia   Anesthesia Other Findings   Reproductive/Obstetrics negative OB ROS                             Anesthesia Physical Anesthesia Plan  ASA: 3  Anesthesia Plan: General   Post-op Pain Management: Minimal or no pain anticipated   Induction: Intravenous  PONV Risk Score and Plan: 1 and Propofol infusion  Airway Management Planned: Nasal Cannula and Natural Airway  Additional Equipment:   Intra-op Plan:   Post-operative Plan:   Informed Consent: I have reviewed the patients History and Physical, chart, labs and discussed the procedure including the risks, benefits and alternatives for the proposed anesthesia with the patient or authorized representative who has indicated his/her understanding and acceptance.     Dental advisory given  Plan Discussed with: CRNA and Surgeon  Anesthesia Plan Comments:         Anesthesia Quick Evaluation

## 2023-04-29 NOTE — Transfer of Care (Signed)
Immediate Anesthesia Transfer of Care Note  Patient: Jose Chase  Procedure(s) Performed: ESOPHAGOGASTRODUODENOSCOPY (EGD) WITH PROPOFOL BALLOON DILATION BIOPSY  Patient Location: Short Stay  Anesthesia Type:General  Level of Consciousness: drowsy  Airway & Oxygen Therapy: Patient Spontanous Breathing  Post-op Assessment: Report given to RN and Post -op Vital signs reviewed and stable  Post vital signs: Reviewed and stable  Last Vitals:  Vitals Value Taken Time  BP 121/58 04/29/23 1349  Temp 36.4 C 04/29/23 1349  Pulse 68 04/29/23 1349  Resp 21 04/29/23 1349  SpO2 100 % 04/29/23 1349    Last Pain:  Vitals:   04/29/23 1349  TempSrc: Axillary  PainSc: 0-No pain      Patients Stated Pain Goal: 5 (04/29/23 1219)  Complications: No notable events documented.

## 2023-04-29 NOTE — Op Note (Signed)
[facility] Patient Name: Jose Chase Procedure Date: 04/29/2023 1:22 PM MRN: [id number] Date of Birth: [birth year] Attending MD: Hennie Duos. Marletta Lor , [place], [id number] CSN: [id number] Age: 89 Admit Type: Outpatient Procedure:                Upper GI endoscopy Indications:              Dysphagia Providers:                Hennie Duos. Marletta Lor, DO, Sheran Fava, Angela A.                            Museum/gallery exhibitions officer, Charity fundraiser, Judeth Cornfield. Jessee Avers, Technician,                            Roseanne Kaufman, Pensions consultant Referring MD:              Medicines:                See the Anesthesia note for documentation of the                            administered medications Complications:            No immediate complications. Estimated Blood Loss:     Estimated blood loss was minimal. Procedure:                Pre-Anesthesia Assessment:                           - The anesthesia plan was to use monitored                            anesthesia care (MAC).                           After obtaining informed consent, the endoscope was                            passed under direct vision. Throughout the                            procedure, the patient's blood pressure, pulse, and                            oxygen saturations were monitored continuously. The                            GIF-H190 ([id number]) scope was introduced through the                            mouth, and advanced to the second part of duodenum.                            The upper GI endoscopy was accomplished without                            difficulty. The  patient tolerated the procedure                            well. Scope In: 1:33:27 PM Scope Out: 1:46:04 PM Total Procedure Duration: 0 hours 12 minutes 37 seconds  Findings:      The examined esophagus was moderately tortuous.      A mild Schatzki ring was found at the gastroesophageal junction. A TTS       dilator was passed through the scope. Dilation with an 18-19-20  mm       balloon dilator was performed to 18 mm. The dilation site was examined       and showed mild mucosal disruption and moderate improvement in luminal       narrowing.      A large hiatal hernia was present.      Diffuse nodular mucosa was found in the entire examined stomach.       Biopsies were taken with a cold forceps for histology.      The duodenal bulb, first portion of the duodenum and second portion of       the duodenum were normal.      A single 4 mm sessile polyp with no stigmata of recent bleeding was       found in the gastric body. The polyp was removed with a cold snare.       Resection and retrieval were complete. Impression:               - Tortuous esophagus.                           - Mild Schatzki ring. Dilated.                           - Large hiatal hernia.                           - Nodular mucosa in the entire stomach. Biopsied.                           - Normal duodenal bulb, first portion of the                            duodenum and second portion of the duodenum.                           - A single gastric polyp. Resected and retrieved. Moderate Sedation:      Per Anesthesia Care Recommendation:           - Patient has a contact number available for                            emergencies. The signs and symptoms of potential                            delayed complications were discussed with the                            patient. Return to normal activities tomorrow.  Written discharge instructions were provided to the                            patient.                           - Continue present medications.                           - Resume previous diet.                           - Repeat upper endoscopy PRN for retreatment.                           - Return to GI clinic in 3 months.                           - Await pathology results. Procedure Code(s):        --- Professional ---                            [phone], Esophagogastroduodenoscopy, flexible,                            transoral; with transendoscopic balloon dilation of                            esophagus (less than 30 mm diameter)                           43239, 59, Esophagogastroduodenoscopy, flexible,                            transoral; with biopsy, single or multiple Diagnosis Code(s):        --- Professional ---                           Q39.9, Congenital malformation of esophagus,                            unspecified                           K22.2, Esophageal obstruction                           K44.9, Diaphragmatic hernia without obstruction or                            gangrene                           K31.89, Other diseases of stomach and duodenum                           R13.10, Dysphagia, unspecified CPT copyright 2022 American Medical Association. All rights reserved. The codes documented in this report  are preliminary and upon coder review may  be revised to meet current compliance requirements. Hennie Duos. Marletta Lor, DO Hennie Duos. Marletta Lor, DO 04/29/2023 1:50:42 PM This report has been signed electronically. Number of Addenda: 0

## 2023-04-30 LAB — SURGICAL PATHOLOGY

## 2023-05-06 ENCOUNTER — Encounter (HOSPITAL_COMMUNITY): Payer: Self-pay | Admitting: Internal Medicine

## 2023-05-06 DIAGNOSIS — N400 Enlarged prostate without lower urinary tract symptoms: Secondary | ICD-10-CM | POA: Diagnosis not present

## 2023-05-06 DIAGNOSIS — I959 Hypotension, unspecified: Secondary | ICD-10-CM | POA: Diagnosis not present

## 2023-05-06 DIAGNOSIS — E785 Hyperlipidemia, unspecified: Secondary | ICD-10-CM | POA: Diagnosis not present

## 2023-05-06 DIAGNOSIS — K219 Gastro-esophageal reflux disease without esophagitis: Secondary | ICD-10-CM | POA: Diagnosis not present

## 2023-05-06 DIAGNOSIS — I442 Atrioventricular block, complete: Secondary | ICD-10-CM | POA: Diagnosis not present

## 2023-05-06 DIAGNOSIS — R001 Bradycardia, unspecified: Secondary | ICD-10-CM | POA: Diagnosis not present

## 2023-05-06 DIAGNOSIS — E1165 Type 2 diabetes mellitus with hyperglycemia: Secondary | ICD-10-CM | POA: Diagnosis not present

## 2023-05-06 DIAGNOSIS — Z0001 Encounter for general adult medical examination with abnormal findings: Secondary | ICD-10-CM | POA: Diagnosis not present

## 2023-05-14 ENCOUNTER — Ambulatory Visit (INDEPENDENT_AMBULATORY_CARE_PROVIDER_SITE_OTHER): Payer: Medicare Other

## 2023-05-14 DIAGNOSIS — I442 Atrioventricular block, complete: Secondary | ICD-10-CM | POA: Diagnosis not present

## 2023-05-14 LAB — CUP PACEART REMOTE DEVICE CHECK
Battery Remaining Longevity: 114 mo
Battery Remaining Percentage: 95 %
Battery Voltage: 3.02 V
Brady Statistic AP VP Percent: 24 %
Brady Statistic AP VS Percent: 1 %
Brady Statistic AS VP Percent: 74 %
Brady Statistic AS VS Percent: 1 %
Brady Statistic RA Percent Paced: 23 %
Brady Statistic RV Percent Paced: 98 %
Date Time Interrogation Session: 20240702040014
Implantable Lead Connection Status: 753985
Implantable Lead Connection Status: 753985
Implantable Lead Implant Date: 20231002
Implantable Lead Implant Date: 20231002
Implantable Lead Location: 753859
Implantable Lead Location: 753860
Implantable Pulse Generator Implant Date: 20231002
Lead Channel Impedance Value: 400 Ohm
Lead Channel Impedance Value: 450 Ohm
Lead Channel Pacing Threshold Amplitude: 0.625 V
Lead Channel Pacing Threshold Amplitude: 0.875 V
Lead Channel Pacing Threshold Pulse Width: 0.5 ms
Lead Channel Pacing Threshold Pulse Width: 0.5 ms
Lead Channel Sensing Intrinsic Amplitude: 11.2 mV
Lead Channel Sensing Intrinsic Amplitude: 2.6 mV
Lead Channel Setting Pacing Amplitude: 1.125
Lead Channel Setting Pacing Amplitude: 1.625
Lead Channel Setting Pacing Pulse Width: 0.5 ms
Lead Channel Setting Sensing Sensitivity: 2 mV
Pulse Gen Model: 2272
Pulse Gen Serial Number: 8115141

## 2023-06-05 DIAGNOSIS — E1165 Type 2 diabetes mellitus with hyperglycemia: Secondary | ICD-10-CM | POA: Diagnosis not present

## 2023-06-05 DIAGNOSIS — D508 Other iron deficiency anemias: Secondary | ICD-10-CM | POA: Diagnosis not present

## 2023-06-07 NOTE — Progress Notes (Signed)
Remote pacemaker transmission.   

## 2023-07-06 DIAGNOSIS — D508 Other iron deficiency anemias: Secondary | ICD-10-CM | POA: Diagnosis not present

## 2023-07-06 DIAGNOSIS — E1165 Type 2 diabetes mellitus with hyperglycemia: Secondary | ICD-10-CM | POA: Diagnosis not present

## 2023-07-09 ENCOUNTER — Encounter: Payer: Self-pay | Admitting: Internal Medicine

## 2023-08-06 DIAGNOSIS — E1165 Type 2 diabetes mellitus with hyperglycemia: Secondary | ICD-10-CM | POA: Diagnosis not present

## 2023-08-06 DIAGNOSIS — D508 Other iron deficiency anemias: Secondary | ICD-10-CM | POA: Diagnosis not present

## 2023-08-13 ENCOUNTER — Ambulatory Visit: Payer: Medicare Other

## 2023-08-13 DIAGNOSIS — I442 Atrioventricular block, complete: Secondary | ICD-10-CM

## 2023-08-14 LAB — CUP PACEART REMOTE DEVICE CHECK
Battery Remaining Longevity: 109 mo
Battery Remaining Percentage: 92 %
Battery Voltage: 3.01 V
Brady Statistic AP VP Percent: 27 %
Brady Statistic AP VS Percent: 1 %
Brady Statistic AS VP Percent: 71 %
Brady Statistic AS VS Percent: 1 %
Brady Statistic RA Percent Paced: 26 %
Brady Statistic RV Percent Paced: 97 %
Date Time Interrogation Session: 20241001054346
Implantable Lead Connection Status: 753985
Implantable Lead Connection Status: 753985
Implantable Lead Implant Date: 20231002
Implantable Lead Implant Date: 20231002
Implantable Lead Location: 753859
Implantable Lead Location: 753860
Implantable Pulse Generator Implant Date: 20231002
Lead Channel Impedance Value: 390 Ohm
Lead Channel Impedance Value: 450 Ohm
Lead Channel Pacing Threshold Amplitude: 0.625 V
Lead Channel Pacing Threshold Amplitude: 1 V
Lead Channel Pacing Threshold Pulse Width: 0.5 ms
Lead Channel Pacing Threshold Pulse Width: 0.5 ms
Lead Channel Sensing Intrinsic Amplitude: 1.6 mV
Lead Channel Sensing Intrinsic Amplitude: 12 mV
Lead Channel Setting Pacing Amplitude: 1.25 V
Lead Channel Setting Pacing Amplitude: 1.625
Lead Channel Setting Pacing Pulse Width: 0.5 ms
Lead Channel Setting Sensing Sensitivity: 2 mV
Pulse Gen Model: 2272
Pulse Gen Serial Number: 8115141

## 2023-08-29 NOTE — Progress Notes (Signed)
Remote pacemaker transmission.   

## 2023-09-05 DIAGNOSIS — D508 Other iron deficiency anemias: Secondary | ICD-10-CM | POA: Diagnosis not present

## 2023-09-05 DIAGNOSIS — E1165 Type 2 diabetes mellitus with hyperglycemia: Secondary | ICD-10-CM | POA: Diagnosis not present

## 2023-10-06 DIAGNOSIS — E1165 Type 2 diabetes mellitus with hyperglycemia: Secondary | ICD-10-CM | POA: Diagnosis not present

## 2023-10-06 DIAGNOSIS — D508 Other iron deficiency anemias: Secondary | ICD-10-CM | POA: Diagnosis not present

## 2023-10-30 DIAGNOSIS — Z1389 Encounter for screening for other disorder: Secondary | ICD-10-CM | POA: Diagnosis not present

## 2023-10-30 DIAGNOSIS — Z950 Presence of cardiac pacemaker: Secondary | ICD-10-CM | POA: Diagnosis not present

## 2023-10-30 DIAGNOSIS — E1165 Type 2 diabetes mellitus with hyperglycemia: Secondary | ICD-10-CM | POA: Diagnosis not present

## 2023-10-30 DIAGNOSIS — I959 Hypotension, unspecified: Secondary | ICD-10-CM | POA: Diagnosis not present

## 2023-10-30 DIAGNOSIS — K219 Gastro-esophageal reflux disease without esophagitis: Secondary | ICD-10-CM | POA: Diagnosis not present

## 2023-10-30 DIAGNOSIS — I951 Orthostatic hypotension: Secondary | ICD-10-CM | POA: Diagnosis not present

## 2023-10-30 DIAGNOSIS — Z23 Encounter for immunization: Secondary | ICD-10-CM | POA: Diagnosis not present

## 2023-10-30 DIAGNOSIS — Z1331 Encounter for screening for depression: Secondary | ICD-10-CM | POA: Diagnosis not present

## 2023-10-30 DIAGNOSIS — I10 Essential (primary) hypertension: Secondary | ICD-10-CM | POA: Diagnosis not present

## 2023-10-30 DIAGNOSIS — E785 Hyperlipidemia, unspecified: Secondary | ICD-10-CM | POA: Diagnosis not present

## 2023-10-30 DIAGNOSIS — I442 Atrioventricular block, complete: Secondary | ICD-10-CM | POA: Diagnosis not present

## 2023-10-30 DIAGNOSIS — N400 Enlarged prostate without lower urinary tract symptoms: Secondary | ICD-10-CM | POA: Diagnosis not present

## 2023-11-12 ENCOUNTER — Ambulatory Visit (INDEPENDENT_AMBULATORY_CARE_PROVIDER_SITE_OTHER): Payer: Medicare Other

## 2023-11-12 DIAGNOSIS — I442 Atrioventricular block, complete: Secondary | ICD-10-CM | POA: Diagnosis not present

## 2023-11-12 LAB — CUP PACEART REMOTE DEVICE CHECK
Battery Remaining Longevity: 103 mo
Battery Remaining Percentage: 89 %
Battery Voltage: 3.01 V
Brady Statistic AP VP Percent: 27 %
Brady Statistic AP VS Percent: 1 %
Brady Statistic AS VP Percent: 70 %
Brady Statistic AS VS Percent: 1 %
Brady Statistic RA Percent Paced: 26 %
Brady Statistic RV Percent Paced: 97 %
Date Time Interrogation Session: 20241231040013
Implantable Lead Connection Status: 753985
Implantable Lead Connection Status: 753985
Implantable Lead Implant Date: 20231002
Implantable Lead Implant Date: 20231002
Implantable Lead Location: 753859
Implantable Lead Location: 753860
Implantable Pulse Generator Implant Date: 20231002
Lead Channel Impedance Value: 390 Ohm
Lead Channel Impedance Value: 430 Ohm
Lead Channel Pacing Threshold Amplitude: 0.75 V
Lead Channel Pacing Threshold Amplitude: 0.875 V
Lead Channel Pacing Threshold Pulse Width: 0.5 ms
Lead Channel Pacing Threshold Pulse Width: 0.5 ms
Lead Channel Sensing Intrinsic Amplitude: 1.1 mV
Lead Channel Sensing Intrinsic Amplitude: 11.4 mV
Lead Channel Setting Pacing Amplitude: 1.125
Lead Channel Setting Pacing Amplitude: 1.75 V
Lead Channel Setting Pacing Pulse Width: 0.5 ms
Lead Channel Setting Sensing Sensitivity: 2 mV
Pulse Gen Model: 2272
Pulse Gen Serial Number: 8115141

## 2023-11-30 DIAGNOSIS — D508 Other iron deficiency anemias: Secondary | ICD-10-CM | POA: Diagnosis not present

## 2023-11-30 DIAGNOSIS — E1165 Type 2 diabetes mellitus with hyperglycemia: Secondary | ICD-10-CM | POA: Diagnosis not present

## 2023-12-03 DIAGNOSIS — E119 Type 2 diabetes mellitus without complications: Secondary | ICD-10-CM | POA: Diagnosis not present

## 2023-12-24 NOTE — Progress Notes (Signed)
Remote pacemaker transmission.

## 2023-12-31 DIAGNOSIS — E1165 Type 2 diabetes mellitus with hyperglycemia: Secondary | ICD-10-CM | POA: Diagnosis not present

## 2023-12-31 DIAGNOSIS — D508 Other iron deficiency anemias: Secondary | ICD-10-CM | POA: Diagnosis not present

## 2024-01-06 NOTE — Progress Notes (Unsigned)
 Cardiology Office Note Date:  01/06/2024  Patient ID:  Jose, Chase Nov 16, 1931, MRN [id number] PCP:  Benetta Spar, MD  Electrophysiologist: Dr. Lalla Brothers    Chief Complaint:  *** annual visit  History of Present Illness: Jose Chase is a 89 y.o. 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 While in the ED, he had multiple ECGs which showed both Mobitz I and Mobitz II. Patient was reportedly without symptoms though orthostatic vital signs appear to have been positive. Decision was made to send patient home with a live telemetry monitor and scheduled outpatient cardiology follow up.  On 9/28, Zio contacted cardiology after recurrent Mobitz I and suspected Mobitz type II occurred. Patient reported symptoms of dizziness and hiccups at the time of this event. Given lack of recurrent symptoms, decision made to continue monitoring.    On the morning of 10/1, Zio again reached out about a concerning heart block pattern. This report was reviewed and showed Mobitz II with a short period of complete heart block noted, HR in the 30s. Patient was contacted and reported symptoms of dizziness and hiccups. He was advised to report to [facility] ED for further management, likely pacemaker. He was admitted and underwent PPM implant Discharged 08/14/22.  I saw him 08/28/22 He is accompanied by his son/wife. He denies any wound concerns No CP, palpitations or cardiac awareness No SOB No near syncope or syncope. He c/w swallowing difficulty and they ask about timing for his esophageal dilation Site was stable Device check reported stable  Admitted 10/03/22 with acute weakness, erratic HRs reported by EMS, hypotensive in the ER > levophed, CXR reviewed and  noting RV lead had dislodged/pulled back  RV lead revised  Discharged 10/04/22  Saw EP last w/Dr. Lalla Brothers 01/08/23 had family with him, device functioning well, mows the yard with his riding mower.  *** symptoms *** devic  Device information Abbott dual chamber PPM implanted 08/13/22 RV lead dislodged, >> lead revision 10/03/22  Past Medical History:  Diagnosis Date   Aspirin long-term use    DM (diabetes mellitus) (HCC)    Dyspnea    Dysrhythmia    GERD (gastroesophageal reflux disease)    Hemorrhoids, internal 10/13/2007   Hiatal hernia DEC 2008 LARGE   Presence of permanent cardiac pacemaker    Prostate cancer [facility])     Past Surgical History:  Procedure Laterality Date   BACK SURGERY     BALLOON DILATION N/A 04/29/2023   Procedure: BALLOON DILATION;  Surgeon: Lanelle Bal, DO;  Location[facility] ENDO [facility];  Service: Endoscopy;  Laterality: N/A;   BIOPSY  04/29/2023   Procedure: BIOPSY;  Surgeon: Lanelle Bal, DO;  Location: [facility];  Service: Endoscopy;;   COLONOSCOPY  DEC 2008   COMPLICATED BY CONTAINED RECTAL PERFORATION   ESOPHAGOGASTRODUODENOSCOPY  05/29/2011   Procedure: ESOPHAGOGASTRODUODENOSCOPY (EGD);  Surgeon: Arlyce Harman, MD;  Location: [facility];  Service: Endoscopy;  Laterality: N/A;  Possible ED   ESOPHAGOGASTRODUODENOSCOPY N/A 02/07/2016   Procedure: ESOPHAGOGASTRODUODENOSCOPY (EGD);  Surgeon: West Bali, MD;  Location: [facility] END[facility];  Service: Endoscopy;  Laterality: N/A;  200 - moved to 3/28 @ 10:30 - office notified pt   ESOPHAGOGASTRODUODENOSCOPY (EGD) WITH  ESOPHAGEAL DILATION N/A 10/20/2013   GNF:AOZHYQMVH at the gastroesophageal junction/ Medium sized hiatal hernia/MILD Erosive gastritis. Biopsy showed chronic inflammation, no H. pylori, fragment of fundic gland polyp   ESOPHAGOGASTRODUODENOSCOPY (EGD) WITH PROPOFOL N/A 04/29/2023   Procedure: ESOPHAGOGASTRODUODENOSCOPY (EGD) WITH PROPOFOL;  Surgeon: Lanelle Bal, DO;  Location: [facility]  ENDO SUITE;  Service: Endoscopy;  Laterality: N/A;  1:45 pm, asa 3   INGUINAL HERNIA REPAIR Right 03/21/2023   Procedure: HERNIA REPAIR INGUINAL ADULT W/ MESH;  Surgeon: Lewie Chamber, DO;  Location: [facility] ORS;  Service: General;  Laterality: Right;   LEAD REVISION/REPAIR N/A 10/03/2022   Procedure: LEAD REVISION/REPAIR;  Surgeon: Lanier Prude, MD;  Location: [facility] INVASIVE CV LAB;  Service: Cardiovascular;  Laterality: N/A;   MALONEY DILATION  05/29/2011   Procedure: Elease Hashimoto DILATION;  Surgeon: Arlyce Harman, MD;  Location[facility];  Service: Endoscopy;  Laterality: N/A;   PACEMAKER IMPLANT N/A 08/13/2022   Procedure: PACEMAKER IMPLANT;  Surgeon: Lanier Prude, MD;  Location: [facility] INVASIVE CV LAB;  Service: Cardiovascular;  Laterality: N/A;   PROSTATE SURGERY     prostate cancer   SAVORY DILATION  05/29/2011   Procedure: SAVORY DILATION;  Surgeon: Arlyce Harman, MD;  [facility];  Service: Endoscopy;  Laterality: N/A;   SAVORY DILATION N/A 02/07/2016   Procedure: SAVORY DILATION;  Surgeon: West Bali, MD;  Location[facility];  Service: Endoscopy;  Laterality: N/A;   UPPER GASTROINTESTINAL ENDOSCOPY  DEC 2008   QI:ONGEXB GASTRIC MUCOSA    Current Outpatient Medications  Medication Sig Dispense Refill   acetaminophen (TYLENOL) 500 MG tablet Take 2 tablets (1,000 mg total) by mouth every 6 (six) hours. 30 tablet 0   bismuth subsalicylate (PEPTO BISMOL) 262 MG/15ML suspension Take 30 mLs by mouth every 6 (six) hours as needed for diarrhea or loose stools.     gabapentin (NEURONTIN) 100 MG capsule Take 100 mg by mouth at bedtime.     glipiZIDE (GLUCOTROL) 10 MG tablet Take 10 mg by mouth daily before breakfast.     loratadine (CLARITIN) 10 MG tablet Take 10 mg by mouth daily.     metFORMIN (GLUCOPHAGE) 850 MG tablet Take 1 tablet (850 mg total) by mouth 2 (two) times daily. 30 tablet 11   Multiple Vitamins-Minerals (MEGA MULTI MEN PO) Take 1 tablet by mouth  3 (three) times a week.     omeprazole (PRILOSEC) 40 MG capsule Take 1 capsule (40 mg total) by mouth daily. 90 capsule 3   simvastatin (ZOCOR) 20 MG tablet Take 20 mg by mouth at bedtime.       tamsulosin (FLOMAX) 0.4 MG CAPS capsule Take 0.4 mg by mouth daily.     No current facility-administered medications for this visit.    Allergies:   Patient has no known allergies.   Social History:  The patient  reports that he quit smoking about 51 years ago. His smoking use included cigarettes. He has never used smokeless tobacco. He reports that he does not drink alcohol and does not use drugs.   Family History:  The patient's family history includes Cancer in his brother, brother, sister, and another family member; Heart disease in his brother and father.  ROS:  Please see the history of present illness.    All other systems are reviewed and otherwise negative.   PHYSICAL EXAM:  VS:  There were no vitals taken for this visit. BMI: There is no height or weight on  file to calculate BMI. Well nourished, well developed, in no acute distress HEENT: normocephalic, atraumatic Neck: no JVD, carotid bruits or masses Cardiac: *** RRR; no significant murmurs, no rubs, or gallops Lungs: *** CTA b/l, no wheezing, rhonchi or rales Abd: soft, nontender MS: no deformity, he is thin w/advanced atrophy Ext: *** no edema Skin: warm and dry, no rash Neuro:  No gross deficits appreciated Psych: euthymic mood, full affect  *** PPM site is stable, no erythema, edema, fluctuation or heat, no tethering or discomfort   EKG:  not done today  Device interrogation done today and reviewed by myself:  *** Battery and lead measurements are good ***  08/13/22: TTE 1. Left ventricular ejection fraction, by estimation, is 55 to 60%. The  left ventricle has normal function. The left ventricle has no regional  wall motion abnormalities. There is mild left ventricular hypertrophy.  Left ventricular diastolic  parameters  are consistent with Grade I diastolic dysfunction (impaired relaxation).   2. Right ventricular systolic function is normal. The right ventricular  size is normal. There is normal pulmonary artery systolic pressure.   3. The mitral valve is normal in structure. No evidence of mitral valve  regurgitation. No evidence of mitral stenosis.   4. The aortic valve is tricuspid. Aortic valve regurgitation is not  visualized. Aortic valve sclerosis is present, with no evidence of aortic  valve stenosis.   Recent Labs: 03/20/2023: Hemoglobin 10.6; Platelets 326 04/26/2023: BUN 13; Creatinine, Ser 1.15; Potassium 4.0; Sodium 139  No results found for requested labs within last 365 days.   CrCl cannot be calculated (Patient's most recent lab result is older than the maximum 21 days allowed.).   Wt Readings from Last 3 Encounters:  04/26/23 145 lb (65.8 kg)  04/15/23 150 lb (68 kg)  04/03/23 148 lb (67.1 kg)     Other studies reviewed: Additional studies/records reviewed today include: summarized above  ASSESSMENT AND PLAN:  PPM *** Intact function *** no programming changes made   Disposition: ***  Current medicines are reviewed at length with the patient today.  The patient did not have any concerns regarding medicines.  Norma Fredrickson, PA-C 01/06/2024 11:40 AM     [facility] [address] [place] [address] [phone] (office)  [phone] (fax)

## 2024-01-09 ENCOUNTER — Encounter: Payer: Self-pay | Admitting: Physician Assistant

## 2024-01-09 ENCOUNTER — Ambulatory Visit: Payer: Medicare Other | Attending: Physician Assistant | Admitting: Physician Assistant

## 2024-01-09 VITALS — BP 124/66 | HR 80 | Ht 72.0 in | Wt 144.6 lb

## 2024-01-09 DIAGNOSIS — Z950 Presence of cardiac pacemaker: Secondary | ICD-10-CM | POA: Diagnosis not present

## 2024-01-09 DIAGNOSIS — I442 Atrioventricular block, complete: Secondary | ICD-10-CM

## 2024-01-09 NOTE — Patient Instructions (Signed)
 Medication Instructions:  No Changes *If you need a refill on your cardiac medications before your next appointment, please call your pharmacy*   Lab Work: No Labs If you have labs (blood work) drawn today and your tests are completely normal, you will receive your results only by: MyChart Message (if you have MyChart) OR A paper copy in the mail If you have any lab test that is abnormal or we need to change your treatment, we will call you to review the results.   Testing/Procedures: No Testing   Follow-Up: At [facility], you and your health needs are our priority.  As part of our continuing mission to provide you with exceptional heart care, we have created designated Provider Care Teams.  These Care Teams include your primary Cardiologist (physician) and Advanced Practice Providers (APPs -  Physician Assistants and Nurse Practitioners) who all work together to provide you with the care you need, when you need it.  We recommend signing up for the patient portal called "MyChart".  Sign up information is provided on this After Visit Summary.  MyChart is used to connect with patients for Virtual Visits (Telemedicine).  Patients are able to view lab/test results, encounter notes, upcoming appointments, etc.  Non-urgent messages can be sent to your provider as well.   To learn more about what you can do with MyChart, go to [email or web address].    Your next appointment:   3 month(s)  Provider:   Steffanie Dunn, MD

## 2024-01-28 DIAGNOSIS — E1165 Type 2 diabetes mellitus with hyperglycemia: Secondary | ICD-10-CM | POA: Diagnosis not present

## 2024-01-28 DIAGNOSIS — D508 Other iron deficiency anemias: Secondary | ICD-10-CM | POA: Diagnosis not present

## 2024-02-11 ENCOUNTER — Ambulatory Visit (INDEPENDENT_AMBULATORY_CARE_PROVIDER_SITE_OTHER): Payer: Medicare Other

## 2024-02-11 DIAGNOSIS — I442 Atrioventricular block, complete: Secondary | ICD-10-CM | POA: Diagnosis not present

## 2024-02-11 LAB — CUP PACEART REMOTE DEVICE CHECK
Battery Remaining Longevity: 100 mo
Battery Remaining Percentage: 86 %
Battery Voltage: 3.01 V
Brady Statistic AP VP Percent: 20 %
Brady Statistic AP VS Percent: 1 %
Brady Statistic AS VP Percent: 79 %
Brady Statistic AS VS Percent: 1 %
Brady Statistic RA Percent Paced: 19 %
Brady Statistic RV Percent Paced: 99 %
Date Time Interrogation Session: 20250401040016
Implantable Lead Connection Status: 753985
Implantable Lead Connection Status: 753985
Implantable Lead Implant Date: 20231002
Implantable Lead Implant Date: 20231002
Implantable Lead Location: 753859
Implantable Lead Location: 753860
Implantable Pulse Generator Implant Date: 20231002
Lead Channel Impedance Value: 380 Ohm
Lead Channel Impedance Value: 400 Ohm
Lead Channel Pacing Threshold Amplitude: 0.75 V
Lead Channel Pacing Threshold Amplitude: 0.875 V
Lead Channel Pacing Threshold Pulse Width: 0.5 ms
Lead Channel Pacing Threshold Pulse Width: 0.5 ms
Lead Channel Sensing Intrinsic Amplitude: 1.1 mV
Lead Channel Sensing Intrinsic Amplitude: 11.4 mV
Lead Channel Setting Pacing Amplitude: 1.125
Lead Channel Setting Pacing Amplitude: 1.75 V
Lead Channel Setting Pacing Pulse Width: 0.5 ms
Lead Channel Setting Sensing Sensitivity: 2 mV
Pulse Gen Model: 2272
Pulse Gen Serial Number: 8115141

## 2024-02-28 DIAGNOSIS — E1165 Type 2 diabetes mellitus with hyperglycemia: Secondary | ICD-10-CM | POA: Diagnosis not present

## 2024-02-28 DIAGNOSIS — D508 Other iron deficiency anemias: Secondary | ICD-10-CM | POA: Diagnosis not present

## 2024-03-26 NOTE — Progress Notes (Signed)
 Remote pacemaker transmission.

## 2024-03-29 DIAGNOSIS — E1165 Type 2 diabetes mellitus with hyperglycemia: Secondary | ICD-10-CM | POA: Diagnosis not present

## 2024-03-29 DIAGNOSIS — D508 Other iron deficiency anemias: Secondary | ICD-10-CM | POA: Diagnosis not present

## 2024-04-09 NOTE — Progress Notes (Unsigned)
  Electrophysiology Office Follow up Visit Note:    Date:  04/09/2024   ID:  Jose, Chase Oct 12, 1932, MRN [id number]  PCP:  Wyvonna Heidelberg, MD  [facility] Cardiologist:  None  [facility] Electrophysiologist:  Boyce Byes, MD    Interval History:     Jose Chase is a 89 y.o. male who presents for a follow up visit.   The patient last saw her in May January 09, 2024.  He has a history of diabetes, esophageal stricture, prostate cancer, heart block with a permanent pacemaker in situ.  Most  His pacemaker was implanted August 13, 2022.  Unfortunately his RV lead dislodged and he underwent revision October 03, 2022.  He has been doing fairly well.  He does report trouble eating that is a chronic/intermittent issue.  He apparently will eat and then the food gets stuck and then he sometimes has to regurgitate the food.  He has a history of esophageal stricture requiring dilation.      Past medical, surgical, social and family history were reviewed.  ROS:   Please see the history of present illness.    All other systems reviewed and are negative.  EKGs/Labs/Other Studies Reviewed:    The following studies were reviewed today:  Apr 10, 2024 in clinic device interrogation personally reviewed Battery and lead parameters stable No significant A-fib burden For true PMT episodes         Physical Exam:    VS:  There were no vitals taken for this visit.    Wt Readings from Last 3 Encounters:  01/09/24 144 lb 9.6 oz (65.6 kg)  04/26/23 145 lb (65.8 kg)  04/15/23 150 lb (68 kg)     GEN: no distress CARD: RRR, No MRG.  CIED pocket well-healed RESP: No IWOB. CTAB.      ASSESSMENT:    No diagnosis found. PLAN:    In order of problems listed above:  #Complete heart block #Permanent pacemaker in situ Device functioning appropriately.  Continue remote monitoring.  #Low body weight I would like his primary care to see him soon to discuss  his body weight and nutritional status.  I am concerned that he may need another EGD to investigate whether or not his esophageal stricture has returned.  Please fax today's office note to Dr. Eldon Greenland.  Follow-up 2 years with EP APP.   Signed, Harvie Liner, MD, [facility], [facility] 04/09/2024 8:23 PM    Electrophysiology [facility]

## 2024-04-10 ENCOUNTER — Encounter: Payer: Self-pay | Admitting: Cardiology

## 2024-04-10 ENCOUNTER — Ambulatory Visit: Payer: Medicare Other | Attending: Cardiology | Admitting: Cardiology

## 2024-04-10 VITALS — BP 142/70 | HR 66 | Ht 72.0 in | Wt 144.0 lb

## 2024-04-10 DIAGNOSIS — Z950 Presence of cardiac pacemaker: Secondary | ICD-10-CM | POA: Insufficient documentation

## 2024-04-10 DIAGNOSIS — I442 Atrioventricular block, complete: Secondary | ICD-10-CM | POA: Diagnosis not present

## 2024-04-10 LAB — CUP PACEART INCLINIC DEVICE CHECK
Battery Remaining Longevity: 100 mo
Battery Voltage: 3.01 V
Brady Statistic RA Percent Paced: 17 %
Brady Statistic RV Percent Paced: 99 %
Date Time Interrogation Session: 20250530111133
Implantable Lead Connection Status: 753985
Implantable Lead Connection Status: 753985
Implantable Lead Implant Date: 20231002
Implantable Lead Implant Date: 20231122
Implantable Lead Location: 753859
Implantable Lead Location: 753860
Implantable Pulse Generator Implant Date: 20231002
Lead Channel Impedance Value: 400 Ohm
Lead Channel Impedance Value: 412.5 Ohm
Lead Channel Pacing Threshold Amplitude: 0.75 V
Lead Channel Pacing Threshold Amplitude: 0.75 V
Lead Channel Pacing Threshold Amplitude: 0.75 V
Lead Channel Pacing Threshold Amplitude: 0.75 V
Lead Channel Pacing Threshold Pulse Width: 0.5 ms
Lead Channel Pacing Threshold Pulse Width: 0.5 ms
Lead Channel Pacing Threshold Pulse Width: 0.5 ms
Lead Channel Pacing Threshold Pulse Width: 0.5 ms
Lead Channel Sensing Intrinsic Amplitude: 0.5 mV
Lead Channel Sensing Intrinsic Amplitude: 10.6 mV
Lead Channel Setting Pacing Amplitude: 1 V
Lead Channel Setting Pacing Amplitude: 1.625
Lead Channel Setting Pacing Pulse Width: 0.5 ms
Lead Channel Setting Sensing Sensitivity: 2 mV
Pulse Gen Model: 2272
Pulse Gen Serial Number: 8115141

## 2024-04-10 NOTE — Patient Instructions (Signed)
 Medication Instructions:  Your physician recommends that you continue on your current medications as directed. Please refer to the Current Medication list given to you today.  *If you need a refill on your cardiac medications before your next appointment, please call your pharmacy*  Follow-Up: At [facility], you and your health needs are our priority.  As part of our continuing mission to provide you with exceptional heart care, our providers are all part of one team.  This team includes your primary Cardiologist (physician) and Advanced Practice Providers or APPs (Physician Assistants and Nurse Practitioners) who all work together to provide you with the care you need, when you need it.  Your next appointment:   2 years  Provider:   You will see one of the following Advanced Practice Providers on your designated Care Team:   Mertha Abrahams, Kennard Pea [address] [place], PA-C Suzann Riddle, NP Creighton Doffing, NP

## 2024-04-12 ENCOUNTER — Ambulatory Visit: Payer: Self-pay | Admitting: Cardiology

## 2024-04-29 DIAGNOSIS — I951 Orthostatic hypotension: Secondary | ICD-10-CM | POA: Diagnosis not present

## 2024-04-29 DIAGNOSIS — Z950 Presence of cardiac pacemaker: Secondary | ICD-10-CM | POA: Diagnosis not present

## 2024-04-29 DIAGNOSIS — N400 Enlarged prostate without lower urinary tract symptoms: Secondary | ICD-10-CM | POA: Diagnosis not present

## 2024-04-29 DIAGNOSIS — K219 Gastro-esophageal reflux disease without esophagitis: Secondary | ICD-10-CM | POA: Diagnosis not present

## 2024-04-29 DIAGNOSIS — E1165 Type 2 diabetes mellitus with hyperglycemia: Secondary | ICD-10-CM | POA: Diagnosis not present

## 2024-05-12 ENCOUNTER — Ambulatory Visit: Payer: Medicare Other

## 2024-05-12 DIAGNOSIS — I442 Atrioventricular block, complete: Secondary | ICD-10-CM

## 2024-05-12 LAB — CUP PACEART REMOTE DEVICE CHECK
Battery Remaining Longevity: 97 mo
Battery Remaining Percentage: 84 %
Battery Voltage: 3.01 V
Brady Statistic AP VP Percent: 21 %
Brady Statistic AP VS Percent: 1 %
Brady Statistic AS VP Percent: 78 %
Brady Statistic AS VS Percent: 1 %
Brady Statistic RA Percent Paced: 20 %
Brady Statistic RV Percent Paced: 98 %
Date Time Interrogation Session: 20250701040016
Implantable Lead Connection Status: 753985
Implantable Lead Connection Status: 753985
Implantable Lead Implant Date: 20231002
Implantable Lead Implant Date: 20231122
Implantable Lead Location: 753859
Implantable Lead Location: 753860
Implantable Pulse Generator Implant Date: 20231002
Lead Channel Impedance Value: 380 Ohm
Lead Channel Impedance Value: 400 Ohm
Lead Channel Pacing Threshold Amplitude: 0.625 V
Lead Channel Pacing Threshold Amplitude: 0.875 V
Lead Channel Pacing Threshold Pulse Width: 0.5 ms
Lead Channel Pacing Threshold Pulse Width: 0.5 ms
Lead Channel Sensing Intrinsic Amplitude: 1.4 mV
Lead Channel Sensing Intrinsic Amplitude: 10.6 mV
Lead Channel Setting Pacing Amplitude: 1.125
Lead Channel Setting Pacing Amplitude: 1.625
Lead Channel Setting Pacing Pulse Width: 0.5 ms
Lead Channel Setting Sensing Sensitivity: 2 mV
Pulse Gen Model: 2272
Pulse Gen Serial Number: 8115141

## 2024-05-13 ENCOUNTER — Ambulatory Visit: Payer: Self-pay | Admitting: Cardiology

## 2024-05-29 DIAGNOSIS — E1165 Type 2 diabetes mellitus with hyperglycemia: Secondary | ICD-10-CM | POA: Diagnosis not present

## 2024-05-29 DIAGNOSIS — D508 Other iron deficiency anemias: Secondary | ICD-10-CM | POA: Diagnosis not present

## 2024-06-29 DIAGNOSIS — D508 Other iron deficiency anemias: Secondary | ICD-10-CM | POA: Diagnosis not present

## 2024-06-29 DIAGNOSIS — E1165 Type 2 diabetes mellitus with hyperglycemia: Secondary | ICD-10-CM | POA: Diagnosis not present

## 2024-06-30 ENCOUNTER — Ambulatory Visit (INDEPENDENT_AMBULATORY_CARE_PROVIDER_SITE_OTHER): Admitting: Audiology

## 2024-06-30 ENCOUNTER — Ambulatory Visit (INDEPENDENT_AMBULATORY_CARE_PROVIDER_SITE_OTHER): Admitting: Otolaryngology

## 2024-06-30 ENCOUNTER — Encounter (INDEPENDENT_AMBULATORY_CARE_PROVIDER_SITE_OTHER): Payer: Self-pay | Admitting: Otolaryngology

## 2024-06-30 VITALS — BP 162/79 | HR 64

## 2024-06-30 DIAGNOSIS — H903 Sensorineural hearing loss, bilateral: Secondary | ICD-10-CM

## 2024-06-30 DIAGNOSIS — H6123 Impacted cerumen, bilateral: Secondary | ICD-10-CM

## 2024-06-30 DIAGNOSIS — H9313 Tinnitus, bilateral: Secondary | ICD-10-CM

## 2024-06-30 NOTE — Progress Notes (Unsigned)
 CC: Bilateral hearing loss, bilateral tinnitus  HPI:  Jose Chase is a 89 y.o. male who presents today complaining of bilateral hearing loss and bilateral tinnitus.  He has been symptomatic for many years.  He describes the tinnitus as a constant high-pitched ringing noise.  He has never worn hearing aids.  He denies any recent otitis media or otitis externa.  Currently he denies any otalgia, otorrhea, or vertigo.  Past Medical History:  Diagnosis Date   Aspirin long-term use    DM (diabetes mellitus) (HCC)    Dyspnea    Dysrhythmia    GERD (gastroesophageal reflux disease)    Hemorrhoids, internal 10/13/2007   Hiatal hernia DEC 2008 LARGE   Presence of permanent cardiac pacemaker    Prostate cancer [facility])     Past Surgical History:  Procedure Laterality Date   BACK SURGERY     BALLOON DILATION N/A 04/29/2023   Procedure: BALLOON DILATION;  Surgeon: Cindie Carlin POUR, DO;  [facility]: [facility];  Service: Endoscopy;  Laterality: N/A;   BIOPSY  04/29/2023   Procedure: BIOPSY;  Surgeon: Cindie Carlin POUR, DO;  [facility];  Service: Endoscopy;;   COLONOSCOPY  DEC 2008   COMPLICATED BY CONTAINED RECTAL PERFORATION   ESOPHAGOGASTRODUODENOSCOPY  05/29/2011   Procedure: ESOPHAGOGASTRODUODENOSCOPY (EGD);  Surgeon: Margo CHRISTELLA Haddock, MD;  [facility];  Service: Endoscopy;  Laterality: N/A;  Possible ED   ESOPHAGOGASTRODUODENOSCOPY N/A 02/07/2016   Procedure: ESOPHAGOGASTRODUODENOSCOPY (EGD);  Surgeon: Margo LITTIE Haddock, MD;  [facility]: [facility];  Service: Endoscopy;  Laterality: N/A;  200 - moved to 3/28 @ 10:30 - office notified pt   ESOPHAGOGASTRODUODENOSCOPY (EGD) WITH ESOPHAGEAL DILATION N/A 10/20/2013   DOQ:Dumprulmz at the gastroesophageal junction/ Medium sized hiatal hernia/MILD Erosive gastritis. Biopsy showed chronic inflammation, no H. pylori, fragment of fundic gland polyp   ESOPHAGOGASTRODUODENOSCOPY (EGD) WITH PROPOFOL  N/A 04/29/2023   Procedure:  ESOPHAGOGASTRODUODENOSCOPY (EGD) WITH PROPOFOL ;  Surgeon: Cindie Carlin POUR, DO;  [facility];  Service: Endoscopy;  Laterality: N/A;  1:45 pm, asa 3   INGUINAL HERNIA REPAIR Right 03/21/2023   Procedure: HERNIA REPAIR INGUINAL ADULT W/ MESH;  Surgeon: Evonnie Dorothyann LABOR, DO;  Location: [facility];  Service: General;  Laterality: Right;   LEAD REVISION/REPAIR N/A 10/03/2022   Procedure: LEAD REVISION/REPAIR;  Surgeon: Cindie Ole DASEN, MD;  Location: [facility];  Service: Cardiovascular;  Laterality: N/A;   MALONEY DILATION  05/29/2011   Procedure: AGAPITO DILATION;  Surgeon: Margo CHRISTELLA Haddock, MD;  [facility];  Service: Endoscopy;  Laterality: N/A;   PACEMAKER IMPLANT N/A 08/13/2022   Procedure: PACEMAKER IMPLANT;  Surgeon: Cindie Ole DASEN, MD;  Location: [facility]IVE CV LAB;  Service: Cardiovascular;  Laterality: N/A;   PROSTATE SURGERY     prostate cancer   SAVORY DILATION  05/29/2011   Procedure: SAVORY DILATION;  Surgeon: Margo CHRISTELLA Haddock, MD;  [facility];  Service: Endoscopy;  Laterality: N/A;   SAVORY DILATION N/A 02/07/2016   Procedure: SAVORY DILATION;  Surgeon: Margo LITTIE Haddock, MD;  [facility];  Service: Endoscopy;  Laterality: N/A;   UPPER GASTROINTESTINAL ENDOSCOPY  DEC 2008   [facility]    Family History  Problem Relation Age of Onset   Heart disease Father    Heart disease Brother    Cancer Brother    Cancer Brother    Cancer Other    Cancer Sister    Colon cancer Neg  Hx    Colon polyps Neg Hx     Social History:  reports that he quit smoking about 51 years ago. His smoking use included cigarettes. He has never used smokeless tobacco. He reports that he does not drink alcohol  and does not use drugs.  Allergies: No Known Allergies  Prior to Admission medications   Medication Sig Start Date End Date Taking? Authorizing Provider  acetaminophen  (TYLENOL ) 500 MG tablet Take 2 tablets (1,000 mg total) by  mouth every 6 (six) hours. 03/21/23  Yes Pappayliou, Dorothyann A, DO  bismuth subsalicylate (PEPTO BISMOL) 262 MG/15ML suspension Take 30 mLs by mouth every 6 (six) hours as needed for diarrhea or loose stools.   Yes [provider]  gabapentin  (NEURONTIN ) 100 MG capsule Take 100 mg by mouth at bedtime. 04/25/22  Yes [provider]  glipiZIDE (GLUCOTROL) 10 MG tablet Take 10 mg by mouth daily before breakfast.   Yes [provider]  loratadine (CLARITIN) 10 MG tablet Take 10 mg by mouth daily.   Yes [provider]  metFORMIN  (GLUCOPHAGE ) 850 MG tablet Take 1 tablet (850 mg total) by mouth 2 (two) times daily. 08/15/22  Yes Riddle, Suzann, NP  Multiple Vitamins-Minerals (MEGA MULTI MEN PO) Take 1 tablet by mouth 3 (three) times a week.   Yes [provider]  omeprazole  (PRILOSEC) 40 MG capsule Take 1 capsule (40 mg total) by mouth daily. 07/13/22  Yes Carver, Charles K, DO  simvastatin  (ZOCOR ) 20 MG tablet Take 20 mg by mouth at bedtime.     Yes [provider]  tamsulosin (FLOMAX) 0.4 MG CAPS capsule Take 0.4 mg by mouth daily.   Yes [provider]    Blood pressure (!) 162/79, pulse 64, SpO2 98%. Exam: General: Communicates without difficulty, well nourished, no acute distress. Head: Normocephalic, no evidence injury, no tenderness, facial buttresses intact without stepoff. Face/sinus: No tenderness to palpation and percussion. Facial movement is normal and symmetric. Eyes: PERRL, EOMI. No scleral icterus, conjunctivae clear. Neuro: CN II exam reveals vision grossly intact.  No nystagmus at any point of gaze. Ears: Auricles well formed without lesions.  Bilateral cerumen impaction.  Nose: External evaluation reveals normal support and skin without lesions.  Dorsum is intact.  Anterior rhinoscopy reveals congested mucosa over anterior aspect of inferior turbinates and intact septum.  No purulence noted. Oral:  Oral cavity and oropharynx are  intact, symmetric, without erythema or edema.  Mucosa is moist without lesions. Neck: Full range of motion without pain.  There is no significant lymphadenopathy.  No masses palpable.  Thyroid bed within normal limits to palpation.  Parotid glands and submandibular glands equal bilaterally without mass.  Trachea is midline. Neuro:  CN 2-12 grossly intact.   Procedure: Bilateral cerumen disimpaction Anesthesia: None Description: Under the operating microscope, the cerumen is carefully removed with a combination of cerumen currette, alligator forceps, and suction catheters.  After the cerumen is removed, the TMs are noted to be normal.  No mass, erythema, or lesions. The patient tolerated the procedure well.    His hearing test shows bilateral symmetric high-frequency sensorineural hearing loss.  Assessment: 1.  Bilateral cerumen impaction.  After the cerumen removal procedure, both tympanic membranes and middle ear spaces are noted to be normal. 2.  Bilateral symmetric high-frequency sensorineural hearing loss, likely secondary to routine presbycusis. 3.  His tinnitus is likely a direct result of his hearing loss.  Plan: 1.  Otomicroscopy with bilateral cerumen disimpaction. 2.  The  physical exam findings and the hearing test results are reviewed with the patient. 3.  The patient is a candidate for hearing amplification.  The hearing aid options are discussed. 4.  The strategies to cope with tinnitus, including the use of masker, hearing aids, tinnitus retraining therapy, and avoidance of caffeine and alcohol  are discussed. 5.  The patient will return for reevaluation in 1 year.  Jose Chase 06/30/2024, 1:51 PM

## 2024-06-30 NOTE — Progress Notes (Unsigned)
  [address], [address] [place], [place] [address] [phone]  Audiological Evaluation    Name: Jose Chase     DOB:   [birth year]      MRN:   [id number]                                                                                     Service Date: 06/30/2024     Accompanied by: unaccompanied   Patient comes today after Dr. Karis, ENT sent a referral for a hearing evaluation due to concerns with hearing loss.   Symptoms Yes Details  Hearing loss  [x]  Perceives hearing in both ears has declined and notices that he struggles understanding others. Previous audiogram was completed at Dr.Teoh's clinic  Tinnitus  [x]  Reported to be perceived in both ears  Ear pain/ infections/pressure  []    Balance problems  []    Noise exposure history  []    Previous ear surgeries  []    Family history of hearing loss  []    Amplification  []    Other  []      Otoscopy: Right ear: Clear external ear canal and notable landmarks visualized on the tympanic membrane. Left ear:  Clear external ear canal and notable landmarks visualized on the tympanic membrane.  Tympanometry: Right ear: Type A- Normal external ear canal volume with normal middle ear pressure and tympanic membrane compliance. Left ear: Type Ad- Normal external ear canal volume with normal middle ear pressure and high tympanic membrane compliance.  Pure tone Audiometry: Right ear- Normal to moderate sensorineural hearing loss from 250 Hz - 8000 Hz. Left ear-  Normal to moderately severe sensorineural hearing loss from 250 Hz - 8000 Hz.  Speech Audiometry: Right ear- Speech Reception Threshold (SRT) was obtained at 35 dBHL. Left ear-Speech Reception Threshold (SRT) was obtained at 30 dBHL.   Word Recognition Score Tested using NU-6 (recorded) Right ear: 72% was obtained at a presentation level of 80 dBHL with contralateral masking which is deemed as  fair. Left ear: 64% was obtained at a presentation level of 80 dBHL with  contralateral masking which is deemed as  poor.   The hearing test results were completed under headphones and results are deemed to be of good reliability. Test technique:  conventional    Recommendations: Follow up with ENT as scheduled for today. Return for a hearing evaluation if concerns with hearing changes arise or per MD recommendation. Consider a communication needs assessment after medical clearance for hearing aids is obtained.   Jose Chase, AUD

## 2024-07-01 DIAGNOSIS — H9313 Tinnitus, bilateral: Secondary | ICD-10-CM | POA: Insufficient documentation

## 2024-07-01 DIAGNOSIS — H903 Sensorineural hearing loss, bilateral: Secondary | ICD-10-CM | POA: Insufficient documentation

## 2024-07-01 DIAGNOSIS — H6123 Impacted cerumen, bilateral: Secondary | ICD-10-CM | POA: Insufficient documentation

## 2024-07-30 DIAGNOSIS — D508 Other iron deficiency anemias: Secondary | ICD-10-CM | POA: Diagnosis not present

## 2024-07-30 DIAGNOSIS — E1165 Type 2 diabetes mellitus with hyperglycemia: Secondary | ICD-10-CM | POA: Diagnosis not present

## 2024-08-11 ENCOUNTER — Ambulatory Visit (INDEPENDENT_AMBULATORY_CARE_PROVIDER_SITE_OTHER): Payer: Medicare Other

## 2024-08-11 DIAGNOSIS — I442 Atrioventricular block, complete: Secondary | ICD-10-CM | POA: Diagnosis not present

## 2024-08-13 LAB — CUP PACEART REMOTE DEVICE CHECK
Battery Remaining Longevity: 94 mo
Battery Remaining Percentage: 81 %
Battery Voltage: 3.01 V
Brady Statistic AP VP Percent: 28 %
Brady Statistic AP VS Percent: 1 %
Brady Statistic AS VP Percent: 71 %
Brady Statistic AS VS Percent: 1 %
Brady Statistic RA Percent Paced: 27 %
Brady Statistic RV Percent Paced: 99 %
Date Time Interrogation Session: 20250930063350
Implantable Lead Connection Status: 753985
Implantable Lead Connection Status: 753985
Implantable Lead Implant Date: 20231002
Implantable Lead Implant Date: 20231122
Implantable Lead Location: 753859
Implantable Lead Location: 753860
Implantable Pulse Generator Implant Date: 20231002
Lead Channel Impedance Value: 400 Ohm
Lead Channel Impedance Value: 430 Ohm
Lead Channel Pacing Threshold Amplitude: 0.75 V
Lead Channel Pacing Threshold Amplitude: 1.625 V
Lead Channel Pacing Threshold Pulse Width: 0.5 ms
Lead Channel Pacing Threshold Pulse Width: 0.5 ms
Lead Channel Sensing Intrinsic Amplitude: 1.6 mV
Lead Channel Sensing Intrinsic Amplitude: 11.4 mV
Lead Channel Setting Pacing Amplitude: 1.75 V
Lead Channel Setting Pacing Amplitude: 1.875
Lead Channel Setting Pacing Pulse Width: 0.5 ms
Lead Channel Setting Sensing Sensitivity: 2 mV
Pulse Gen Model: 2272
Pulse Gen Serial Number: 8115141

## 2024-08-13 NOTE — Progress Notes (Signed)
 Remote PPM Transmission

## 2024-08-14 ENCOUNTER — Ambulatory Visit: Payer: Self-pay | Admitting: Cardiology

## 2024-08-24 DIAGNOSIS — Z23 Encounter for immunization: Secondary | ICD-10-CM | POA: Diagnosis not present

## 2024-08-29 DIAGNOSIS — D508 Other iron deficiency anemias: Secondary | ICD-10-CM | POA: Diagnosis not present

## 2024-08-29 DIAGNOSIS — E1165 Type 2 diabetes mellitus with hyperglycemia: Secondary | ICD-10-CM | POA: Diagnosis not present

## 2024-09-23 DIAGNOSIS — Z23 Encounter for immunization: Secondary | ICD-10-CM | POA: Diagnosis not present

## 2024-09-29 DIAGNOSIS — E1165 Type 2 diabetes mellitus with hyperglycemia: Secondary | ICD-10-CM | POA: Diagnosis not present

## 2024-09-29 DIAGNOSIS — D508 Other iron deficiency anemias: Secondary | ICD-10-CM | POA: Diagnosis not present

## 2024-11-10 ENCOUNTER — Ambulatory Visit (INDEPENDENT_AMBULATORY_CARE_PROVIDER_SITE_OTHER): Payer: Medicare Other

## 2024-11-10 DIAGNOSIS — I442 Atrioventricular block, complete: Secondary | ICD-10-CM | POA: Diagnosis not present

## 2024-11-10 LAB — CUP PACEART REMOTE DEVICE CHECK
Battery Remaining Longevity: 91 mo
Battery Remaining Percentage: 78 %
Battery Voltage: 3.01 V
Brady Statistic AP VP Percent: 30 %
Brady Statistic AP VS Percent: 1 %
Brady Statistic AS VP Percent: 69 %
Brady Statistic AS VS Percent: 1 %
Brady Statistic RA Percent Paced: 29 %
Brady Statistic RV Percent Paced: 99 %
Date Time Interrogation Session: 20251230040014
Implantable Lead Connection Status: 753985
Implantable Lead Connection Status: 753985
Implantable Lead Implant Date: 20231002
Implantable Lead Implant Date: 20231122
Implantable Lead Location: 753859
Implantable Lead Location: 753860
Implantable Pulse Generator Implant Date: 20231002
Lead Channel Impedance Value: 400 Ohm
Lead Channel Impedance Value: 410 Ohm
Lead Channel Pacing Threshold Amplitude: 0.625 V
Lead Channel Pacing Threshold Amplitude: 1 V
Lead Channel Pacing Threshold Pulse Width: 0.5 ms
Lead Channel Pacing Threshold Pulse Width: 0.5 ms
Lead Channel Sensing Intrinsic Amplitude: 0.8 mV
Lead Channel Sensing Intrinsic Amplitude: 9.5 mV
Lead Channel Setting Pacing Amplitude: 1.25 V
Lead Channel Setting Pacing Amplitude: 1.625
Lead Channel Setting Pacing Pulse Width: 0.5 ms
Lead Channel Setting Sensing Sensitivity: 2 mV
Pulse Gen Model: 2272
Pulse Gen Serial Number: 8115141

## 2024-11-11 NOTE — Progress Notes (Signed)
 Remote PPM Transmission

## 2024-11-15 ENCOUNTER — Ambulatory Visit: Payer: Self-pay | Admitting: Student in an Organized Health Care Education/Training Program
